# Patient Record
Sex: Male | Born: 1975 | Hispanic: No | Marital: Married | Smoking: Former smoker
Health system: Southern US, Community
[De-identification: ages and names within clinical notes are randomized; demographics above are authoritative.]

## PROBLEM LIST (undated history)

## (undated) DIAGNOSIS — E785 Hyperlipidemia, unspecified: Secondary | ICD-10-CM

## (undated) DIAGNOSIS — M259 Joint disorder, unspecified: Secondary | ICD-10-CM

## (undated) DIAGNOSIS — K603 Anal fistula: Secondary | ICD-10-CM

## (undated) HISTORY — DX: Hyperlipidemia, unspecified: E78.5

## (undated) HISTORY — PX: TREATMENT FISTULA ANAL: SUR1390

---

## 2002-03-16 ENCOUNTER — Emergency Department (HOSPITAL_COMMUNITY): Admission: EM | Admit: 2002-03-16 | Discharge: 2002-03-16 | Payer: Self-pay | Admitting: Emergency Medicine

## 2013-03-09 HISTORY — PX: RECTAL EXAM UNDER ANESTHESIA: SHX6399

## 2014-02-23 ENCOUNTER — Encounter: Payer: Self-pay | Admitting: Podiatry

## 2014-02-23 ENCOUNTER — Ambulatory Visit (INDEPENDENT_AMBULATORY_CARE_PROVIDER_SITE_OTHER): Payer: Self-pay | Admitting: Podiatry

## 2014-02-23 VITALS — BP 151/79 | HR 80 | Resp 13 | Ht 70.0 in | Wt 230.0 lb

## 2014-02-23 DIAGNOSIS — L03011 Cellulitis of right finger: Secondary | ICD-10-CM

## 2014-02-23 MED ORDER — CEPHALEXIN 500 MG PO CAPS: 500.0000 mg | ORAL_CAPSULE | Freq: Three times a day (TID) | ORAL | Status: DC

## 2014-02-23 NOTE — Patient Instructions (Signed)

## 2014-02-23 NOTE — Progress Notes (Signed)
Subjective:    Patient ID: Brad Hughes, male    DOB: 12/02/1975, 38 y.o.   MRN: [id number]  HPI 38 year old male presents the office today with complaints of right big toe ingrown toenail infection. Patient states that he has noticed some pus as well as redness overlying the inside border of the right big toenail over the last couple weeks. He has been states right to remove the ingrown toenail on his own. Denies any systemic complaints as fevers, chills, nausea, vomiting. The patient also was inquiring about fungal nail treatment due to thickness and discoloration the left big toe as well as the right fourth digit nails. He has been applying Fungi-Nail to the area. No other complaints at this time.   Review of Systems  All other systems reviewed and are negative.      Objective:   Physical Exam AAO x3, NAD DP/PT pulses palpable bilaterally, CRT less than 3 seconds Protective sensation intact with Simms Weinstein monofilament, vibratory sensation intact, Achilles tendon reflex intact There is evidence of ingrowing along the medial aspect of the right hallux nail. There is erythema along the medial nail border as well as purulence expressed from under the nail. There is no ascending cellulitis. No tenderness along the lateral nail border. The left hallux as well as a right fourth digit toenail is hypertrophic, discolored, brittle. No swelling erythema or drainage from the nail site. Remaining nails without pathology. There is a small dome-shaped soft tissue mass just proximal to the right first MTPJ. Subjectively the patient states that the area has Somewhat bigger over the last couple of years. Denies any pain associated with lesion. The color is homogeneous and has regular boarders. Overlying skin is intact. No open lesions or pre-ulcerative lesions. No areas of pinpoint bony tenderness or pain with vibratory sensation. MMT 5/5, ROM WNL     Assessment & Plan:  38 year old male right  medial hallux paronychia; onychomycosis of left hallux and right fourth digit nail -Conservative versus surgical options were discussed the patient including alternatives, risks, complications. -At this time due to infection recommended a partial nail avulsion without chemical matricectomy of the right medial nail border of the hallux. Patient probably can since the procedure. Under sterile conditions a total of 2.5 mL of a one-to-one mixture 2% lidocaine plain and 0.5% Marcaine plain was infiltrated in a hallux block fashion the right foot. Once anesthetized, the skin was prepped in sterile fashion. Next the medial nail border of the right hallux is sharply excised making sure to remove the entire offending nail border. There is found to be an extensive amount of ingrowing along the nail border. There is purulence expressed. Once the nail was removed and the area was debrided no further purulence was expressed and the underlying skin was intact. The area was then copiously irrigated. Silvadene was applied followed by dry sterile dressing. After application a dressing the tourniquet was removed and there is found to be an immediate capillary refill time is digit. Patient tolerated the procedure well without any complications. Postoperative instructions were discussed the patient for which she verbally understood. Prescribed Keflex. Continue to monitor for any clinical signs or symptoms of worsening infection and directed to call the office immediately should any occur or go directly to the emergency room. -Due to increased size of the mass on the proximal right first MTPJ recommended biopsy of the area however we will hold off due to infection of the toe. -Follow-up in 1 week or sooner should  he problems arise. In the meantime, call the office with any questions, concerns, change in symptoms.

## 2014-02-26 ENCOUNTER — Encounter: Payer: Self-pay | Admitting: Podiatry

## 2014-03-08 ENCOUNTER — Ambulatory Visit: Payer: Self-pay | Admitting: Podiatry

## 2014-10-18 ENCOUNTER — Encounter: Payer: Self-pay | Admitting: Podiatry

## 2014-10-18 ENCOUNTER — Ambulatory Visit (INDEPENDENT_AMBULATORY_CARE_PROVIDER_SITE_OTHER): Payer: Self-pay | Admitting: Podiatry

## 2014-10-18 VITALS — BP 106/69 | HR 74 | Resp 16

## 2014-10-18 DIAGNOSIS — L600 Ingrowing nail: Secondary | ICD-10-CM

## 2014-10-18 NOTE — Progress Notes (Signed)
Subjective:     Patient ID: Brad Hughes, male   DOB: 05/03/1975, 39 y.o.   MRN: [id number]  HPI patient states my right big toenail medial border is very tender and it was taken care of with a temporary procedure last year and I need it fixed permanently   Review of Systems     Objective:   Physical Exam Neurovascular status intact with incurvated right hallux medial border that's painful when pressed    Assessment:     Ingrown toenail deformity right hallux medial border with pain    Plan:     Reviewed condition and discussed permanent procedure to fix the corner. Explained risk and patient wants surgery and today I infiltrated 60 mg I can Marcaine mixture remove the medial corner exposed matrix and applied phenol 3 applications 30 seconds followed by alcohol lavage and sterile dressing. Gave instructions on soaks and reappoint

## 2014-10-18 NOTE — Patient Instructions (Signed)

## 2014-10-22 ENCOUNTER — Telehealth: Payer: Self-pay | Admitting: *Deleted

## 2014-10-22 NOTE — Telephone Encounter (Signed)
Left message at [phone] (Home #) for patient to call me back regarding their ingrown toenail procedure that was done on Thursday, October 18, 2014. Waiting for response from patient.

## 2015-03-10 DIAGNOSIS — K603 Anal fistula, unspecified: Secondary | ICD-10-CM

## 2015-03-10 HISTORY — DX: Anal fistula: K60.3

## 2015-03-10 HISTORY — DX: Anal fistula, unspecified: K60.30

## 2015-09-07 DIAGNOSIS — M259 Joint disorder, unspecified: Secondary | ICD-10-CM

## 2015-09-07 HISTORY — DX: Joint disorder, unspecified: M25.9

## 2015-11-12 ENCOUNTER — Other Ambulatory Visit: Payer: Self-pay | Admitting: General Surgery

## 2015-12-23 ENCOUNTER — Encounter (HOSPITAL_BASED_OUTPATIENT_CLINIC_OR_DEPARTMENT_OTHER): Payer: Self-pay | Admitting: *Deleted

## 2015-12-23 NOTE — Progress Notes (Signed)
[phone]-Instructed Npo after Mn.

## 2015-12-25 ENCOUNTER — Encounter (HOSPITAL_BASED_OUTPATIENT_CLINIC_OR_DEPARTMENT_OTHER): Payer: Self-pay | Admitting: *Deleted

## 2015-12-26 ENCOUNTER — Ambulatory Visit (HOSPITAL_BASED_OUTPATIENT_CLINIC_OR_DEPARTMENT_OTHER): Payer: Self-pay | Admitting: Anesthesiology

## 2015-12-26 ENCOUNTER — Ambulatory Visit (HOSPITAL_BASED_OUTPATIENT_CLINIC_OR_DEPARTMENT_OTHER)
Admission: RE | Admit: 2015-12-26 | Discharge: 2015-12-26 | Disposition: A | Discharge status: Home / Self Care | Payer: Self-pay | Source: Ambulatory Visit | Attending: General Surgery | Admitting: General Surgery

## 2015-12-26 ENCOUNTER — Encounter (HOSPITAL_BASED_OUTPATIENT_CLINIC_OR_DEPARTMENT_OTHER): Payer: Self-pay

## 2015-12-26 ENCOUNTER — Encounter (HOSPITAL_BASED_OUTPATIENT_CLINIC_OR_DEPARTMENT_OTHER)
Admission: RE | Disposition: A | Discharge status: Home / Self Care | Payer: Self-pay | Source: Ambulatory Visit | Attending: General Surgery

## 2015-12-26 DIAGNOSIS — K603 Anal fistula: Secondary | ICD-10-CM | POA: Insufficient documentation

## 2015-12-26 DIAGNOSIS — Z87891 Personal history of nicotine dependence: Secondary | ICD-10-CM | POA: Insufficient documentation

## 2015-12-26 DIAGNOSIS — Z86010 Personal history of colonic polyps: Secondary | ICD-10-CM | POA: Insufficient documentation

## 2015-12-26 DIAGNOSIS — E7800 Pure hypercholesterolemia, unspecified: Secondary | ICD-10-CM | POA: Insufficient documentation

## 2015-12-26 HISTORY — PX: ANAL FISTULOTOMY: SHX6423

## 2015-12-26 HISTORY — PX: RECTAL EXAM UNDER ANESTHESIA: SHX6399

## 2015-12-26 HISTORY — DX: Anal fistula: K60.3

## 2015-12-26 HISTORY — DX: Joint disorder, unspecified: M25.9

## 2015-12-26 LAB — POCT HEMOGLOBIN-HEMACUE: Hemoglobin: 14 g/dL (ref 13.0–17.0)

## 2015-12-26 SURGERY — EXAM UNDER ANESTHESIA, RECTUM
Anesthesia: Monitor Anesthesia Care | Site: Rectum

## 2015-12-26 MED ORDER — FENTANYL CITRATE (PF) 100 MCG/2ML IJ SOLN
25.0000 ug | INTRAMUSCULAR | Status: DC | PRN
  Filled 2015-12-26: qty 1

## 2015-12-26 MED ORDER — SODIUM CHLORIDE 0.9% FLUSH
3.0000 mL | Freq: Two times a day (BID) | INTRAVENOUS | Status: DC
  Filled 2015-12-26: qty 3

## 2015-12-26 MED ORDER — DEXAMETHASONE SODIUM PHOSPHATE 10 MG/ML IJ SOLN
INTRAMUSCULAR | Status: AC
  Filled 2015-12-26: qty 1

## 2015-12-26 MED ORDER — PROPOFOL 500 MG/50ML IV EMUL
INTRAVENOUS | Status: DC | PRN
  Administered 2015-12-26: 50 ug/kg/min via INTRAVENOUS

## 2015-12-26 MED ORDER — ACETAMINOPHEN 650 MG RE SUPP
650.0000 mg | RECTAL | Status: DC | PRN
  Filled 2015-12-26: qty 1

## 2015-12-26 MED ORDER — MIDAZOLAM HCL 2 MG/2ML IJ SOLN
INTRAMUSCULAR | Status: AC
  Filled 2015-12-26: qty 2

## 2015-12-26 MED ORDER — KETOROLAC TROMETHAMINE 30 MG/ML IJ SOLN
INTRAMUSCULAR | Status: DC | PRN
  Administered 2015-12-26: 30 mg via INTRAVENOUS

## 2015-12-26 MED ORDER — OXYCODONE HCL 5 MG PO TABS
5.0000 mg | ORAL_TABLET | ORAL | Status: DC | PRN
  Filled 2015-12-26: qty 2

## 2015-12-26 MED ORDER — KETOROLAC TROMETHAMINE 30 MG/ML IJ SOLN
INTRAMUSCULAR | Status: AC
  Filled 2015-12-26: qty 1

## 2015-12-26 MED ORDER — PROPOFOL 500 MG/50ML IV EMUL
INTRAVENOUS | Status: AC
  Filled 2015-12-26: qty 50

## 2015-12-26 MED ORDER — ONDANSETRON HCL 4 MG/2ML IJ SOLN
INTRAMUSCULAR | Status: DC | PRN
  Administered 2015-12-26: 4 mg via INTRAVENOUS

## 2015-12-26 MED ORDER — FENTANYL CITRATE (PF) 100 MCG/2ML IJ SOLN
INTRAMUSCULAR | Status: AC
  Filled 2015-12-26: qty 2

## 2015-12-26 MED ORDER — HYDROCODONE-ACETAMINOPHEN 7.5-325 MG PO TABS
1.0000 | ORAL_TABLET | Freq: Once | ORAL | Status: DC | PRN
  Filled 2015-12-26: qty 1

## 2015-12-26 MED ORDER — PROPOFOL 10 MG/ML IV BOLUS
INTRAVENOUS | Status: DC | PRN
  Administered 2015-12-26: 20 mg via INTRAVENOUS

## 2015-12-26 MED ORDER — FENTANYL CITRATE (PF) 100 MCG/2ML IJ SOLN
INTRAMUSCULAR | Status: DC | PRN
  Administered 2015-12-26: 25 ug via INTRAVENOUS
  Administered 2015-12-26: 50 ug via INTRAVENOUS
  Administered 2015-12-26: 25 ug via INTRAVENOUS

## 2015-12-26 MED ORDER — SODIUM CHLORIDE 0.9% FLUSH
3.0000 mL | INTRAVENOUS | Status: DC | PRN
  Filled 2015-12-26: qty 3

## 2015-12-26 MED ORDER — LIDOCAINE 5 % EX OINT
TOPICAL_OINTMENT | CUTANEOUS | Status: DC | PRN
  Administered 2015-12-26: 1

## 2015-12-26 MED ORDER — BUPIVACAINE-EPINEPHRINE 0.5% -1:200000 IJ SOLN
INTRAMUSCULAR | Status: DC | PRN
  Administered 2015-12-26: 50 mL

## 2015-12-26 MED ORDER — ACETAMINOPHEN 325 MG PO TABS
650.0000 mg | ORAL_TABLET | ORAL | Status: DC | PRN
  Filled 2015-12-26: qty 2

## 2015-12-26 MED ORDER — DEXAMETHASONE SODIUM PHOSPHATE 4 MG/ML IJ SOLN
INTRAMUSCULAR | Status: DC | PRN
  Administered 2015-12-26: 5 mg via INTRAVENOUS

## 2015-12-26 MED ORDER — ONDANSETRON HCL 4 MG/2ML IJ SOLN
INTRAMUSCULAR | Status: AC
  Filled 2015-12-26: qty 2

## 2015-12-26 MED ORDER — LIDOCAINE 2% (20 MG/ML) 5 ML SYRINGE
INTRAMUSCULAR | Status: AC
  Filled 2015-12-26: qty 5

## 2015-12-26 MED ORDER — HYDROCODONE-ACETAMINOPHEN 5-325 MG PO TABS: 1.0000 | ORAL_TABLET | ORAL | 0 refills | Status: DC | PRN

## 2015-12-26 MED ORDER — LACTATED RINGERS IV SOLN
INTRAVENOUS | Status: DC
  Administered 2015-12-26: 08:00:00 via INTRAVENOUS
  Filled 2015-12-26: qty 1000

## 2015-12-26 MED ORDER — MIDAZOLAM HCL 5 MG/5ML IJ SOLN
INTRAMUSCULAR | Status: DC | PRN
  Administered 2015-12-26: 2 mg via INTRAVENOUS

## 2015-12-26 MED ORDER — LIDOCAINE HCL (CARDIAC) 20 MG/ML IV SOLN
INTRAVENOUS | Status: DC | PRN
  Administered 2015-12-26: 50 mg via INTRAVENOUS

## 2015-12-26 MED ORDER — SODIUM CHLORIDE 0.9 % IV SOLN
250.0000 mL | INTRAVENOUS | Status: DC | PRN
  Filled 2015-12-26: qty 250

## 2015-12-26 SURGICAL SUPPLY — 56 items
APL SKNCLS STERI-STRIP NONHPOA (GAUZE/BANDAGES/DRESSINGS) ×2
BENZOIN TINCTURE PRP APPL 2/3 (GAUZE/BANDAGES/DRESSINGS) ×4 IMPLANT
BLADE HEX COATED 2.75 (ELECTRODE) ×4 IMPLANT
BLADE SURG 10 STRL SS (BLADE) ×4 IMPLANT
BLADE SURG 15 STRL LF DISP TIS (BLADE) ×2 IMPLANT
BLADE SURG 15 STRL SS (BLADE) ×4
BRIEF STRETCH FOR OB PAD LRG (UNDERPADS AND DIAPERS) ×8 IMPLANT
CANISTER SUCTION 2500CC (MISCELLANEOUS) ×4 IMPLANT
COVER BACK TABLE 60X90IN (DRAPES) ×4 IMPLANT
COVER MAYO STAND STRL (DRAPES) ×4 IMPLANT
DECANTER SPIKE VIAL GLASS SM (MISCELLANEOUS) ×4 IMPLANT
DRAPE LAPAROTOMY 100X72 PEDS (DRAPES) ×4 IMPLANT
DRAPE LG THREE QUARTER DISP (DRAPES) ×8 IMPLANT
DRAPE UNDERBUTTOCKS STRL (DRAPE) IMPLANT
DRAPE UTILITY XL STRL (DRAPES) ×4 IMPLANT
DRSG PAD ABDOMINAL 8X10 ST (GAUZE/BANDAGES/DRESSINGS) ×2 IMPLANT
ELECT BLADE 6.5 .24CM SHAFT (ELECTRODE) IMPLANT
ELECT REM PT RETURN 9FT ADLT (ELECTROSURGICAL) ×4
ELECTRODE REM PT RTRN 9FT ADLT (ELECTROSURGICAL) ×2 IMPLANT
GAUZE SPONGE 4X4 12PLY STRL (GAUZE/BANDAGES/DRESSINGS) ×2 IMPLANT
GAUZE SPONGE 4X4 16PLY XRAY LF (GAUZE/BANDAGES/DRESSINGS) IMPLANT
GAUZE VASELINE 3X9 (GAUZE/BANDAGES/DRESSINGS) IMPLANT
GLOVE BIO SURGEON STRL SZ 6.5 (GLOVE) ×6 IMPLANT
GLOVE BIO SURGEONS STRL SZ 6.5 (GLOVE) ×2
GLOVE INDICATOR 7.0 STRL GRN (GLOVE) ×8 IMPLANT
GOWN STRL REUS W/ TWL LRG LVL3 (GOWN DISPOSABLE) ×2 IMPLANT
GOWN STRL REUS W/ TWL XL LVL3 (GOWN DISPOSABLE) ×4 IMPLANT
GOWN STRL REUS W/TWL LRG LVL3 (GOWN DISPOSABLE) ×4
GOWN STRL REUS W/TWL XL LVL3 (GOWN DISPOSABLE) ×8
KIT ROOM TURNOVER WOR (KITS) ×4 IMPLANT
LEGGING LITHOTOMY PAIR STRL (DRAPES) IMPLANT
LOOP VESSEL MAXI BLUE (MISCELLANEOUS) IMPLANT
NDL SAFETY ECLIPSE 18X1.5 (NEEDLE) IMPLANT
NEEDLE HYPO 18GX1.5 SHARP (NEEDLE)
NEEDLE HYPO 22GX1.5 SAFETY (NEEDLE) ×4 IMPLANT
NS IRRIG 500ML POUR BTL (IV SOLUTION) ×4 IMPLANT
PACK BASIN DAY SURGERY FS (CUSTOM PROCEDURE TRAY) ×4 IMPLANT
PAD ABD 8X10 STRL (GAUZE/BANDAGES/DRESSINGS) IMPLANT
PAD ARMBOARD 7.5X6 YLW CONV (MISCELLANEOUS) IMPLANT
PENCIL BUTTON HOLSTER BLD 10FT (ELECTRODE) ×4 IMPLANT
SPONGE GAUZE 4X4 12PLY (GAUZE/BANDAGES/DRESSINGS) IMPLANT
SPONGE SURGIFOAM ABS GEL 12-7 (HEMOSTASIS) IMPLANT
SUT CHROMIC 2 0 SH (SUTURE) IMPLANT
SUT CHROMIC 3 0 SH 27 (SUTURE) IMPLANT
SUT ETHIBOND 0 (SUTURE) IMPLANT
SUT GUT CHROMIC 3 0 (SUTURE) IMPLANT
SUT SILK 3 0 SH CR/8 (SUTURE) ×4 IMPLANT
SUT VIC AB 4-0 P-3 18XBRD (SUTURE) IMPLANT
SUT VIC AB 4-0 P3 18 (SUTURE)
SUT VICRYL 3-0 CR8 SH (SUTURE) ×4 IMPLANT
SYR CONTROL 10ML LL (SYRINGE) ×4 IMPLANT
TOWEL OR 17X24 6PK STRL BLUE (TOWEL DISPOSABLE) ×4 IMPLANT
TRAY DSU PREP LF (CUSTOM PROCEDURE TRAY) ×4 IMPLANT
TUBE CONNECTING 12'X1/4 (SUCTIONS) ×1
TUBE CONNECTING 12X1/4 (SUCTIONS) ×3 IMPLANT
YANKAUER SUCT BULB TIP NO VENT (SUCTIONS) ×4 IMPLANT

## 2015-12-26 NOTE — Anesthesia Postprocedure Evaluation (Signed)
Anesthesia Post Note  Patient: Neco F Patino  Procedure(s) Performed: Procedure(s) (LRB): RECTAL EXAM UNDER ANESTHESIA (N/A) FISTULOTOMY (N/A)  Patient location during evaluation: PACU Anesthesia Type: MAC Level of consciousness: awake and alert Pain management: pain level controlled Vital Signs Assessment: post-procedure vital signs reviewed and stable Respiratory status: spontaneous breathing, nonlabored ventilation, respiratory function stable and patient connected to nasal cannula oxygen Cardiovascular status: stable and blood pressure returned to baseline Anesthetic complications: no    Last Vitals:  Vitals:   12/26/15 0905 12/26/15 1049  BP: 125/73 130/74  Pulse: 80 73  Resp: 18 (!) 22  Temp: 36.4 C     Last Pain:  Vitals:   12/26/15 0710  TempSrc: Oral                 Kennieth RadFitzgerald, Paulene Tayag E

## 2015-12-26 NOTE — Op Note (Signed)
12/26/2015  8:58 AM  PATIENT:  Brad Hughes  [age] y.o. male  Patient Care Team: No Pcp Per Patient as PCP - General (General Practice)  PRE-OPERATIVE DIAGNOSIS:  Anal fistula  POST-OPERATIVE DIAGNOSIS:  Anal fistula  PROCEDURE:  RECTAL EXAM UNDER ANESTHESIA FISTULOTOMY   Surgeon(s): Romie LeveeAlicia Tatyana Biber, MD  ASSISTANT: none   ANESTHESIA:   local and MAC  SPECIMEN:  No Specimen  DISPOSITION OF SPECIMEN:  N/A  COUNTS:  YES  PLAN OF CARE: Discharge to home after PACU  PATIENT DISPOSITION:  PACU - hemodynamically stable.  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   OR FINDINGS: Shallow intersphincteric fistula, left lateral perianal region  DESCRIPTION: the patient was identified in the preoperative holding area and taken to the OR where they were laid on the operating room table.  MAC anesthesia was induced without difficulty. The patient was then positioned in prone jackknife position with buttocks gently taped apart.  The patient was then prepped and draped in usual sterile fashion.  SCDs were noted to be in place prior to the initiation of anesthesia. A surgical timeout was performed indicating the correct patient, procedure, positioning and need for preoperative antibiotics.  A rectal block was performed using Marcaine with epinephrine.    I began with a digital rectal exam.  There was an obvious defect noted in the left lateral perianal region consistent with chronic fistula.  I then placed a Hill-Ferguson anoscope into the anal canal and evaluated this completely.  Anal canal was relatively normal. There was no specter hypertension. There was mild hemorrhoid disease. I placed an S-shaped fistula probe into the external opening of this easily exited inside the anal canal in a radial fashion. It did not involve any external sphincter. I decided to perform a fistulotomy. This was done using electrocautery. The edges  of the fistulotomy site were then marsupialized using a running 2-0 chromic suture. Hemostasis was good at the end of the case. All counts were correct per operating room staff. The patient was sent to the postanesthesia care unit in stable condition.

## 2015-12-26 NOTE — H&P (Signed)
The patient is a 40 year old male who presents with anal fistula. 40 year old male with approximate 5 year history of anal pain and drainage. He had one surgery for this approximately 5 years ago where the area was opened and allowed to heal by secondary intention. It is unclear if the fistulotomy was performed as well. Since that time he has had continued pain and drainage in the area. This occurs on a daily basis. It is relieved with ibuprofen. Patient denies any history of incontinence currently. He has occasional loose stools.   Other Problems Michel Bickers(Kelly Dockery, LPN; 1/6/10969/07/2015 0:459:09 AM) Back Pain Hemorrhoids Hypercholesterolemia  Past Surgical History Michel Bickers(Kelly Dockery, LPN; 4/0/98119/07/2015 9:149:09 AM) Anal Fissure Repair Colon Polyp Removal - Colonoscopy  Diagnostic Studies History Michel Bickers(Kelly Dockery, LPN; 7/8/29569/07/2015 2:139:09 AM) Colonoscopy 5-10 years ago  Allergies Michel Bickers(Kelly Dockery, LPN; 0/8/65789/07/2015 4:699:10 AM) No Known Drug Allergies09/07/2015  Medication History Michel Bickers(Kelly Dockery, LPN; 6/2/95289/07/2015 4:139:10 AM) Ibuprofen (200MG  Capsule, Oral as needed) Active. Medications Reconciled  Social History Michel Bickers(Kelly Dockery, LPN; 2/4/40109/07/2015 2:729:09 AM) Alcohol use Remotely quit alcohol use. Caffeine use Carbonated beverages, Coffee, Tea. Illicit drug use Remotely quit drug use. Tobacco use Former smoker.  Family History Michel Bickers(Kelly Dockery, LPN; 5/3/66449/07/2015 0:349:09 AM) Alcohol Abuse Brother. Diabetes Mellitus Brother, Father, Mother.    Review of Systems  General Present- Fatigue and Weight Gain. Not Present- Appetite Loss, Chills, Fever, Night Sweats and Weight Loss. Skin Present- Change in Wart/Mole. Not Present- Dryness, Hives, Jaundice, New Lesions, Non-Healing Wounds, Rash and Ulcer. HEENT Not Present- Earache, Hearing Loss, Hoarseness, Nose Bleed, Oral Ulcers, Ringing in the Ears, Seasonal Allergies, Sinus Pain, Sore Throat, Visual Disturbances, Wears glasses/contact lenses and Yellow Eyes. Respiratory  Present- Snoring. Not Present- Bloody sputum, Chronic Cough, Difficulty Breathing and Wheezing. Breast Not Present- Breast Mass, Breast Pain, Nipple Discharge and Skin Changes. Cardiovascular Present- Shortness of Breath. Not Present- Chest Pain, Difficulty Breathing Lying Down, Leg Cramps, Palpitations, Rapid Heart Rate and Swelling of Extremities. Gastrointestinal Present- Bloody Stool, Constipation, Difficulty Swallowing, Hemorrhoids and Rectal Pain. Not Present- Abdominal Pain, Bloating, Change in Bowel Habits, Chronic diarrhea, Excessive gas, Gets full quickly at meals, Indigestion, Nausea and Vomiting. Male Genitourinary Present- Impotence. Not Present- Blood in Urine, Change in Urinary Stream, Frequency, Nocturia, Painful Urination, Urgency and Urine Leakage. Musculoskeletal Present- Muscle Weakness and Swelling of Extremities. Not Present- Back Pain, Joint Pain, Joint Stiffness and Muscle Pain. Neurological Present- Numbness. Not Present- Decreased Memory, Fainting, Headaches, Seizures, Tingling, Tremor, Trouble walking and Weakness. Psychiatric Not Present- Anxiety, Bipolar, Change in Sleep Pattern, Depression, Fearful and Frequent crying. Endocrine Not Present- Cold Intolerance, Excessive Hunger, Hair Changes, Heat Intolerance, Hot flashes and New Diabetes. Hematology Not Present- Blood Thinners, Easy Bruising, Excessive bleeding, Gland problems, HIV and Persistent Infections.  BP 122/78   Pulse 72   Temp 97.7 F (36.5 C) (Oral)   Resp 20   Ht 5\' 10"  (1.778 m)   Wt 120.2 kg (265 lb)   SpO2 99%   BMI 38.02 kg/m    Physical Exam  General Mental Status-Alert. General Appearance-Not in acute distress. Build & Nutrition-Well nourished. Posture-Normal posture. Gait-Normal.  Head and Neck Head-normocephalic, atraumatic with no lesions or palpable masses. Trachea-midline.  Chest and Lung Exam Chest and lung exam reveals -on auscultation, normal breath sounds, no  adventitious sounds and normal vocal resonance.  Cardiovascular Cardiovascular examination reveals -normal heart sounds, regular rate and rhythm with no murmurs.  Abdomen Inspection Inspection of the abdomen reveals - No Hernias. Palpation/Percussion Palpation  and Percussion of the abdomen reveal - Soft, Non Tender, No Rigidity (guarding), No hepatosplenomegaly and No Palpable abdominal masses.  Rectal Anorectal Exam External - Note: left lateral anal lesion c/w fistula.  Neurologic Neurologic evaluation reveals -alert and oriented x 3 with no impairment of recent or remote memory, normal attention span and ability to concentrate, normal sensation and normal coordination.  Musculoskeletal Normal Exam - Bilateral-Upper Extremity Strength Normal and Lower Extremity Strength Normal.    Assessment & Plan ANAL FISTULA (K60.3) Impression: 40 year old male who presents to the office with approximately 5 year history of perianal pain and drainage. On exam he appears to have a left lateral fistula site. I have recommended an exam under anesthesia and fistulotomy versus seton placement. We have discussed this in detail. He understands there is a small risk of incontinence if a fistultomy is performed.

## 2015-12-26 NOTE — Discharge Instructions (Addendum)
Post Anesthesia Home Care Instructions  Activity: Get plenty of rest for the remainder of the day. A responsible adult should stay with you for 24 hours following the procedure.  For the next 24 hours, DO NOT: -Drive a car -Operate machinery -Drink alcoholic beverages -Take any medication unless instructed by your physician -Make any legal decisions or sign important papers.  Meals: Start with liquid foods such as gelatin or soup. Progress to regular foods as tolerated. Avoid greasy, spicy, heavy foods. If nausea and/or vomiting occur, drink only clear liquids until the nausea and/or vomiting subsides. Call your physician if vomiting continues.  Special Instructions/Symptoms: Your throat may feel dry or sore from the anesthesia or the breathing tube placed in your throat during surgery. If this causes discomfort, gargle with warm salt water. The discomfort should disappear within 24 hours.  If you had a scopolamine patch placed behind your ear for the management of post- operative nausea and/or vomiting:  1. The medication in the patch is effective for 72 hours, after which it should be removed.  Wrap patch in a tissue and discard in the trash. Wash hands thoroughly with soap and water. 2. You may remove the patch earlier than 72 hours if you experience unpleasant side effects which may include dry mouth, dizziness or visual disturbances. 3. Avoid touching the patch. Wash your hands with soap and water after contact with the patch.   ANORECTAL SURGERY: POST OP INSTRUCTIONS 1. Take your usually prescribed home medications unless otherwise directed. 2. DIET: During the first few hours after surgery sip on some liquids until you are able to urinate.  It is normal to not urinate for several hours after this surgery.  If you feel uncomfortable, please contact the office for instructions.  After you are able to urinate,you may eat, if you feel like it.  Follow a light bland diet the first 24  hours after arrival home, such as soup, liquids, crackers, etc.  Be sure to include lots of fluids daily (6-8 glasses).  Avoid fast food or heavy meals, as your are more likely to get nauseated.  Eat a low fat diet the next few days after surgery.  Limit caffeine intake to 1-2 servings a day. 3. PAIN CONTROL: a. Pain is best controlled by a usual combination of several different methods TOGETHER: i. Muscle relaxation: Soak in a warm bath (or Sitz bath) three times a day and after bowel movements.  Continue to do this until all pain is resolved. ii. Over the counter pain medication iii. Prescription pain medication b. Most patients will experience some swelling and discomfort in the anus/rectal area and incisions.  Heat such as warm towels, sitz baths, warm baths, etc to help relax tight/sore spots and speed recovery.  Some people prefer to use ice, especially in the first couple days after surgery, as it may decrease the pain and swelling, or alternate between ice & heat.  Experiment to what works for you.  Swelling and bruising can take several weeks to resolve.  Pain can take even longer to completely resolve. c. It is helpful to take an over-the-counter pain medication regularly for the first few weeks.  Choose one of the following that works best for you: i. Naproxen (Aleve, etc)  Two 220mg tabs twice a day ii. Ibuprofen (Advil, etc) Three 200mg tabs four times a day (every meal & bedtime) d. A  prescription for pain medication (such as percocet, oxycodone, hydrocodone, etc) should be given to you upon   discharge.  Take your pain medication as prescribed.  i. If you are having problems/concerns with the prescription medicine (does not control pain, nausea, vomiting, rash, itching, etc), please call us [phone] to see if we need to switch you to a different pain medicine that will work better for you and/or control your side effect better. ii. If you need a refill on your pain medication, please  contact your pharmacy.  They will contact our office to request authorization. Prescriptions will not be filled after 5 pm or on week-ends. 4. KEEP YOUR BOWELS REGULAR and AVOID CONSTIPATION a. The goal is one to two soft bowel movements a day.  You should at least have a bowel movement every other day. b. Avoid getting constipated.  Between the surgery and the pain medications, it is common to experience some constipation. This can be very painful after rectal surgery.  Increasing fluid intake and taking a fiber supplement (such as Metamucil, Citrucel, FiberCon, etc) 1-2 times a day regularly will usually help prevent this problem from occurring.  A stool softener like colace is also recommended.  This can be purchased over the counter at your pharmacy.  You can take it up to 3 times a day.  If you do not have a bowel movement after 24 hrs since your surgery, take one does of milk of magnesia.  If you still haven't had a bowel movement 8-12 hours after that dose, take another dose.  If you don't have a bowel movement 48 hrs after surgery, purchase a Fleets enema from the drug store and administer gently per package instructions.  If you still are having trouble with your bowel movements after that, please call the office for further instructions. c. If you develop diarrhea or have many loose bowel movements, simplify your diet to bland foods & liquids for a few days.  Stop any stool softeners and decrease your fiber supplement.  Switching to mild anti-diarrheal medications (Kayopectate, Pepto Bismol) can help.  If this worsens or does not improve, please call us.  5. Wound Care a. Remove your bandages before your first bowel movement or 8 hours after surgery.     b. Remove any wound packing material at this tim,e as well.  You do not need to repack the wound unless instructed otherwise.  Wear an absorbent pad or soft cotton gauze in your underwear to catch any drainage and help keep the area clean. You  should change this every 2-3 hours while awake. c. Keep the area clean and dry.  Bathe / shower every day, especially after bowel movements.  Keep the area clean by showering / bathing over the incision / wound.   It is okay to soak an open wound to help wash it.  Wet wipes or showers / gentle washing after bowel movements is often less traumatic than regular toilet paper. d. You may have some styrofoam-like soft packing in the rectum which will come out with the first bowel movement.  e. You will often notice bleeding with bowel movements.  This should slow down by the end of the first week of surgery f. Expect some drainage.  This should slow down, too, by the end of the first week of surgery.  Wear an absorbent pad or soft cotton gauze in your underwear until the drainage stops. g. Do Not sit on a rubber or pillow ring.  This can make you symptoms worse.  You may sit on a soft pillow if needed.  6.   ACTIVITIES as tolerated:   a. You may resume regular (light) daily activities beginning the next day--such as daily self-care, walking, climbing stairs--gradually increasing activities as tolerated.  If you can walk 30 minutes without difficulty, it is safe to try more intense activity such as jogging, treadmill, bicycling, low-impact aerobics, swimming, etc. b. Save the most intensive and strenuous activity for last such as sit-ups, heavy lifting, contact sports, etc  Refrain from any heavy lifting or straining until you are off narcotics for pain control.   c. You may drive when you are no longer taking prescription pain medication, you can comfortably sit for long periods of time, and you can safely maneuver your car and apply brakes. d. You may have sexual intercourse when it is comfortable.  7. FOLLOW UP in our office a. Please call [facility] at [phone] to set up an appointment to see your surgeon in the office for a follow-up appointment approximately 3-4 weeks after your surgery. b. Make sure that  you call for this appointment the day you arrive home to insure a convenient appointment time. 10. IF YOU HAVE DISABILITY OR FAMILY LEAVE FORMS, BRING THEM TO THE OFFICE FOR PROCESSING.  DO NOT GIVE THEM TO YOUR DOCTOR.     WHEN TO CALL US [phone]: 1. Poor pain control 2. Reactions / problems with new medications (rash/itching, nausea, etc)  3. Fever over 101.5 F (38.5 C) 4. Inability to urinate 5. Nausea and/or vomiting 6. Worsening swelling or bruising 7. Continued bleeding from incision. 8. Increased pain, redness, or drainage from the incision  The clinic staff is available to answer your questions during regular business hours (8:30am-5pm).  Please don't hesitate to call and ask to speak to one of our nurses for clinical concerns.   A surgeon from [facility] is always on call at the hospitals   If you have a medical emergency, go to the nearest emergency room or call 911.    [facility] [address], [address], [place], [place]  [address] ? MAIN: [phone] ? TOLL FREE: [phone] ? FAX [phone] [email or web address]    

## 2015-12-26 NOTE — Transfer of Care (Signed)
Immediate Anesthesia Transfer of Care Note  Patient: Brad Hughes  Procedure(s) Performed: Procedure(s) (LRB): RECTAL EXAM UNDER ANESTHESIA (N/A) FISTULOTOMY (N/A)  Patient Location: PACU  Anesthesia Type: MAC  Level of Consciousness: awake, sedated, patient cooperative and responds to stimulation  Airway & Oxygen Therapy: Patient Spontanous Breathing and Patient connected to face mask oxygen  Post-op Assessment: Report given to PACU RN, Post -op Vital signs reviewed and stable and Patient moving all extremities  Post vital signs: Reviewed and stable  Complications: No apparent anesthesia complications

## 2015-12-26 NOTE — Anesthesia Preprocedure Evaluation (Addendum)
Anesthesia Evaluation  Patient identified by MRN, date of birth, ID band Patient awake    Reviewed: Allergy & Precautions, NPO status , Patient's Chart, lab work & pertinent test results  Airway Mallampati: III  TM Distance: >3 FB Neck ROM: Full    Dental   Pulmonary former smoker,    breath sounds clear to auscultation       Cardiovascular  Rhythm:Regular Rate:Normal  +HLD   Neuro/Psych negative neurological ROS     GI/Hepatic negative GI ROS, Neg liver ROS,   Endo/Other  obese  Renal/GU negative Renal ROS     Musculoskeletal   Abdominal   Peds  Hematology negative hematology ROS (+)   Anesthesia Other Findings   Reproductive/Obstetrics                            Anesthesia Physical Anesthesia Plan  ASA: II  Anesthesia Plan: MAC   Post-op Pain Management:    Induction: Intravenous  Airway Management Planned: Natural Airway and Simple Face Mask  Additional Equipment:   Intra-op Plan:   Post-operative Plan:   Informed Consent: I have reviewed the patients History and Physical, chart, labs and discussed the procedure including the risks, benefits and alternatives for the proposed anesthesia with the patient or authorized representative who has indicated his/her understanding and acceptance.   Dental advisory given  Plan Discussed with: CRNA  Anesthesia Plan Comments:         Anesthesia Quick Evaluation

## 2015-12-26 NOTE — Anesthesia Procedure Notes (Signed)
Procedure Name: MAC Performed by: Marcene Duos Pre-anesthesia Checklist: Patient identified, Emergency Drugs available, Suction available, Patient being monitored and Timeout performed Patient Re-evaluated:Patient Re-evaluated prior to inductionOxygen Delivery Method: Simple face mask Preoxygenation: Pre-oxygenation with 100% oxygen Intubation Type: IV induction Placement Confirmation: positive ETCO2 and breath sounds checked- equal and bilateral

## 2015-12-26 NOTE — Transfer of Care (Deleted)
   Last Pain:  Vitals:   12/26/15 0710  TempSrc: Oral      Immediate Anesthesia Transfer of Care Note  Patient: Brad Hughes  Procedure(s) Performed: Procedure(s) (LRB): RECTAL EXAM UNDER ANESTHESIA (N/A) FISTULOTOMY (N/A)  Patient Location: PACU  Anesthesia Type: General  Level of Consciousness: awake, sedated, patient cooperative and responds to stimulation  Airway & Oxygen Therapy: Patient Spontanous Breathing and Patient connected to face mask oxygen  Post-op Assessment: Report given to PACU RN, Post -op Vital signs reviewed and stable and Patient moving all extremities  Post vital signs: Reviewed and stable  Complications: No apparent anesthesia complications

## 2015-12-27 ENCOUNTER — Encounter (HOSPITAL_BASED_OUTPATIENT_CLINIC_OR_DEPARTMENT_OTHER): Payer: Self-pay | Admitting: General Surgery

## 2016-02-05 ENCOUNTER — Encounter (HOSPITAL_COMMUNITY): Payer: Self-pay | Admitting: *Deleted

## 2016-02-05 ENCOUNTER — Emergency Department (HOSPITAL_COMMUNITY)
Admission: EM | Admit: 2016-02-05 | Discharge: 2016-02-06 | Disposition: A | Discharge status: Home / Self Care | Payer: Self-pay | Attending: Emergency Medicine | Admitting: Emergency Medicine

## 2016-02-05 DIAGNOSIS — Z87891 Personal history of nicotine dependence: Secondary | ICD-10-CM | POA: Insufficient documentation

## 2016-02-05 DIAGNOSIS — J36 Peritonsillar abscess: Secondary | ICD-10-CM | POA: Insufficient documentation

## 2016-02-05 LAB — RAPID STREP SCREEN (MED CTR MEBANE ONLY): STREPTOCOCCUS, GROUP A SCREEN (DIRECT): NEGATIVE

## 2016-02-05 NOTE — ED Triage Notes (Signed)
Pt c/o sore throat x 2 days. Pt seen PCP, given amoxicillin. Pt reports taking one tablet and has had difficulty swallowing.

## 2016-02-06 MED ORDER — DEXAMETHASONE SODIUM PHOSPHATE 10 MG/ML IJ SOLN
10.0000 mg | Freq: Once | INTRAMUSCULAR | Status: AC
  Administered 2016-02-06: 10 mg via INTRAVENOUS
  Filled 2016-02-06: qty 1

## 2016-02-06 MED ORDER — HYDROCODONE-ACETAMINOPHEN 5-325 MG PO TABS
1.0000 | ORAL_TABLET | Freq: Once | ORAL | Status: AC
  Administered 2016-02-06: 1 via ORAL
  Filled 2016-02-06: qty 1

## 2016-02-06 MED ORDER — SODIUM CHLORIDE 0.9 % IV SOLN
INTRAVENOUS | Status: AC
  Administered 2016-02-06: 03:00:00 via INTRAVENOUS

## 2016-02-06 MED ORDER — SODIUM CHLORIDE 0.9 % IV SOLN
3.0000 g | Freq: Once | INTRAVENOUS | Status: AC
  Administered 2016-02-06: 3 g via INTRAVENOUS
  Filled 2016-02-06: qty 3

## 2016-02-06 NOTE — ED Provider Notes (Signed)
[facility]-EMERGENCY DEPT Provider Note   CSN: [id number] Arrival date & time: 02/05/16  2156     History   Chief Complaint Chief Complaint  Patient presents with  . Sore Throat    HPI Brad Hughes is a 40 y.o. male who presents to the ED with a sore throat that started a few days ago and has gotten worse. Patient reports that he saw his PCP earlier today and was prescribed Augmentin. He has been taking ibuprofen for pain without relief.   The history is provided by the patient.  Sore Throat  This is a new problem. The current episode started more than 2 days ago. The problem occurs constantly. The problem has been gradually worsening. Associated symptoms include headaches. Pertinent negatives include no abdominal pain and no shortness of breath. The symptoms are aggravated by swallowing and eating.    Past Medical History:  Diagnosis Date  . Anal fistula 2017  . Hyperlipidemia   . 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   Past Surgical History:  Procedure Laterality Date  . ANAL FISTULOTOMY N/A 12/26/2015   Procedure: FISTULOTOMY;  Surgeon: Romie LeveeAlicia Thomas, MD;  [facility] [place];  Service: General;  Laterality: N/A;  . RECTAL EXAM UNDER ANESTHESIA  2015  . RECTAL EXAM UNDER ANESTHESIA N/A 12/26/2015   Procedure: RECTAL EXAM UNDER ANESTHESIA;  Surgeon: Romie LeveeAlicia Thomas, MD;  [facility] [place];  Service: General;  Laterality: N/A;  . TREATMENT FISTULA ANAL         Home Medications    Prior to Admission medications   Medication Sig Start Date End Date Taking? Authorizing Provider  acetaminophen (TYLENOL) 325 MG tablet Take 650 mg by mouth every 6 (six) hours as needed.    Historical Provider, MD  cephALEXin (KEFLEX) 500 MG capsule Take 1 capsule (500 mg total) by mouth 3 (three) times daily. 02/23/14   Vivi BarrackMatthew R Wagoner, DPM    HYDROcodone-acetaminophen (NORCO/VICODIN) 5-325 MG tablet Take 1-2 tablets by mouth every 4 (four) hours as needed. 12/26/15   Romie LeveeAlicia Thomas, MD  ibuprofen (ADVIL,MOTRIN) 200 MG tablet Take 200 mg by mouth every 6 (six) hours as needed.    Historical Provider, MD  PRESCRIPTION MEDICATION Cholesterol medication    Historical Provider, MD    Family History No family history on file.  Social History Social History  Substance Use Topics  . Smoking status: Former Smoker    Types: Cigarettes    Quit date: 12/22/2008  . Smokeless tobacco: Never Used  . Alcohol use No     Allergies   Patient has no known allergies.   Review of Systems Review of Systems  Constitutional: Negative for chills and fever.  HENT: Positive for sore throat and voice change. Negative for dental problem, ear pain and trouble swallowing.   Eyes: Negative for pain, itching and visual disturbance.  Respiratory: Negative for cough and shortness of breath.   Gastrointestinal: Negative for abdominal pain, diarrhea, nausea and vomiting.  Musculoskeletal: Negative for back pain and neck stiffness.  Skin: Negative for rash.  Neurological: Positive for headaches.  Hematological: Positive for adenopathy.  Psychiatric/Behavioral: Negative for confusion.     Physical Exam Updated Vital Signs BP 125/77 (BP Location: Left Arm)   Pulse 97   Temp 98.9 F (37.2 C) (Oral)   Resp 16   Ht 5\' 10"  (1.778 m)  Wt 119.7 kg   SpO2 97%   BMI 37.88 kg/m   Physical Exam  Constitutional: He is oriented to person, place, and time. He appears well-developed and well-nourished.  HENT:  Head: Normocephalic.  Right Ear: Tympanic membrane normal.  Left Ear: Tympanic membrane normal.  Nose: Nose normal.  Mouth/Throat: Mucous membranes are normal. Posterior oropharyngeal erythema and tonsillar abscesses present.    Uvula deviated to the right, left tonsil enlarged with exudate c/w tonsillar abscess. Patient with muffled voice.    Eyes: EOM are normal.  Neck: Neck supple.  Cardiovascular: Normal rate.   Pulmonary/Chest: Effort normal. No respiratory distress.  Abdominal: Soft. There is no tenderness.  Musculoskeletal: Normal range of motion.  Lymphadenopathy:    He has cervical adenopathy.  Neurological: He is alert and oriented to person, place, and time. No cranial nerve deficit.  Skin: Skin is warm and dry.  Psychiatric: He has a normal mood and affect. His behavior is normal.  Nursing note and vitals reviewed.    ED Treatments / Results  Labs (all labs ordered are listed, but only abnormal results are displayed) Labs Reviewed  RAPID STREP SCREEN (NOT AT [facility])  CULTURE, GROUP A STREP [facility]   Radiology No results found.  Procedures Procedures (including critical care time)  Medications Ordered in ED Medications  dexamethasone (DECADRON) injection 10 mg (not administered)  0.9 %  sodium chloride infusion (not administered)  Ampicillin-Sulbactam (UNASYN) 3 g in sodium chloride 0.9 % 100 mL IVPB (not administered)  HYDROcodone-acetaminophen (NORCO/VICODIN) 5-325 MG per tablet 1 tablet (1 tablet Oral Given 02/06/16 0032)     Initial Impression / Assessment and Plan / ED Course  I have reviewed the triage vital signs and the nursing notes.  Clinical Course   Dr. Mora Bellmanni in to examine the patient and agrees with assessment.  Consult with ENT, Dr. Annalee GentaShoemaker. He request IV fluids, pain management, Decadron IV and f/u in the office later today for I&D.   Final Clinical Impressions(s) / ED Diagnoses   Final diagnoses:  Tonsillar abscess   Care turned over to Dr. Mora Bellmanni at 0230. Patient to see Dr. Annalee GentaShoemaker later today to drain the abscess.   New Prescriptions New Prescriptions   No medications on file     [facility] Alhassan Everingham, NP 02/06/16 [phone]    Tomasita CrumbleAdeleke Oni, MD 02/06/16 [phone]

## 2016-02-08 LAB — CULTURE, GROUP A STREP (THRC)

## 2019-08-15 ENCOUNTER — Institutional Professional Consult (permissible substitution): Payer: Self-pay | Admitting: Critical Care Medicine

## 2019-08-15 NOTE — Progress Notes (Deleted)
Synopsis: Referred in June 2021 for *** by No ref. provider found.  Subjective:   PATIENT ID: Brad Hughes GENDER: male DOB: 1975/08/14, MRN: [id number]  No chief complaint on file.   HPI  Preprocedure evaluation Has been referred for bariatric surgery  [facility] by neurology at [facility]   Past Medical History:  Diagnosis Date  . Anal fistula 2017  . Hyperlipidemia   . 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   Past Surgical History:  Procedure Laterality Date  . ANAL FISTULOTOMY N/A 12/26/2015   Procedure: FISTULOTOMY;  Surgeon: Leighton Ruff, MD;  Location[facility];  Service: General;  Laterality: N/A;  . RECTAL EXAM UNDER ANESTHESIA  2015  . RECTAL EXAM UNDER ANESTHESIA N/A 12/26/2015   Procedure: RECTAL EXAM UNDER ANESTHESIA;  Surgeon: Leighton Ruff, MD;  Location: [place];  Service: General;  Laterality: N/A;  . TREATMENT FISTULA ANAL      Social History   Socioeconomic History  . Marital status: Married    Spouse name: Not on file  . Number of children: Not on file  . Years of education: Not on file  . Highest education level: Not on file  Occupational History  . Not on file  Tobacco Use  . Smoking status: Former Smoker    Types: Cigarettes    Quit date: 12/22/2008    Years since quitting: 10.6  . Smokeless tobacco: Never Used  Substance and Sexual Activity  . Alcohol use: No  . Drug use: Not on file  . Sexual activity: Not on file  Other Topics Concern  . Not on file  Social History Narrative   ** Merged History Encounter **       Social Determinants of Health   Financial Resource Strain:   . Difficulty of Paying Living Expenses:   Food Insecurity:   . Worried About Charity fundraiser in the Last Year:   . Arboriculturist in the Last Year:   Transportation Needs:   . Film/video editor (Medical):   Marland Kitchen Lack of  Transportation (Non-Medical):   Physical Activity:   . Days of Exercise per Week:   . Minutes of Exercise per Session:   Stress:   . Feeling of Stress :   Social Connections:   . Frequency of Communication with Friends and Family:   . Frequency of Social Gatherings with Friends and Family:   . Attends Religious Services:   . Active Member of Clubs or Organizations:   . Attends Archivist Meetings:   Marland Kitchen Marital Status:   Intimate Partner Violence:   . Fear of Current or Ex-Partner:   . Emotionally Abused:   Marland Kitchen Physically Abused:   . Sexually Abused:      No Known Allergies    There is no immunization history on file for this patient.  Outpatient Medications Prior to Visit  Medication Sig Dispense Refill  . acetaminophen (TYLENOL) 325 MG tablet Take 650 mg by mouth every 6 (six) hours as needed.    . cephALEXin (KEFLEX) 500 MG capsule Take 1 capsule (500 mg total) by mouth 3 (three) times daily. 30 capsule 2  . HYDROcodone-acetaminophen (NORCO/VICODIN) 5-325 MG tablet Take 1-2 tablets by mouth every 4 (four) hours as needed. 20 tablet 0  . ibuprofen (ADVIL,MOTRIN) 200 MG tablet Take 200 mg by mouth every 6 (six) hours  as needed.    Marland Kitchen PRESCRIPTION MEDICATION Cholesterol medication     No facility-administered medications prior to visit.    ROS   Objective:  There were no vitals filed for this visit.   on *** LPM *** RA BMI Readings from Last 3 Encounters:  02/05/16 37.88 kg/m  12/26/15 38.02 kg/m  02/23/14 33.00 kg/m   Wt Readings from Last 3 Encounters:  02/05/16 264 lb (119.7 kg)  12/26/15 265 lb (120.2 kg)  02/23/14 230 lb (104.3 kg)    Physical Exam   CBC    Component Value Date/Time   HGB 14.0 12/26/2015 0741    CHEMISTRY No results for input(s): NA, K, CL, CO2, GLUCOSE, BUN, CREATININE, CALCIUM, MG, PHOS in the last 168 hours. CrCl cannot be calculated (No successful lab value found.). ***  Chest Imaging- films  reviewed: ***  Pulmonary Functions Testing Results: No flowsheet data found.  Pathology: ***  Echocardiogram: ***  Heart Catheterization: ***    Assessment & Plan:   No diagnosis found.    Current Outpatient Medications:  .  acetaminophen (TYLENOL) 325 MG tablet, Take 650 mg by mouth every 6 (six) hours as needed., Disp: , Rfl:  .  cephALEXin (KEFLEX) 500 MG capsule, Take 1 capsule (500 mg total) by mouth 3 (three) times daily., Disp: 30 capsule, Rfl: 2 .  HYDROcodone-acetaminophen (NORCO/VICODIN) 5-325 MG tablet, Take 1-2 tablets by mouth every 4 (four) hours as needed., Disp: 20 tablet, Rfl: 0 .  ibuprofen (ADVIL,MOTRIN) 200 MG tablet, Take 200 mg by mouth every 6 (six) hours as needed., Disp: , Rfl:  .  PRESCRIPTION MEDICATION, Cholesterol medication, Disp: , Rfl:    I spent *** minutes on this encounter, including face to face time and non-face to face time spent reviewing records, charting, coordinating care, etc.   Steffanie Dunn, DO [facility] 08/15/2019 7:47 AM

## 2019-09-20 HISTORY — PX: ROUX-EN-Y GASTRIC BYPASS: SHX1104

## 2019-09-22 ENCOUNTER — Other Ambulatory Visit: Payer: Self-pay

## 2019-09-22 ENCOUNTER — Emergency Department (HOSPITAL_COMMUNITY): Payer: Medicaid Other

## 2019-09-22 ENCOUNTER — Encounter (HOSPITAL_COMMUNITY): Payer: Self-pay

## 2019-09-22 ENCOUNTER — Inpatient Hospital Stay (HOSPITAL_COMMUNITY)
Admission: EM | Admit: 2019-09-22 | Discharge: 2019-10-12 | DRG: 853 | Disposition: A | Discharge status: Home / Self Care | Payer: Medicaid Other | Attending: General Surgery | Admitting: General Surgery

## 2019-09-22 DIAGNOSIS — R000 Tachycardia, unspecified: Secondary | ICD-10-CM | POA: Diagnosis present

## 2019-09-22 DIAGNOSIS — G4733 Obstructive sleep apnea (adult) (pediatric): Secondary | ICD-10-CM | POA: Diagnosis present

## 2019-09-22 DIAGNOSIS — Z87891 Personal history of nicotine dependence: Secondary | ICD-10-CM

## 2019-09-22 DIAGNOSIS — Z9884 Bariatric surgery status: Secondary | ICD-10-CM

## 2019-09-22 DIAGNOSIS — Z20822 Contact with and (suspected) exposure to covid-19: Secondary | ICD-10-CM | POA: Diagnosis present

## 2019-09-22 DIAGNOSIS — R918 Other nonspecific abnormal finding of lung field: Secondary | ICD-10-CM | POA: Diagnosis present

## 2019-09-22 DIAGNOSIS — R188 Other ascites: Secondary | ICD-10-CM | POA: Diagnosis present

## 2019-09-22 DIAGNOSIS — J9601 Acute respiratory failure with hypoxia: Secondary | ICD-10-CM | POA: Diagnosis not present

## 2019-09-22 DIAGNOSIS — Z6841 Body Mass Index (BMI) 40.0 and over, adult: Secondary | ICD-10-CM

## 2019-09-22 DIAGNOSIS — J690 Pneumonitis due to inhalation of food and vomit: Secondary | ICD-10-CM

## 2019-09-22 DIAGNOSIS — Y832 Surgical operation with anastomosis, bypass or graft as the cause of abnormal reaction of the patient, or of later complication, without mention of misadventure at the time of the procedure: Secondary | ICD-10-CM | POA: Diagnosis present

## 2019-09-22 DIAGNOSIS — Z791 Long term (current) use of non-steroidal anti-inflammatories (NSAID): Secondary | ICD-10-CM

## 2019-09-22 DIAGNOSIS — K650 Generalized (acute) peritonitis: Secondary | ICD-10-CM | POA: Diagnosis present

## 2019-09-22 DIAGNOSIS — K5730 Diverticulosis of large intestine without perforation or abscess without bleeding: Secondary | ICD-10-CM | POA: Diagnosis present

## 2019-09-22 DIAGNOSIS — N179 Acute kidney failure, unspecified: Secondary | ICD-10-CM | POA: Diagnosis present

## 2019-09-22 DIAGNOSIS — K660 Peritoneal adhesions (postprocedural) (postinfection): Secondary | ICD-10-CM | POA: Diagnosis present

## 2019-09-22 DIAGNOSIS — E860 Dehydration: Secondary | ICD-10-CM | POA: Diagnosis present

## 2019-09-22 DIAGNOSIS — Z9889 Other specified postprocedural states: Secondary | ICD-10-CM

## 2019-09-22 DIAGNOSIS — T8144XA Sepsis following a procedure, initial encounter: Secondary | ICD-10-CM | POA: Diagnosis present

## 2019-09-22 DIAGNOSIS — R6510 Systemic inflammatory response syndrome (SIRS) of non-infectious origin without acute organ dysfunction: Secondary | ICD-10-CM

## 2019-09-22 DIAGNOSIS — Z79899 Other long term (current) drug therapy: Secondary | ICD-10-CM

## 2019-09-22 DIAGNOSIS — K760 Fatty (change of) liver, not elsewhere classified: Secondary | ICD-10-CM | POA: Diagnosis present

## 2019-09-22 DIAGNOSIS — K9189 Other postprocedural complications and disorders of digestive system: Secondary | ICD-10-CM

## 2019-09-22 DIAGNOSIS — K651 Peritoneal abscess: Secondary | ICD-10-CM | POA: Diagnosis present

## 2019-09-22 DIAGNOSIS — E43 Unspecified severe protein-calorie malnutrition: Secondary | ICD-10-CM | POA: Diagnosis present

## 2019-09-22 DIAGNOSIS — A419 Sepsis, unspecified organism: Principal | ICD-10-CM | POA: Diagnosis present

## 2019-09-22 DIAGNOSIS — R1084 Generalized abdominal pain: Secondary | ICD-10-CM

## 2019-09-22 DIAGNOSIS — R739 Hyperglycemia, unspecified: Secondary | ICD-10-CM | POA: Diagnosis not present

## 2019-09-22 DIAGNOSIS — K9589 Other complications of other bariatric procedure: Secondary | ICD-10-CM | POA: Diagnosis present

## 2019-09-22 DIAGNOSIS — E785 Hyperlipidemia, unspecified: Secondary | ICD-10-CM | POA: Diagnosis present

## 2019-09-22 DIAGNOSIS — Z79891 Long term (current) use of opiate analgesic: Secondary | ICD-10-CM

## 2019-09-22 DIAGNOSIS — J9811 Atelectasis: Secondary | ICD-10-CM | POA: Diagnosis present

## 2019-09-22 LAB — COMPREHENSIVE METABOLIC PANEL
ALT: 56 U/L — ABNORMAL HIGH (ref 0–44)
AST: 24 U/L (ref 15–41)
Albumin: 3.5 g/dL (ref 3.5–5.0)
Alkaline Phosphatase: 110 U/L (ref 38–126)
Anion gap: 12 (ref 5–15)
BUN: 16 mg/dL (ref 6–20)
CO2: 22 mmol/L (ref 22–32)
Calcium: 8.3 mg/dL — ABNORMAL LOW (ref 8.9–10.3)
Chloride: 104 mmol/L (ref 98–111)
Creatinine, Ser: 0.94 mg/dL (ref 0.61–1.24)
GFR calc Af Amer: >60 mL/min (ref >60)
GFR calc non Af Amer: >60 mL/min (ref >60)
Glucose, Bld: 132 mg/dL — ABNORMAL HIGH (ref 70–99)
Potassium: 3.3 mmol/L — ABNORMAL LOW (ref 3.5–5.1)
Sodium: 138 mmol/L (ref 135–145)
Total Bilirubin: 3.8 mg/dL — ABNORMAL HIGH (ref 0.3–1.2)
Total Protein: 7.6 g/dL (ref 6.5–8.1)

## 2019-09-22 LAB — URINALYSIS, ROUTINE W REFLEX MICROSCOPIC
Glucose, UA: NEGATIVE mg/dL
Hgb urine dipstick: NEGATIVE
Ketones, ur: 20 mg/dL — AB
Leukocytes,Ua: NEGATIVE
Nitrite: NEGATIVE
Protein, ur: 100 mg/dL — AB
Specific Gravity, Urine: 1.028 (ref 1.005–1.030)
pH: 5 (ref 5.0–8.0)

## 2019-09-22 LAB — CBC
HCT: 37.3 % — ABNORMAL LOW (ref 39.0–52.0)
Hemoglobin: 12.7 g/dL — ABNORMAL LOW (ref 13.0–17.0)
MCH: 29.6 pg (ref 26.0–34.0)
MCHC: 34 g/dL (ref 30.0–36.0)
MCV: 86.9 fL (ref 80.0–100.0)
Platelets: 193 10*3/uL (ref 150–400)
RBC: 4.29 MIL/uL (ref 4.22–5.81)
RDW: 12.9 % (ref 11.5–15.5)
WBC: 9.4 10*3/uL (ref 4.0–10.5)
nRBC: 0 % (ref 0.0–0.2)

## 2019-09-22 LAB — LIPASE, BLOOD: Lipase: 31 U/L (ref 11–51)

## 2019-09-22 LAB — SARS CORONAVIRUS 2 BY RT PCR (HOSPITAL ORDER, PERFORMED IN ~~LOC~~ HOSPITAL LAB): SARS Coronavirus 2: NEGATIVE

## 2019-09-22 LAB — LACTIC ACID, PLASMA
Lactic Acid, Venous: 1.1 mmol/L (ref 0.5–1.9)
Lactic Acid, Venous: 1.3 mmol/L (ref 0.5–1.9)

## 2019-09-22 LAB — PROTIME-INR
INR: 1.1 (ref 0.8–1.2)
Prothrombin Time: 13.7 seconds (ref 11.4–15.2)

## 2019-09-22 LAB — HIV ANTIBODY (ROUTINE TESTING W REFLEX): HIV Screen 4th Generation wRfx: NONREACTIVE

## 2019-09-22 MED ORDER — HYDROMORPHONE HCL 1 MG/ML IJ SOLN
1.0000 mg | Freq: Once | INTRAMUSCULAR | Status: AC
  Administered 2019-09-22: 1 mg via INTRAVENOUS
  Filled 2019-09-22: qty 1

## 2019-09-22 MED ORDER — KCL IN DEXTROSE-NACL 20-5-0.45 MEQ/L-%-% IV SOLN
INTRAVENOUS | Status: DC
  Filled 2019-09-22 (×4): qty 1000

## 2019-09-22 MED ORDER — ONDANSETRON HCL 4 MG/2ML IJ SOLN
4.0000 mg | Freq: Once | INTRAMUSCULAR | Status: AC
  Administered 2019-09-22: 4 mg via INTRAVENOUS
  Filled 2019-09-22: qty 2

## 2019-09-22 MED ORDER — DOCUSATE SODIUM 100 MG PO CAPS
100.0000 mg | ORAL_CAPSULE | Freq: Two times a day (BID) | ORAL | Status: DC
  Administered 2019-09-22 – 2019-09-23 (×3): 100 mg via ORAL
  Filled 2019-09-22 (×3): qty 1

## 2019-09-22 MED ORDER — SODIUM CHLORIDE 0.9 % IV BOLUS
1000.0000 mL | Freq: Once | INTRAVENOUS | Status: AC
  Administered 2019-09-22: 1000 mL via INTRAVENOUS

## 2019-09-22 MED ORDER — LIDOCAINE 5 % EX PTCH
1.0000 | MEDICATED_PATCH | CUTANEOUS | Status: DC
  Administered 2019-09-22 – 2019-10-11 (×19): 1 via TRANSDERMAL
  Filled 2019-09-22 (×22): qty 1

## 2019-09-22 MED ORDER — MORPHINE SULFATE (PF) 2 MG/ML IV SOLN
2.0000 mg | INTRAVENOUS | Status: DC | PRN
  Administered 2019-09-23: 4 mg via INTRAVENOUS
  Administered 2019-09-25: 2 mg via INTRAVENOUS
  Filled 2019-09-22: qty 1
  Filled 2019-09-22: qty 2

## 2019-09-22 MED ORDER — METOPROLOL TARTRATE 5 MG/5ML IV SOLN: 5.0000 mg | Freq: Four times a day (QID) | INTRAVENOUS | Status: DC | PRN

## 2019-09-22 MED ORDER — ACETAMINOPHEN 500 MG PO TABS
1000.0000 mg | ORAL_TABLET | Freq: Four times a day (QID) | ORAL | Status: DC
  Administered 2019-09-22 – 2019-09-23 (×5): 1000 mg via ORAL
  Filled 2019-09-22 (×6): qty 2

## 2019-09-22 MED ORDER — SODIUM CHLORIDE (PF) 0.9 % IJ SOLN
INTRAMUSCULAR | Status: AC
  Filled 2019-09-22: qty 50

## 2019-09-22 MED ORDER — ONDANSETRON HCL 4 MG/2ML IJ SOLN
4.0000 mg | Freq: Four times a day (QID) | INTRAMUSCULAR | Status: DC | PRN
  Administered 2019-09-25: 4 mg via INTRAVENOUS
  Filled 2019-09-22: qty 2

## 2019-09-22 MED ORDER — PIPERACILLIN-TAZOBACTAM 3.375 G IVPB 30 MIN
3.3750 g | Freq: Once | INTRAVENOUS | Status: AC
  Administered 2019-09-22: 3.375 g via INTRAVENOUS
  Filled 2019-09-22: qty 50

## 2019-09-22 MED ORDER — ENSURE MAX PROTEIN PO LIQD
11.0000 [oz_av] | Freq: Two times a day (BID) | ORAL | Status: DC
  Administered 2019-09-22 (×2): 11 [oz_av] via ORAL
  Filled 2019-09-22: qty 330

## 2019-09-22 MED ORDER — ONDANSETRON 4 MG PO TBDP
4.0000 mg | ORAL_TABLET | Freq: Four times a day (QID) | ORAL | Status: DC | PRN
  Administered 2019-09-23: 4 mg via ORAL
  Filled 2019-09-22: qty 1

## 2019-09-22 MED ORDER — SODIUM CHLORIDE 0.9% FLUSH
3.0000 mL | Freq: Once | INTRAVENOUS | Status: AC
  Administered 2019-09-22: 3 mL via INTRAVENOUS

## 2019-09-22 MED ORDER — IOHEXOL 350 MG/ML SOLN
100.0000 mL | Freq: Once | INTRAVENOUS | Status: AC | PRN
  Administered 2019-09-22: 100 mL via INTRAVENOUS

## 2019-09-22 MED ORDER — MORPHINE SULFATE (PF) 4 MG/ML IV SOLN
4.0000 mg | Freq: Once | INTRAVENOUS | Status: AC
  Administered 2019-09-22: 4 mg via INTRAVENOUS
  Filled 2019-09-22: qty 1

## 2019-09-22 MED ORDER — DIPHENHYDRAMINE HCL 50 MG/ML IJ SOLN
25.0000 mg | Freq: Four times a day (QID) | INTRAMUSCULAR | Status: DC | PRN
  Administered 2019-09-26 – 2019-10-02 (×2): 25 mg via INTRAVENOUS
  Filled 2019-09-22 (×2): qty 1

## 2019-09-22 MED ORDER — ENOXAPARIN SODIUM 40 MG/0.4ML ~~LOC~~ SOLN
40.0000 mg | SUBCUTANEOUS | Status: DC
  Administered 2019-09-22 – 2019-09-23 (×2): 40 mg via SUBCUTANEOUS
  Filled 2019-09-22 (×2): qty 0.4

## 2019-09-22 MED ORDER — PANTOPRAZOLE SODIUM 40 MG PO TBEC
40.0000 mg | DELAYED_RELEASE_TABLET | Freq: Every day | ORAL | Status: DC
  Administered 2019-09-22 – 2019-09-23 (×2): 40 mg via ORAL
  Filled 2019-09-22 (×2): qty 1

## 2019-09-22 MED ORDER — OXYCODONE HCL 5 MG PO TABS
5.0000 mg | ORAL_TABLET | ORAL | Status: DC | PRN
  Administered 2019-09-22 – 2019-09-23 (×4): 10 mg via ORAL
  Filled 2019-09-22 (×4): qty 2

## 2019-09-22 MED ORDER — DIPHENHYDRAMINE HCL 25 MG PO CAPS: 25.0000 mg | ORAL_CAPSULE | Freq: Four times a day (QID) | ORAL | Status: DC | PRN

## 2019-09-22 MED ORDER — POTASSIUM CHLORIDE 20 MEQ PO PACK
40.0000 meq | PACK | Freq: Once | ORAL | Status: AC
  Administered 2019-09-22: 40 meq via ORAL
  Filled 2019-09-22: qty 2

## 2019-09-22 NOTE — H&P (Addendum)
[facility] Admission Note  ANGELES PAOLUCCI [birth year]  [id number].    Requesting MD: Charm Barges, MD  Chief Complaint/Reason for Consult: abdominal pain after gastric bypass in [place]  HPI:  Brad Hughes is a 44 y/o male with a PMH obesity, HLD, anal fistula, and recent history of roux-en-y gastric bypass who presented to the ED with a cc uncontrolled abdominal pain. He states he was in [place] 09/20/19 for his roux-en-y gastric bypass by Dr. Louann Sjogren, was discharged home same day, and drove back to [facility] 09/21/19. He states he developed acute abdominal pain last night that is constant and worse with movement. Pain is non-radiating. Denies associated fever, chills, nausea, or emesis. Reports he is having flatus but has not had a BM since surgery. He is scheduled for a telehealth follow up with his surgeon in two weeks. The patients wife is at the bedside.   ROS: Review of Systems  All other systems reviewed and are negative.  History reviewed. No pertinent family history.  Past Medical History:  Diagnosis Date  . Anal fistula 2017  . Hyperlipidemia   . 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 ANAL FISTULOTOMY N/A 12/26/2015   Procedure: FISTULOTOMY;  Surgeon: Romie Levee, MD;  Location: [facility];  Service: General;  Laterality: N/A;  . RECTAL EXAM UNDER ANESTHESIA  2015  . RECTAL EXAM UNDER ANESTHESIA N/A 12/26/2015   Procedure: RECTAL EXAM UNDER ANESTHESIA;  Surgeon: Romie Levee, MD;  Location: [facility];  Service: General;  Laterality: N/A;  . TREATMENT FISTULA ANAL      Social History:  reports that he quit smoking about 10 years ago. His smoking use included cigarettes. He has never used smokeless tobacco. He reports that he does not drink alcohol. No history on file for drug use.  Allergies: No Known Allergies  (Not  in a hospital admission)   Blood pressure 124/70, pulse (!) 114, temperature 99.3 F (37.4 C), resp. rate (!) 28, height 5\' 10"  (1.778 m), weight 131.5 kg, SpO2 95 %. Physical Exam: Constitutional: NAD; conversant; no deformities Eyes: Moist conjunctiva; no lid lag; anicteric; PERRL Neck: Trachea midline; no thyromegaly Lungs: Normal respiratory effort; no tactile fremitus CV: RRR; no palpable thrills; no pitting edema GI: Abd soft, obese, appropriately tender around his incisions without guarding or peritonitis, +BS, no palpable hepatosplenomegaly MSK: symmetrical; no clubbing/cyanosis Psychiatric: Appropriate affect; alert and oriented x3 Lymphatic: No palpable cervical or axillary lymphadenopathy  Results for orders placed or performed during the hospital encounter of 09/22/19 (from the past 48 hour(s))  Lipase, blood     Status: None   Collection Time: 09/22/19  6:23 AM  Result Value Ref Range   Lipase 31 11 - 51 U/L    Comment: Performed at [facility], [address] [place], [place]  Comprehensive metabolic panel     Status: Abnormal   Collection Time: 09/22/19  6:23 AM  Result Value Ref Range   Sodium 138 135 - 145 mmol/L   Potassium 3.3 (L) 3.5 - 5.1 mmol/L   Chloride 104 98 - 111 mmol/L   CO2 22 22 - 32 mmol/L   Glucose, Bld 132 (H) 70 - 99 mg/dL    Comment: Glucose reference range applies only to samples taken after fasting for at least 8 hours.   BUN 16 6 - 20 mg/dL  Creatinine, Ser 0.94 0.61 - 1.24 mg/dL   Calcium 8.3 (L) 8.9 - 10.3 mg/dL   Total Protein 7.6 6.5 - 8.1 g/dL   Albumin 3.5 3.5 - 5.0 g/dL   AST 24 15 - 41 U/L   ALT 56 (H) 0 - 44 U/L   Alkaline Phosphatase 110 38 - 126 U/L   Total Bilirubin 3.8 (H) 0.3 - 1.2 mg/dL   GFR calc non Af Amer >60 >60 mL/min   GFR calc Af Amer >60 >60 mL/min   Anion gap 12 5 - 15    Comment: Performed at [facility], [address] [place], [place] [address]  CBC      Status: Abnormal   Collection Time: 09/22/19  6:23 AM  Result Value Ref Range   WBC 9.4 4.0 - 10.5 K/uL   RBC 4.29 4.22 - 5.81 MIL/uL   Hemoglobin 12.7 (L) 13.0 - 17.0 g/dL   HCT 51.7 (L) 39 - 52 %   MCV 86.9 80.0 - 100.0 fL   MCH 29.6 26.0 - 34.0 pg   MCHC 34.0 30.0 - 36.0 g/dL   RDW 00.1 74.9 - 44.9 %   Platelets 193 150 - 400 K/uL   nRBC 0.0 0.0 - 0.2 %    Comment: Performed at [facility], [address] [place], [place] [address]  Protime-INR     Status: None   Collection Time: 09/22/19  6:23 AM  Result Value Ref Range   Prothrombin Time 13.7 11.4 - 15.2 seconds   INR 1.1 0.8 - 1.2    Comment: (NOTE) INR goal varies based on device and disease states. Performed at [facility], [address], [place], [place] [address]   Lactic acid, plasma     Status: None   Collection Time: 09/22/19  6:31 AM  Result Value Ref Range   Lactic Acid, Venous 1.1 0.5 - 1.9 mmol/L    Comment: Performed at [facility], [address], [place], [place] [address]  Urinalysis, Routine w reflex microscopic     Status: Abnormal   Collection Time: 09/22/19  7:18 AM  Result Value Ref Range   Color, Urine AMBER (A) YELLOW    Comment: BIOCHEMICALS MAY BE AFFECTED BY COLOR   APPearance HAZY (A) CLEAR   Specific Gravity, Urine 1.028 1.005 - 1.030   pH 5.0 5.0 - 8.0   Glucose, UA NEGATIVE NEGATIVE mg/dL   Hgb urine dipstick NEGATIVE NEGATIVE   Bilirubin Urine SMALL (A) NEGATIVE   Ketones, ur 20 (A) NEGATIVE mg/dL   Protein, ur 570 (A) NEGATIVE mg/dL   Nitrite NEGATIVE NEGATIVE   Leukocytes,Ua NEGATIVE NEGATIVE   RBC / HPF 0-5 0 - 5 RBC/hpf   WBC, UA 0-5 0 - 5 WBC/hpf   Bacteria, UA MANY (A) NONE SEEN   Squamous Epithelial / LPF 0-5 0 - 5   Mucus PRESENT     Comment: Performed at [facility], [address], [place], [place] [address]  Lactic acid, plasma     Status: None   Collection Time: 09/22/19  8:31 AM  Result  Value Ref Range   Lactic Acid, Venous 1.3 0.5 - 1.9 mmol/L    Comment: Performed at [facility], [address], [place], [place] [address]   DG Chest 2 View  Result Date: 09/22/2019 CLINICAL DATA:  44 year old male with history of epigastric pain. Suspected sepsis. EXAM: CHEST - 2 VIEW COMPARISON:  No  priors. FINDINGS: Low lung volumes. Atelectasis and/or consolidation in the medial aspect of the left lower lobe. Right lung is clear. Trace bilateral pleural effusions. No evidence of pulmonary edema. No pneumothorax. No suspicious appearing pulmonary nodules or masses are noted. Heart size is normal. Upper mediastinal contours are within normal limits. IMPRESSION: 1. Low lung volumes with atelectasis and/or consolidation in the medial aspect of the left lower lobe. 2. Trace bilateral pleural effusions. Electronically Signed   By: Trudie Reedaniel  Entrikin M.D.   On: 09/22/2019 07:29   CT Angio Chest PE W/Cm &/Or Wo Cm  Result Date: 09/22/2019 CLINICAL DATA:  44 year old male with history of abdominal pain and fever. Status post gastric bypass procedure 2 days ago. Shortness of breath. EXAM: CT ANGIOGRAPHY CHEST CT ABDOMEN AND PELVIS WITH CONTRAST TECHNIQUE: Multidetector CT imaging of the chest was performed using the standard protocol during bolus administration of intravenous contrast. Multiplanar CT image reconstructions and MIPs were obtained to evaluate the vascular anatomy. Multidetector CT imaging of the abdomen and pelvis was performed using the standard protocol during bolus administration of intravenous contrast. CONTRAST:  100mL OMNIPAQUE IOHEXOL 350 MG/ML SOLN COMPARISON:  None. FINDINGS: CTA CHEST FINDINGS Comment: Poor quality examination secondary to a combination of suboptimal contrast bolus and extensive patient respiratory motion. Cardiovascular: No central, lobar or proximal segmental sized filling defects are noted to suggest pulmonary embolism. Distal segmental and  subsegmental sized emboli cannot be entirely excluded secondary to extensive patient respiratory motion. Heart size is normal. There is no significant pericardial fluid, thickening or pericardial calcification. No atherosclerotic calcifications in the thoracic aorta or the coronary arteries. Mediastinum/Nodes: No pathologically enlarged mediastinal or hilar lymph nodes. Esophagus is unremarkable in appearance. No axillary lymphadenopathy. Lungs/Pleura: Dependent opacities and volume loss in the lower lobes of the lungs bilaterally, compatible with a combination of airspace consolidation and atelectasis, suspicious for sequela of recent aspiration. Trace left pleural effusion. Musculoskeletal: There are no aggressive appearing lytic or blastic lesions noted in the visualized portions of the skeleton. Review of the MIP images confirms the above findings. CT ABDOMEN and PELVIS FINDINGS Hepatobiliary: Diffuse low attenuation throughout the hepatic parenchyma, indicative of hepatic steatosis. No cystic or solid hepatic lesions. No intra or extrahepatic biliary ductal dilatation. Gallbladder is normal in appearance. Pancreas: No pancreatic mass. No pancreatic ductal dilatation. No pancreatic or peripancreatic fluid collections or inflammatory changes. Spleen: Unremarkable. Adrenals/Urinary Tract: Bilateral kidneys and adrenal glands are normal in appearance. No hydroureteronephrosis. Urinary bladder is normal in appearance. 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 Intramural gas along the anterior aspect of the gastric antrum best appreciated on axial image 29 of series 2. Small to moderate amount of pneumoperitoneum, particularly in the upper abdomen adjacent to the surgical site. Some mildly dilated loops of jejunum are noted, presumably mild postoperative ileus. No other pathologic dilatation of small bowel or colon. A few scattered colonic diverticuli are evident, without surrounding  inflammatory changes to clearly indicate an acute diverticulitis at this time. Normal appendix. Vascular/Lymphatic: No significant atherosclerotic disease, aneurysm or dissection noted in the abdominal or pelvic vasculature. No lymphadenopathy noted in the abdomen or pelvis. Reproductive: Prostate gland and seminal vesicles are unremarkable in appearance. Other: Small to moderate amount of pneumoperitoneum, presumably related to recent surgery. Trace volume of ascites. Musculoskeletal: There are no aggressive appearing lytic or blastic lesions noted in the visualized portions of the skeleton. Review of the MIP images confirms the above findings. IMPRESSION: 1. Limited study demonstrating no central, lobar or  proximal segmental sized pulmonary embolism. 2. The appearance of the dependent portions of the lungs suggest sequela of aspiration with a combination of atelectasis and airspace consolidation bilaterally in the lower lobes. 3. 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 There is also a small volume of ascites. 4. Severe hepatic steatosis. 5. Mild colonic diverticulosis without evidence to suggest an acute diverticulitis at this time. Electronically Signed   By: Trudie Reed M.D.   On: 09/22/2019 10:44   CT Abdomen Pelvis W Contrast  Result Date: 09/22/2019 CLINICAL DATA:  44 year old male with history of abdominal pain and fever. Status post gastric bypass procedure 2 days ago. Shortness of breath. EXAM: CT ANGIOGRAPHY CHEST CT ABDOMEN AND PELVIS WITH CONTRAST TECHNIQUE: Multidetector CT imaging of the chest was performed using the standard protocol during bolus administration of intravenous contrast. Multiplanar CT image reconstructions and MIPs were obtained to evaluate the vascular anatomy. Multidetector CT imaging  of the abdomen and pelvis was performed using the standard protocol during bolus administration of intravenous contrast. CONTRAST:  OMNIPAQUE IOHEXOL 350 MG/ML SOLN COMPARISON:  None. FINDINGS: CTA CHEST FINDINGS Comment: Poor quality examination secondary to a combination of suboptimal contrast bolus and extensive patient respiratory motion. Cardiovascular: No central, lobar or proximal segmental sized filling defects are noted to suggest pulmonary embolism. Distal segmental and subsegmental sized emboli cannot be entirely excluded secondary to extensive patient respiratory motion. Heart size is normal. There is no significant pericardial fluid, thickening or pericardial calcification. No atherosclerotic calcifications in the thoracic aorta or the coronary arteries. Mediastinum/Nodes: No pathologically enlarged mediastinal or hilar lymph nodes. Esophagus is unremarkable in appearance. No axillary lymphadenopathy. Lungs/Pleura: Dependent opacities and volume loss in the lower lobes of the lungs bilaterally, compatible with a combination of airspace consolidation and atelectasis, suspicious for sequela of recent aspiration. Trace left pleural effusion. Musculoskeletal: There are no aggressive appearing lytic or blastic lesions noted in the visualized portions of the skeleton. Review of the MIP images confirms the above findings. CT ABDOMEN and PELVIS FINDINGS Hepatobiliary: Diffuse low attenuation throughout the hepatic parenchyma, indicative of hepatic steatosis. No cystic or solid hepatic lesions. No intra or extrahepatic biliary ductal dilatation. Gallbladder is normal in appearance. Pancreas: No pancreatic mass. No pancreatic ductal dilatation. No pancreatic or peripancreatic fluid collections or inflammatory changes. Spleen: Unremarkable. Adrenals/Urinary Tract: Bilateral kidneys and adrenal glands are normal in appearance. No hydroureteronephrosis. Urinary bladder is normal in appearance. Stomach/Bowel:  Postoperative changes of recent Roux-en-Y gastric bypass. Intramural gas along the anterior aspect of the gastric antrum best appreciated on axial image 29 of series 2. Small to moderate amount of pneumoperitoneum, particularly in the upper abdomen adjacent to the surgical site. Some mildly dilated loops of jejunum are noted, presumably mild postoperative ileus. No other pathologic dilatation of small bowel or colon. A few scattered colonic diverticuli are evident, without surrounding inflammatory changes to clearly indicate an acute diverticulitis at this time. Normal appendix. Vascular/Lymphatic: No significant atherosclerotic disease, aneurysm or dissection noted in the abdominal or pelvic vasculature. No lymphadenopathy noted in the abdomen or pelvis. Reproductive: Prostate gland and seminal vesicles are unremarkable in appearance. Other: Small to moderate amount of pneumoperitoneum, presumably related to recent surgery. Trace volume of ascites. Musculoskeletal: There are no aggressive appearing lytic or blastic lesions noted in the visualized portions of the skeleton. Review of the  MIP images confirms the above findings. IMPRESSION: 1. Limited study demonstrating no central, lobar or proximal segmental sized pulmonary embolism. 2. The appearance of the dependent portions of the lungs suggest sequela of aspiration with a combination of atelectasis and airspace consolidation bilaterally in the lower lobes. 3. 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 There is also a small volume of ascites. 4. Severe hepatic steatosis. 5. Mild colonic diverticulosis without evidence to suggest an acute diverticulitis at this time. Electronically Signed   By: Trudie Reed M.D.   On: 09/22/2019 10:44   Assessment/Plan Obesity  Abdominal  pain Tachycardia  POD#2 s/p Roux-en-Y gastric bypass 09/20/19 in [place] by Dr. Monico Blitz  - CT abdomen pelvis negative for post-operative leak, CT angio negative for PE - no acute surgical needs. Admit to observation and HR and RR overnight. Suspect the patients tachycardia is related to dehydration and post-operative pain.  - pain control, PRN antiemetics  - PPI   FEN: IVF 100 cc/hr, bariatric FLD diet, ensure max BID, hypokalemia (3.3) replete PO  ID: none, no concerns for acute infection VTE: SCD's Lovenox    Adam Phenix, PA-C [facility] Please see Amion for pager number during day hours 7:00am-4:30pm 09/22/2019, 11:17 AM

## 2019-09-22 NOTE — ED Triage Notes (Signed)
Pt presents 2 days post op from gastric bypass. Reports abdominal pain. Reports that he is out of his pain medication. A&Ox4. He reports loss of appetite. fentanyl, 4mg  zofran, and of NS en route. CBG 121.

## 2019-09-22 NOTE — ED Provider Notes (Signed)
[facility]-EMERGENCY DEPT Provider Note   CSN: [id number] Arrival date & time: 09/22/19  [id number]     History Chief Complaint  Patient presents with  . Abdominal Pain    Brad Hughes is a 44 y.o. male.  He underwent gastric bypass surgery in [place] 2 days ago.  He does not know what type of procedure he had.  It was done laparoscopically.  He came home yesterday and started having abdominal pain last evening.  Presented here and had a low-grade fever of 100.6.  He denies any nausea or vomiting.  No cough shortness of breath or diarrhea.  Rates the pain is severe and is worse with movement.  Took his pain medication without improvement.  Tried to reach his surgeon but there was no answer.  The history is provided by the patient and the spouse.  Abdominal Pain Pain location:  Generalized Pain quality: aching and stabbing   Pain radiates to:  Does not radiate Pain severity:  Severe Onset quality:  Gradual Duration:  16 hours Timing:  Constant Progression:  Unchanged Chronicity:  New Context: previous surgery   Relieved by:  Nothing Worsened by:  Movement Ineffective treatments: narcotics. Associated symptoms: fever   Associated symptoms: no chest pain, no chills, no constipation, no cough, no dysuria, no hematemesis, no hematochezia, no nausea, no shortness of breath, no sore throat and no vomiting   Risk factors: recent hospitalization        Past Medical History:  Diagnosis Date  . Anal fistula 2017  . Hyperlipidemia   . 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   Past Surgical History:  Procedure Laterality Date  . ANAL FISTULOTOMY N/A 12/26/2015   Procedure: FISTULOTOMY;  Surgeon: Romie LeveeAlicia Thomas, MD;  Location: [facility];  Service: General;  Laterality: N/A;  . RECTAL EXAM UNDER ANESTHESIA  2015  . RECTAL EXAM UNDER ANESTHESIA N/A  12/26/2015   Procedure: RECTAL EXAM UNDER ANESTHESIA;  Surgeon: Romie LeveeAlicia Thomas, MD;  [facility];  Service: General;  Laterality: N/A;  . TREATMENT FISTULA ANAL         History reviewed. No pertinent family history.  Social History   Tobacco Use  . Smoking status: Former Smoker    Types: Cigarettes    Quit date: 12/22/2008    Years since quitting: 10.7  . Smokeless tobacco: Never Used  Substance Use Topics  . Alcohol use: No  . Drug use: Not on file    Home Medications Prior to Admission medications   Medication Sig Start Date End Date Taking? Authorizing Provider  hyoscyamine (LEVSIN SL) 0.125 MG SL tablet Place 0.125 mg under the tongue every 6 (six) hours. 09/19/19  Yes [provider]  ibuprofen (ADVIL) 100 MG/5ML suspension Take 30 mLs by mouth every 6 (six) hours as needed for pain. 09/19/19  Yes [provider]  ondansetron (ZOFRAN-ODT) 4 MG disintegrating tablet Take 4 mg by mouth every 6 (six) hours as needed for nausea/vomiting. 09/19/19  Yes [provider]  pantoprazole (PROTONIX) 40 MG tablet Take 40 mg by mouth daily. 09/19/19  Yes [provider]  cephALEXin (KEFLEX) 500 MG capsule Take 1 capsule (500 mg total) by mouth 3 (three) times daily. Patient not taking: Reported on 09/22/2019 02/23/14   Vivi BarrackWagoner, Matthew R, DPM  HYDROcodone-acetaminophen (NORCO/VICODIN) 5-325 MG tablet Take 1-2 tablets by mouth  every 4 (four) hours as needed. Patient not taking: Reported on 09/22/2019 12/26/15   Romie Levee, MD  oxyCODONE-acetaminophen (PERCOCET/ROXICET) 5-325 MG tablet Take 1 tablet by mouth every 8 (eight) hours as needed for pain. 09/19/19   [provider]  PROMETHEGAN 25 MG suppository Place 25 mg rectally every 12 (twelve) hours as needed for nausea/vomiting. 09/19/19   [provider]    Allergies    Patient has no known allergies.  Review of Systems   Review of Systems  Constitutional:  Positive for fever. Negative for chills.  HENT: Negative for sore throat.   Eyes: Negative for visual disturbance.  Respiratory: Negative for cough and shortness of breath.   Cardiovascular: Negative for chest pain.  Gastrointestinal: Positive for abdominal pain. Negative for constipation, hematemesis, hematochezia, nausea and vomiting.  Genitourinary: Negative for dysuria.  Musculoskeletal: Negative for neck pain.  Skin: Positive for wound. Negative for rash.  Neurological: Negative for headaches.    Physical Exam Updated Vital Signs BP 113/81 (BP Location: Left Arm)   Pulse (!) 119   Temp 99.5 F (37.5 C) (Oral)   Resp (!) 39   Ht 5\' 10"  (1.778 m)   Wt 131.5 kg   SpO2 95%   BMI 41.61 kg/m   Physical Exam Vitals and nursing note reviewed.  Constitutional:      General: He is in acute distress.     Appearance: He is well-developed.  HENT:     Head: Normocephalic and atraumatic.  Eyes:     Conjunctiva/sclera: Conjunctivae normal.  Cardiovascular:     Rate and Rhythm: Normal rate and regular rhythm.     Heart sounds: No murmur heard.   Pulmonary:     Effort: Pulmonary effort is normal. Tachypnea present. No respiratory distress.     Breath sounds: Normal breath sounds.  Abdominal:     General: Abdomen is protuberant. There is distension.     Palpations: There is no pulsatile mass.     Tenderness: There is generalized abdominal tenderness. There is rebound. There is no guarding.     Comments: He has multiple fresh port sites, no obvious hernia no overlying erythema  Musculoskeletal:        General: No deformity or signs of injury. Normal range of motion.     Cervical back: Neck supple.  Skin:    General: Skin is warm and dry.     Capillary Refill: Capillary refill takes less than 2 seconds.  Neurological:     General: No focal deficit present.     Mental Status: He is alert.     ED Results / Procedures / Treatments   Labs (all labs ordered are listed, but only  abnormal results are displayed) Labs Reviewed  COMPREHENSIVE METABOLIC PANEL - Abnormal; Notable for the following components:      Result Value   Potassium 3.3 (*)    Glucose, Bld 132 (*)    Calcium 8.3 (*)    ALT 56 (*)    Total Bilirubin 3.8 (*)    All other components within normal limits  CBC - Abnormal; Notable for the following components:   Hemoglobin 12.7 (*)    HCT 37.3 (*)    All other components within normal limits  URINALYSIS, ROUTINE W REFLEX MICROSCOPIC - Abnormal; Notable for the following components:   Color, Urine AMBER (*)    APPearance HAZY (*)    Bilirubin Urine SMALL (*)    Ketones, ur 20 (*)    Protein,  ur 100 (*)    Bacteria, UA MANY (*)    All other components within normal limits  CULTURE, BLOOD (ROUTINE X 2)  CULTURE, BLOOD (ROUTINE X 2)  SARS CORONAVIRUS 2 BY RT PCR (HOSPITAL ORDER, PERFORMED IN [facility])  LIPASE, BLOOD  LACTIC ACID, PLASMA  LACTIC ACID, PLASMA  PROTIME-INR    EKG None  Radiology DG Chest 2 View  Result Date: 09/22/2019 CLINICAL DATA:  44 year old male with history of epigastric pain. Suspected sepsis. EXAM: CHEST - 2 VIEW COMPARISON:  No priors. FINDINGS: Low lung volumes. Atelectasis and/or consolidation in the medial aspect of the left lower lobe. Right lung is clear. Trace bilateral pleural effusions. No evidence of pulmonary edema. No pneumothorax. No suspicious appearing pulmonary nodules or masses are noted. Heart size is normal. Upper mediastinal contours are within normal limits. IMPRESSION: 1. Low lung volumes with atelectasis and/or consolidation in the medial aspect of the left lower lobe. 2. Trace bilateral pleural effusions. Electronically Signed   By: Trudie Reed M.D.   On: 09/22/2019 07:29   CT Angio Chest PE W/Cm &/Or Wo Cm  Result Date: 09/22/2019 CLINICAL DATA:  44 year old male with history of abdominal pain and fever. Status post gastric bypass procedure 2 days ago. Shortness of breath.  EXAM: CT ANGIOGRAPHY CHEST CT ABDOMEN AND PELVIS WITH CONTRAST TECHNIQUE: Multidetector CT imaging of the chest was performed using the standard protocol during bolus administration of intravenous contrast. Multiplanar CT image reconstructions and MIPs were obtained to evaluate the vascular anatomy. Multidetector CT imaging of the abdomen and pelvis was performed using the standard protocol during bolus administration of intravenous contrast. CONTRAST:  OMNIPAQUE IOHEXOL 350 MG/ML SOLN COMPARISON:  None. FINDINGS: CTA CHEST FINDINGS Comment: Poor quality examination secondary to a combination of suboptimal contrast bolus and extensive patient respiratory motion. Cardiovascular: No central, lobar or proximal segmental sized filling defects are noted to suggest pulmonary embolism. Distal segmental and subsegmental sized emboli cannot be entirely excluded secondary to extensive patient respiratory motion. Heart size is normal. There is no significant pericardial fluid, thickening or pericardial calcification. No atherosclerotic calcifications in the thoracic aorta or the coronary arteries. Mediastinum/Nodes: No pathologically enlarged mediastinal or hilar lymph nodes. Esophagus is unremarkable in appearance. No axillary lymphadenopathy. Lungs/Pleura: Dependent opacities and volume loss in the lower lobes of the lungs bilaterally, compatible with a combination of airspace consolidation and atelectasis, suspicious for sequela of recent aspiration. Trace left pleural effusion. Musculoskeletal: There are no aggressive appearing lytic or blastic lesions noted in the visualized portions of the skeleton. Review of the MIP images confirms the above findings. CT ABDOMEN and PELVIS FINDINGS Hepatobiliary: Diffuse low attenuation throughout the hepatic parenchyma, indicative of hepatic steatosis. No cystic or solid hepatic lesions. No intra or extrahepatic biliary ductal dilatation. Gallbladder is normal in appearance.  Pancreas: No pancreatic mass. No pancreatic ductal dilatation. No pancreatic or peripancreatic fluid collections or inflammatory changes. Spleen: Unremarkable. Adrenals/Urinary Tract: Bilateral kidneys and adrenal glands are normal in appearance. No hydroureteronephrosis. Urinary bladder is normal in appearance. 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 Intramural gas along the anterior aspect of the gastric antrum best appreciated on axial image 29 of series 2. Small to moderate amount of pneumoperitoneum, particularly in the upper abdomen adjacent to the surgical site. Some mildly dilated loops of jejunum are noted, presumably mild postoperative ileus. No other pathologic dilatation of small bowel or colon. A few scattered colonic diverticuli are evident, without surrounding inflammatory changes to clearly  indicate an acute diverticulitis at this time. Normal appendix. Vascular/Lymphatic: No significant atherosclerotic disease, aneurysm or dissection noted in the abdominal or pelvic vasculature. No lymphadenopathy noted in the abdomen or pelvis. Reproductive: Prostate gland and seminal vesicles are unremarkable in appearance. Other: Small to moderate amount of pneumoperitoneum, presumably related to recent surgery. Trace volume of ascites. Musculoskeletal: There are no aggressive appearing lytic or blastic lesions noted in the visualized portions of the skeleton. Review of the MIP images confirms the above findings. IMPRESSION: 1. Limited study demonstrating no central, lobar or proximal segmental sized pulmonary embolism. 2. The appearance of the dependent portions of the lungs suggest sequela of aspiration with a combination of atelectasis and airspace consolidation bilaterally in the lower lobes. 3. 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 There is also a small volume of ascites. 4. Severe hepatic steatosis. 5. Mild colonic diverticulosis without evidence to suggest an acute diverticulitis at this time. Electronically Signed   By: Trudie Reed M.D.   On: 09/22/2019 10:44   CT Abdomen Pelvis W Contrast  Result Date: 09/22/2019 CLINICAL DATA:  44 year old male with history of abdominal pain and fever. Status post gastric bypass procedure 2 days ago. Shortness of breath. EXAM: CT ANGIOGRAPHY CHEST CT ABDOMEN AND PELVIS WITH CONTRAST TECHNIQUE: Multidetector CT imaging of the chest was performed using the standard protocol during bolus administration of intravenous contrast. Multiplanar CT image reconstructions and MIPs were obtained to evaluate the vascular anatomy. Multidetector CT imaging of the abdomen and pelvis was performed using the standard protocol during bolus administration of intravenous contrast. CONTRAST:  OMNIPAQUE IOHEXOL 350 MG/ML SOLN COMPARISON:  None. FINDINGS: CTA CHEST FINDINGS Comment: Poor quality examination secondary to a combination of suboptimal contrast bolus and extensive patient respiratory motion. Cardiovascular: No central, lobar or proximal segmental sized filling defects are noted to suggest pulmonary embolism. Distal segmental and subsegmental sized emboli cannot be entirely excluded secondary to extensive patient respiratory motion. Heart size is normal. There is no significant pericardial fluid, thickening or pericardial calcification. No atherosclerotic calcifications in the thoracic aorta or the coronary arteries. Mediastinum/Nodes: No pathologically enlarged mediastinal or hilar lymph nodes. Esophagus is unremarkable in appearance. No axillary lymphadenopathy. Lungs/Pleura: Dependent opacities and volume loss in the lower lobes of the lungs bilaterally, compatible with a combination of airspace consolidation and atelectasis, suspicious for sequela of  recent aspiration. Trace left pleural effusion. Musculoskeletal: There are no aggressive appearing lytic or blastic lesions noted in the visualized portions of the skeleton. Review of the MIP images confirms the above findings. CT ABDOMEN and PELVIS FINDINGS Hepatobiliary: Diffuse low attenuation throughout the hepatic parenchyma, indicative of hepatic steatosis. No cystic or solid hepatic lesions. No intra or extrahepatic biliary ductal dilatation. Gallbladder is normal in appearance. Pancreas: No pancreatic mass. No pancreatic ductal dilatation. No pancreatic or peripancreatic fluid collections or inflammatory changes. Spleen: Unremarkable. Adrenals/Urinary Tract: Bilateral kidneys and adrenal glands are normal in appearance. No hydroureteronephrosis. Urinary bladder is normal in appearance. 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 Intramural gas along the anterior aspect of the gastric antrum best appreciated on axial image 29 of series 2. Small to moderate amount of pneumoperitoneum, particularly in the upper abdomen adjacent to the surgical site. Some mildly dilated loops of jejunum are noted, presumably mild postoperative ileus. No other pathologic dilatation of small bowel  or colon. A few scattered colonic diverticuli are evident, without surrounding inflammatory changes to clearly indicate an acute diverticulitis at this time. Normal appendix. Vascular/Lymphatic: No significant atherosclerotic disease, aneurysm or dissection noted in the abdominal or pelvic vasculature. No lymphadenopathy noted in the abdomen or pelvis. Reproductive: Prostate gland and seminal vesicles are unremarkable in appearance. Other: Small to moderate amount of pneumoperitoneum, presumably related to recent surgery. Trace volume of ascites. Musculoskeletal: There are no aggressive appearing lytic or blastic lesions noted in the visualized portions of the skeleton. Review of the MIP images confirms the  above findings. IMPRESSION: 1. Limited study demonstrating no central, lobar or proximal segmental sized pulmonary embolism. 2. The appearance of the dependent portions of the lungs suggest sequela of aspiration with a combination of atelectasis and airspace consolidation bilaterally in the lower lobes. 3. 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 There is also a small volume of ascites. 4. Severe hepatic steatosis. 5. Mild colonic diverticulosis without evidence to suggest an acute diverticulitis at this time. Electronically Signed   By: Trudie Reed M.D.   On: 09/22/2019 10:44    Procedures .Critical Care Performed by: Terrilee Files, MD Authorized by: Terrilee Files, MD   Critical care provider statement:    Critical care time (minutes):  45   Critical care time was exclusive of:  Separately billable procedures and treating other patients   Critical care was necessary to treat or prevent imminent or life-threatening deterioration of the following conditions:  Respiratory failure and sepsis   Critical care was time spent personally by me on the following activities:  Discussions with consultants, evaluation of patient's response to treatment, examination of patient, ordering and performing treatments and interventions, ordering and review of laboratory studies, ordering and review of radiographic studies, pulse oximetry, re-evaluation of patient's condition, obtaining history from patient or surrogate, review of old charts and development of treatment plan with patient or surrogate   I assumed direction of critical care for this patient from another provider in my specialty: no     (including critical care time)  Medications Ordered in ED Medications  enoxaparin (LOVENOX) injection 40 mg (40 mg  Subcutaneous Given 09/22/19 1421)  dextrose 5 % and 0.45 % NaCl with KCl 20 mEq/L infusion ( Intravenous New Bag/Given 09/22/19 1640)  acetaminophen (TYLENOL) tablet 1,000 mg (1,000 mg Oral Given 09/22/19 1420)  oxyCODONE (Oxy IR/ROXICODONE) immediate release tablet 5-10 mg (10 mg Oral Given 09/22/19 1421)  lidocaine (LIDODERM) 5 % 1 patch (1 patch Transdermal Patch Applied 09/22/19 1421)  protein supplement (ENSURE MAX) liquid (11 oz Oral Given 09/22/19 1422)  docusate sodium (COLACE) capsule 100 mg (100 mg Oral Given 09/22/19 1425)  morphine 2 MG/ML injection 2-4 mg (has no administration in time range)  diphenhydrAMINE (BENADRYL) capsule 25 mg (has no administration in time range)    Or  diphenhydrAMINE (BENADRYL) injection 25 mg (has no administration in time range)  ondansetron (ZOFRAN-ODT) disintegrating tablet 4 mg (has no administration in time range)    Or  ondansetron (ZOFRAN) injection 4 mg (has no administration in time range)  pantoprazole (PROTONIX) EC tablet 40 mg (40 mg Oral Given 09/22/19 1425)  metoprolol tartrate (LOPRESSOR) injection 5 mg (has no administration in time range)  sodium chloride flush (NS) 0.9 % injection 3 mL (3 mLs Intravenous Given 09/22/19  0827)  sodium chloride 0.9 % bolus 1,000 mL (0 mLs Intravenous Stopped 09/22/19 1012)  ondansetron (ZOFRAN) injection 4 mg (4 mg Intravenous Given 09/22/19 0822)  morphine 4 MG/ML injection 4 mg (4 mg Intravenous Given 09/22/19 0822)  piperacillin-tazobactam (ZOSYN) IVPB 3.375 g (0 g Intravenous Stopped 09/22/19 1001)  iohexol (OMNIPAQUE) 350 MG/ML injection 100 mL (100 mLs Intravenous Contrast Given 09/22/19 1003)  HYDROmorphone (DILAUDID) injection 1 mg (1 mg Intravenous Given 09/22/19 1115)  potassium chloride (KLOR-CON) packet 40 mEq (40 mEq Oral Given 09/22/19 1420)    ED Course  I have reviewed the triage vital signs and the nursing notes.  Pertinent labs & imaging results that were available during my care of the patient  were reviewed by me and considered in my medical decision making (see chart for details).  Clinical Course as of Sep 22 1727  Fri Sep 22, 2019  [id number] Call patient's facility in [place] at [phone].  Dr. Thayer Ohm is not available but they are going to message him and have him call me back here.   [MB]  [id number] Discussed with Dr. Joycelyn Rua at [phone].  Patient had a lap Roux-en-Y.  Agrees with current work-up.  Asked to be called back with CT results.   [MB]  1014 Discussed with general surgery who will evaluate the patient in the department.   [MB]    Clinical Course User Index [MB] Terrilee Files, MD   MDM Rules/Calculators/A&P                         This patient complains of generalized abdominal pain fever tachycardia in setting of recent surgery; this involves an extensive number of treatment Options and is a complaint that carries with it a high risk of complications and Morbidity. The differential includes pneumonia, PE, intestinal perforation, peritonitis, sepsis, Sirs  I ordered, reviewed and interpreted labs, which included CBC with normal white count, low hemoglobin unclear baseline, chemistries with mildly low potassium of 3.2 and elevated glucose of 132, LFTs with elevated ALT and T bili.  Urinalysis with many bacteria but no otherwise signs of infection, normal coags, Covid testing negative I ordered medication IV Zosyn, fluids, pain medication I ordered imaging studies which included chest x-ray, CT angio chest CT abdomen and pelvis with contrast and I independently    visualized and interpreted imaging which showed lower lobe infiltrates possible aspiration, inflammatory changes near anastomosis with no definite leak Additional history obtained from patient's wife Previous records obtained and reviewed in epic, no operative report available through Care Everywhere. I consulted patient's surgeon in [place] Dr. Dennie Maizes along with [facility] surgery here at  [facility] And discussed lab and imaging findings  Critical Interventions: Early intervention with antibiotics and fluids in the patient with SIRS criteria.  After the interventions stated above, I reevaluated the patient and found to remain tachycardic and tachypneic with soft blood pressures.  Will need admission for IV antibiotics and fluids.  Surgery is admitting the patient to their service.   Final Clinical Impression(s) / ED Diagnoses Final diagnoses:  SIRS (systemic inflammatory response syndrome) (HCC)  Aspiration pneumonia of lower lobe, unspecified aspiration pneumonia type, unspecified laterality (HCC)  Generalized abdominal pain    Rx / DC Orders ED Discharge Orders    None       Terrilee Files, MD 09/22/19 1736

## 2019-09-22 NOTE — Progress Notes (Signed)
Patient had yellow MEWS @ 2205, patient had just returned from walking. Rechecked and MEWs is no longer yellow, pulse is normal and patient is resting. Patient assessed and no need to notify MD or rapid nurse at this time. Vitals are stable. Will continue to monitor, Marcelle Overlie, RN

## 2019-09-23 ENCOUNTER — Encounter (HOSPITAL_COMMUNITY): Admission: EM | Disposition: A | Discharge status: Home / Self Care | Payer: Self-pay | Source: Home / Self Care

## 2019-09-23 ENCOUNTER — Inpatient Hospital Stay (HOSPITAL_COMMUNITY): Payer: Medicaid Other | Admitting: Certified Registered Nurse Anesthetist

## 2019-09-23 ENCOUNTER — Other Ambulatory Visit: Payer: Self-pay

## 2019-09-23 ENCOUNTER — Encounter (HOSPITAL_COMMUNITY): Payer: Self-pay

## 2019-09-23 DIAGNOSIS — Z87891 Personal history of nicotine dependence: Secondary | ICD-10-CM | POA: Diagnosis not present

## 2019-09-23 DIAGNOSIS — Z791 Long term (current) use of non-steroidal anti-inflammatories (NSAID): Secondary | ICD-10-CM | POA: Diagnosis not present

## 2019-09-23 DIAGNOSIS — E785 Hyperlipidemia, unspecified: Secondary | ICD-10-CM | POA: Diagnosis present

## 2019-09-23 DIAGNOSIS — Z79891 Long term (current) use of opiate analgesic: Secondary | ICD-10-CM | POA: Diagnosis not present

## 2019-09-23 DIAGNOSIS — K760 Fatty (change of) liver, not elsewhere classified: Secondary | ICD-10-CM | POA: Diagnosis present

## 2019-09-23 DIAGNOSIS — K9589 Other complications of other bariatric procedure: Secondary | ICD-10-CM | POA: Diagnosis not present

## 2019-09-23 DIAGNOSIS — R000 Tachycardia, unspecified: Secondary | ICD-10-CM | POA: Diagnosis present

## 2019-09-23 DIAGNOSIS — N179 Acute kidney failure, unspecified: Secondary | ICD-10-CM | POA: Diagnosis present

## 2019-09-23 DIAGNOSIS — R109 Unspecified abdominal pain: Secondary | ICD-10-CM | POA: Diagnosis not present

## 2019-09-23 DIAGNOSIS — K9189 Other postprocedural complications and disorders of digestive system: Secondary | ICD-10-CM

## 2019-09-23 DIAGNOSIS — E43 Unspecified severe protein-calorie malnutrition: Secondary | ICD-10-CM | POA: Diagnosis present

## 2019-09-23 DIAGNOSIS — Z6841 Body Mass Index (BMI) 40.0 and over, adult: Secondary | ICD-10-CM | POA: Diagnosis not present

## 2019-09-23 DIAGNOSIS — Y832 Surgical operation with anastomosis, bypass or graft as the cause of abnormal reaction of the patient, or of later complication, without mention of misadventure at the time of the procedure: Secondary | ICD-10-CM | POA: Diagnosis present

## 2019-09-23 DIAGNOSIS — G4733 Obstructive sleep apnea (adult) (pediatric): Secondary | ICD-10-CM | POA: Diagnosis present

## 2019-09-23 DIAGNOSIS — J9811 Atelectasis: Secondary | ICD-10-CM | POA: Diagnosis not present

## 2019-09-23 DIAGNOSIS — K651 Peritoneal abscess: Secondary | ICD-10-CM | POA: Diagnosis present

## 2019-09-23 DIAGNOSIS — E860 Dehydration: Secondary | ICD-10-CM | POA: Diagnosis not present

## 2019-09-23 DIAGNOSIS — K5730 Diverticulosis of large intestine without perforation or abscess without bleeding: Secondary | ICD-10-CM | POA: Diagnosis present

## 2019-09-23 DIAGNOSIS — J9601 Acute respiratory failure with hypoxia: Secondary | ICD-10-CM | POA: Diagnosis not present

## 2019-09-23 DIAGNOSIS — R739 Hyperglycemia, unspecified: Secondary | ICD-10-CM | POA: Diagnosis not present

## 2019-09-23 DIAGNOSIS — K650 Generalized (acute) peritonitis: Secondary | ICD-10-CM | POA: Diagnosis present

## 2019-09-23 DIAGNOSIS — Z79899 Other long term (current) drug therapy: Secondary | ICD-10-CM | POA: Diagnosis not present

## 2019-09-23 DIAGNOSIS — Z9884 Bariatric surgery status: Secondary | ICD-10-CM | POA: Diagnosis not present

## 2019-09-23 DIAGNOSIS — A419 Sepsis, unspecified organism: Principal | ICD-10-CM

## 2019-09-23 DIAGNOSIS — R188 Other ascites: Secondary | ICD-10-CM | POA: Diagnosis not present

## 2019-09-23 DIAGNOSIS — Z20822 Contact with and (suspected) exposure to covid-19: Secondary | ICD-10-CM | POA: Diagnosis not present

## 2019-09-23 HISTORY — DX: Other postprocedural complications and disorders of digestive system: K91.89

## 2019-09-23 HISTORY — PX: LAPAROSCOPY: SHX197

## 2019-09-23 LAB — COMPREHENSIVE METABOLIC PANEL
ALT: 40 U/L (ref 0–44)
ALT: 34 U/L (ref 0–44)
AST: 18 U/L (ref 15–41)
AST: 13 U/L — ABNORMAL LOW (ref 15–41)
Albumin: 2.9 g/dL — ABNORMAL LOW (ref 3.5–5.0)
Albumin: 3 g/dL — ABNORMAL LOW (ref 3.5–5.0)
Alkaline Phosphatase: 93 U/L (ref 38–126)
Alkaline Phosphatase: 93 U/L (ref 38–126)
Anion gap: 8 (ref 5–15)
Anion gap: 14 (ref 5–15)
BUN: 15 mg/dL (ref 6–20)
BUN: 17 mg/dL (ref 6–20)
CO2: 25 mmol/L (ref 22–32)
CO2: 21 mmol/L — ABNORMAL LOW (ref 22–32)
Calcium: 8.4 mg/dL — ABNORMAL LOW (ref 8.9–10.3)
Calcium: 8.7 mg/dL — ABNORMAL LOW (ref 8.9–10.3)
Chloride: 100 mmol/L (ref 98–111)
Chloride: 101 mmol/L (ref 98–111)
Creatinine, Ser: 0.94 mg/dL (ref 0.61–1.24)
Creatinine, Ser: 1.3 mg/dL — ABNORMAL HIGH (ref 0.61–1.24)
GFR calc Af Amer: >60 mL/min (ref >60)
GFR calc Af Amer: >60 mL/min (ref >60)
GFR calc non Af Amer: >60 mL/min (ref >60)
GFR calc non Af Amer: >60 mL/min (ref >60)
Glucose, Bld: 136 mg/dL — ABNORMAL HIGH (ref 70–99)
Glucose, Bld: 149 mg/dL — ABNORMAL HIGH (ref 70–99)
Potassium: 4.2 mmol/L (ref 3.5–5.1)
Potassium: 4 mmol/L (ref 3.5–5.1)
Sodium: 133 mmol/L — ABNORMAL LOW (ref 135–145)
Sodium: 136 mmol/L (ref 135–145)
Total Bilirubin: 3.1 mg/dL — ABNORMAL HIGH (ref 0.3–1.2)
Total Bilirubin: 3.5 mg/dL — ABNORMAL HIGH (ref 0.3–1.2)
Total Protein: 7 g/dL (ref 6.5–8.1)
Total Protein: 7 g/dL (ref 6.5–8.1)

## 2019-09-23 LAB — MAGNESIUM: Magnesium: 2 mg/dL (ref 1.7–2.4)

## 2019-09-23 LAB — CBC WITH DIFFERENTIAL/PLATELET
Abs Immature Granulocytes: 0.1 10*3/uL — ABNORMAL HIGH (ref 0.00–0.07)
Basophils Absolute: 0 10*3/uL (ref 0.0–0.1)
Basophils Relative: 0 %
Eosinophils Absolute: 0 10*3/uL (ref 0.0–0.5)
Eosinophils Relative: 0 %
HCT: 41.2 % (ref 39.0–52.0)
Hemoglobin: 13.5 g/dL (ref 13.0–17.0)
Immature Granulocytes: 1 %
Lymphocytes Relative: 4 %
Lymphs Abs: 0.4 10*3/uL — ABNORMAL LOW (ref 0.7–4.0)
MCH: 29 pg (ref 26.0–34.0)
MCHC: 32.8 g/dL (ref 30.0–36.0)
MCV: 88.4 fL (ref 80.0–100.0)
Monocytes Absolute: 0.6 10*3/uL (ref 0.1–1.0)
Monocytes Relative: 6 %
Neutro Abs: 9.2 10*3/uL — ABNORMAL HIGH (ref 1.7–7.7)
Neutrophils Relative %: 89 %
Platelets: 228 10*3/uL (ref 150–400)
RBC: 4.66 MIL/uL (ref 4.22–5.81)
RDW: 13.1 % (ref 11.5–15.5)
WBC: 10.4 10*3/uL (ref 4.0–10.5)
nRBC: 0 % (ref 0.0–0.2)

## 2019-09-23 LAB — CBC
HCT: 34.7 % — ABNORMAL LOW (ref 39.0–52.0)
Hemoglobin: 12 g/dL — ABNORMAL LOW (ref 13.0–17.0)
MCH: 29.6 pg (ref 26.0–34.0)
MCHC: 34.6 g/dL (ref 30.0–36.0)
MCV: 85.7 fL (ref 80.0–100.0)
Platelets: 184 10*3/uL (ref 150–400)
RBC: 4.05 MIL/uL — ABNORMAL LOW (ref 4.22–5.81)
RDW: 13 % (ref 11.5–15.5)
WBC: 13.2 10*3/uL — ABNORMAL HIGH (ref 4.0–10.5)
nRBC: 0.3 % — ABNORMAL HIGH (ref 0.0–0.2)

## 2019-09-23 LAB — LACTIC ACID, PLASMA: Lactic Acid, Venous: 1.5 mmol/L (ref 0.5–1.9)

## 2019-09-23 LAB — MRSA PCR SCREENING: MRSA by PCR: NEGATIVE

## 2019-09-23 SURGERY — LAPAROSCOPY, DIAGNOSTIC
Anesthesia: General

## 2019-09-23 MED ORDER — SUGAMMADEX SODIUM 500 MG/5ML IV SOLN
INTRAVENOUS | Status: AC
  Filled 2019-09-23: qty 5

## 2019-09-23 MED ORDER — CHLORHEXIDINE GLUCONATE CLOTH 2 % EX PADS: 6.0000 | MEDICATED_PAD | Freq: Once | CUTANEOUS | Status: DC

## 2019-09-23 MED ORDER — SODIUM CHLORIDE 0.9 % IV SOLN
2.0000 g | INTRAVENOUS | Status: AC
  Administered 2019-09-23: 2 g via INTRAVENOUS
  Filled 2019-09-23: qty 2

## 2019-09-23 MED ORDER — SUGAMMADEX SODIUM 500 MG/5ML IV SOLN
INTRAVENOUS | Status: DC | PRN
Start: 2019-09-23 — End: 2019-09-23
  Administered 2019-09-23: 263 mg via INTRAVENOUS

## 2019-09-23 MED ORDER — HYDROMORPHONE HCL 2 MG/ML IJ SOLN
INTRAMUSCULAR | Status: AC
  Filled 2019-09-23: qty 1

## 2019-09-23 MED ORDER — ROCURONIUM BROMIDE 10 MG/ML (PF) SYRINGE
PREFILLED_SYRINGE | INTRAVENOUS | Status: AC
  Filled 2019-09-23: qty 10

## 2019-09-23 MED ORDER — HYDROMORPHONE HCL 1 MG/ML IJ SOLN
INTRAMUSCULAR | Status: DC | PRN
  Administered 2019-09-23: 1 mg via INTRAVENOUS
  Administered 2019-09-23 (×2): .5 mg via INTRAVENOUS

## 2019-09-23 MED ORDER — MIDAZOLAM HCL 2 MG/2ML IJ SOLN
INTRAMUSCULAR | Status: AC
  Filled 2019-09-23: qty 2

## 2019-09-23 MED ORDER — HYDROMORPHONE HCL 1 MG/ML IJ SOLN
INTRAMUSCULAR | Status: AC
  Filled 2019-09-23: qty 1

## 2019-09-23 MED ORDER — BUPIVACAINE HCL (PF) 0.5 % IJ SOLN
INTRAMUSCULAR | Status: DC | PRN
  Administered 2019-09-23: 50 mL

## 2019-09-23 MED ORDER — PHENYLEPHRINE HCL-NACL 10-0.9 MG/250ML-% IV SOLN
INTRAVENOUS | Status: DC | PRN
Start: 2019-09-23 — End: 2019-09-23
  Administered 2019-09-23: 50 ug/min via INTRAVENOUS

## 2019-09-23 MED ORDER — DEXAMETHASONE SODIUM PHOSPHATE 10 MG/ML IJ SOLN
INTRAMUSCULAR | Status: AC
  Filled 2019-09-23: qty 1

## 2019-09-23 MED ORDER — LACTATED RINGERS IV SOLN: INTRAVENOUS | Status: DC

## 2019-09-23 MED ORDER — LACTATED RINGERS IV SOLN: INTRAVENOUS | Status: DC | PRN

## 2019-09-23 MED ORDER — LIDOCAINE 2% (20 MG/ML) 5 ML SYRINGE
INTRAMUSCULAR | Status: AC
  Filled 2019-09-23: qty 5

## 2019-09-23 MED ORDER — SODIUM CHLORIDE 0.9 % IR SOLN
Status: DC | PRN
  Administered 2019-09-23: 3000 mL

## 2019-09-23 MED ORDER — ONDANSETRON HCL 4 MG/2ML IJ SOLN
INTRAMUSCULAR | Status: DC | PRN
  Administered 2019-09-23: 4 mg via INTRAVENOUS

## 2019-09-23 MED ORDER — PHENYLEPHRINE 40 MCG/ML (10ML) SYRINGE FOR IV PUSH (FOR BLOOD PRESSURE SUPPORT)
PREFILLED_SYRINGE | INTRAVENOUS | Status: DC | PRN
  Administered 2019-09-23: 160 ug via INTRAVENOUS
  Administered 2019-09-23: 80 ug via INTRAVENOUS

## 2019-09-23 MED ORDER — ROCURONIUM BROMIDE 10 MG/ML (PF) SYRINGE
PREFILLED_SYRINGE | INTRAVENOUS | Status: DC | PRN
  Administered 2019-09-23: 20 mg via INTRAVENOUS
  Administered 2019-09-23: 50 mg via INTRAVENOUS
  Administered 2019-09-23: 30 mg via INTRAVENOUS

## 2019-09-23 MED ORDER — PHENYLEPHRINE HCL-NACL 10-0.9 MG/250ML-% IV SOLN
INTRAVENOUS | Status: AC
  Filled 2019-09-23: qty 250

## 2019-09-23 MED ORDER — FENTANYL CITRATE (PF) 250 MCG/5ML IJ SOLN
INTRAMUSCULAR | Status: DC | PRN
  Administered 2019-09-23: 150 ug via INTRAVENOUS
  Administered 2019-09-23: 100 ug via INTRAVENOUS

## 2019-09-23 MED ORDER — PROPOFOL 10 MG/ML IV BOLUS
INTRAVENOUS | Status: DC | PRN
  Administered 2019-09-23: 200 mg via INTRAVENOUS

## 2019-09-23 MED ORDER — DEXAMETHASONE SODIUM PHOSPHATE 10 MG/ML IJ SOLN
INTRAMUSCULAR | Status: DC | PRN
  Administered 2019-09-23: 8 mg via INTRAVENOUS

## 2019-09-23 MED ORDER — CHLORHEXIDINE GLUCONATE CLOTH 2 % EX PADS
6.0000 | MEDICATED_PAD | Freq: Every day | CUTANEOUS | Status: DC
  Administered 2019-09-25 – 2019-10-12 (×15): 6 via TOPICAL

## 2019-09-23 MED ORDER — ACETAMINOPHEN 650 MG RE SUPP: 325.0000 mg | Freq: Three times a day (TID) | RECTAL | Status: AC | PRN

## 2019-09-23 MED ORDER — ENOXAPARIN SODIUM 40 MG/0.4ML ~~LOC~~ SOLN
40.0000 mg | SUBCUTANEOUS | Status: DC
  Administered 2019-09-24 – 2019-10-12 (×19): 40 mg via SUBCUTANEOUS
  Filled 2019-09-23 (×16): qty 0.4

## 2019-09-23 MED ORDER — PHENYLEPHRINE 40 MCG/ML (10ML) SYRINGE FOR IV PUSH (FOR BLOOD PRESSURE SUPPORT)
PREFILLED_SYRINGE | INTRAVENOUS | Status: AC
  Filled 2019-09-23: qty 10

## 2019-09-23 MED ORDER — SUCCINYLCHOLINE CHLORIDE 200 MG/10ML IV SOSY
PREFILLED_SYRINGE | INTRAVENOUS | Status: AC
  Filled 2019-09-23: qty 10

## 2019-09-23 MED ORDER — MIDAZOLAM HCL 5 MG/5ML IJ SOLN
INTRAMUSCULAR | Status: DC | PRN
  Administered 2019-09-23: 1 mg via INTRAVENOUS

## 2019-09-23 MED ORDER — PROPOFOL 10 MG/ML IV BOLUS
INTRAVENOUS | Status: AC
  Filled 2019-09-23: qty 20

## 2019-09-23 MED ORDER — ONDANSETRON HCL 4 MG/2ML IJ SOLN
INTRAMUSCULAR | Status: AC
  Filled 2019-09-23: qty 2

## 2019-09-23 MED ORDER — LIDOCAINE 2% (20 MG/ML) 5 ML SYRINGE
INTRAMUSCULAR | Status: DC | PRN
  Administered 2019-09-23: 100 mg via INTRAVENOUS

## 2019-09-23 MED ORDER — SUCCINYLCHOLINE CHLORIDE 200 MG/10ML IV SOSY
PREFILLED_SYRINGE | INTRAVENOUS | Status: DC | PRN
  Administered 2019-09-23: 140 mg via INTRAVENOUS

## 2019-09-23 MED ORDER — HYDROMORPHONE HCL 1 MG/ML IJ SOLN
0.2500 mg | INTRAMUSCULAR | Status: DC | PRN
  Administered 2019-09-23 (×2): 0.5 mg via INTRAVENOUS

## 2019-09-23 MED ORDER — HYDROMORPHONE HCL 1 MG/ML IJ SOLN
1.0000 mg | INTRAMUSCULAR | Status: DC | PRN
  Administered 2019-09-23 – 2019-09-25 (×13): 1 mg via INTRAVENOUS
  Filled 2019-09-23 (×12): qty 1

## 2019-09-23 MED ORDER — FENTANYL CITRATE (PF) 250 MCG/5ML IJ SOLN
INTRAMUSCULAR | Status: AC
  Filled 2019-09-23: qty 5

## 2019-09-23 MED ORDER — BUPIVACAINE-EPINEPHRINE 0.25% -1:200000 IJ SOLN
INTRAMUSCULAR | Status: AC
  Filled 2019-09-23: qty 1

## 2019-09-23 MED ORDER — LACTATED RINGERS IV SOLN
INTRAVENOUS | Status: DC | PRN
Start: 2019-09-23 — End: 2019-09-23

## 2019-09-23 MED ORDER — ORAL CARE MOUTH RINSE
15.0000 mL | Freq: Two times a day (BID) | OROMUCOSAL | Status: DC
  Administered 2019-09-23 – 2019-10-12 (×31): 15 mL via OROMUCOSAL

## 2019-09-23 SURGICAL SUPPLY — 40 items
ADH SKN CLS APL DERMABOND .7 (GAUZE/BANDAGES/DRESSINGS) ×1
COVER WAND RF STERILE (DRAPES) IMPLANT
DECANTER SPIKE VIAL GLASS SM (MISCELLANEOUS) IMPLANT
DERMABOND ADVANCED (GAUZE/BANDAGES/DRESSINGS) ×2
DERMABOND ADVANCED .7 DNX12 (GAUZE/BANDAGES/DRESSINGS) IMPLANT
DEVICE SUTURE ENDOST 10MM (ENDOMECHANICALS) IMPLANT
DRAIN CHANNEL 19F RND (DRAIN) ×4 IMPLANT
DRAPE WARM FLUID 44X44 (DRAPES) ×3 IMPLANT
DRSG TEGADERM 2-3/8X2-3/4 SM (GAUZE/BANDAGES/DRESSINGS) ×6 IMPLANT
DRSG TEGADERM 4X4.75 (GAUZE/BANDAGES/DRESSINGS) ×3 IMPLANT
ELECT REM PT RETURN 15FT ADLT (MISCELLANEOUS) ×3 IMPLANT
EVACUATOR SILICONE 100CC (DRAIN) ×6 IMPLANT
GAUZE SPONGE 2X2 8PLY STRL LF (GAUZE/BANDAGES/DRESSINGS) IMPLANT
GLOVE BIOGEL PI IND STRL 7.0 (GLOVE) ×1 IMPLANT
GLOVE BIOGEL PI INDICATOR 7.0 (GLOVE) ×2
GLOVE INDICATOR 8.0 STRL GRN (GLOVE) ×6 IMPLANT
GLOVE SS BIOGEL STRL SZ 7.5 (GLOVE) ×1 IMPLANT
GLOVE SUPERSENSE BIOGEL SZ 7.5 (GLOVE) ×2
GOWN STRL REUS W/TWL LRG LVL3 (GOWN DISPOSABLE) ×3 IMPLANT
GOWN STRL REUS W/TWL XL LVL3 (GOWN DISPOSABLE) ×6 IMPLANT
IRRIG SUCT STRYKERFLOW 2 WTIP (MISCELLANEOUS)
IRRIGATION SUCT STRKRFLW 2 WTP (MISCELLANEOUS) IMPLANT
KIT BASIN OR (CUSTOM PROCEDURE TRAY) ×3 IMPLANT
KIT TURNOVER KIT A (KITS) IMPLANT
SET TUBE SMOKE EVAC HIGH FLOW (TUBING) ×3 IMPLANT
SHEARS HARMONIC ACE PLUS 36CM (ENDOMECHANICALS) IMPLANT
SLEEVE XCEL OPT CAN 5 100 (ENDOMECHANICALS) ×4 IMPLANT
SOL ANTI FOG 6CC (MISCELLANEOUS) ×1 IMPLANT
SOLUTION ANTI FOG 6CC (MISCELLANEOUS) ×2
SPONGE GAUZE 2X2 STER 10/PKG (GAUZE/BANDAGES/DRESSINGS) ×2
SUT ETHILON 2 0 PS N (SUTURE) ×9 IMPLANT
SUT MNCRL AB 4-0 PS2 18 (SUTURE) ×2 IMPLANT
SUT VIC AB 4-0 PS2 27 (SUTURE) IMPLANT
TOWEL OR 17X26 10 PK STRL BLUE (TOWEL DISPOSABLE) ×3 IMPLANT
TRAY FOLEY MTR SLVR 16FR STAT (SET/KITS/TRAYS/PACK) IMPLANT
TRAY LAPAROSCOPIC (CUSTOM PROCEDURE TRAY) ×3 IMPLANT
TROCAR BLADELESS OPT 5 100 (ENDOMECHANICALS) ×3 IMPLANT
TROCAR XCEL BLUNT TIP 100MML (ENDOMECHANICALS) ×3 IMPLANT
TROCAR XCEL NON-BLD 11X100MML (ENDOMECHANICALS) ×2 IMPLANT
TROCAR XCEL UNIV SLVE 11M 100M (ENDOMECHANICALS) IMPLANT

## 2019-09-23 NOTE — Transfer of Care (Signed)
Immediate Anesthesia Transfer of Care Note  Patient: Brad Hughes  Procedure(s) Performed: Procedure(s): LAPAROSCOPY DIAGNOSTIC (N/A)  Patient Location: PACU  Anesthesia Type:General  Level of Consciousness:  sedated, patient cooperative and responds to stimulation  Airway & Oxygen Therapy:Patient Spontanous Breathing and Patient connected to face mask oxgen  Post-op Assessment:  Report given to PACU RN and Post -op Vital signs reviewed and stable  Post vital signs:  Reviewed and stable  Last Vitals:  Vitals:   09/23/19 1630 09/23/19 2017  BP: 115/80 129/68  Pulse: (!) 145 (!) 121  Resp: 18 20  Temp: 37.9 C (P) 37.1 C  SpO2:  95%    Complications: No apparent anesthesia complications

## 2019-09-23 NOTE — Progress Notes (Signed)
eLink Physician-Brief Progress Note Patient Name: Brad Hughes DOB: 09-Mar-1976 MRN: [id number]   Date of Service  09/23/2019  HPI/Events of Note  44 yr old male s/p Gastric bypass on 14 th, now ex lap-GJ anastomosis leak-peritonitis repair-lavage. Sepsis. On zosyn/fluids.   Camera: Morbidly obese. Discussed with bed side RN. HR 127, 125/62. sats ok.   Data reviewed.  eICU Interventions  - notified bed side ground team ICU to go to room for evaluation - continue LR - get LA. - keep MAP > 65. - follow cultures. - received abx in OR. - asp precautions - VTE: SCD for now.      Intervention Category Major Interventions: Sepsis - evaluation and management Evaluation Type: New Patient Evaluation  Ranee Gosselin 09/23/2019, 9:41 PM

## 2019-09-23 NOTE — Progress Notes (Signed)
eLink Physician-Brief Progress Note Patient Name: Brad Hughes DOB: 03/04/1976 MRN: [id number]   Date of Service  09/23/2019  HPI/Events of Note  Dr Luisa Hart, Surgeon wanted our support consult for  1. Ex lap, lavage, GJ anastomosis breakdown repair- 7/14 R-Y GJ bypass, morbid obesity. Received zosyn, going to step down.HR 120's. MAP is fine. Not on presors.   Labs , meds reviewed.   eICU Interventions  - discussed with ground team ICU- NP Olegario Messier. She will discuss with MD .       Intervention Category Minor Interventions: Communication with other healthcare providers and/or family  Ranee Gosselin 09/23/2019, 8:56 PM

## 2019-09-23 NOTE — Consult Note (Signed)
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  Now s/p laparoscopy and found to have GJ anastomotic leak with copious abd cavity contamination and peritonitis.  Concern for worsening sepsis.  History of present illness   44 year old man with history of obesity s/p recent lap roux-en-y gastric bypass 7/14 (performed in [place], discharged same day) presented 7/16 to ER with abd pain, tachycardia, dehydration.  CTA neg for PE and initial CT abd neg for post-op leak. He was initially admitted for 24h OBS but developed worsening abd pain and distention, tachycardia 150's. He was taken to OR for laparoscopy and found to have GJ anastomotic leak with copious abd cavity contamination and peritonitis.  He remained tachycardic post op with concern for potential clinical decline given copious abd spillage and PCCM asked to consult for mgmt SIRS.   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   Significant Hospital Events   7/16> admit  Consults:  7/17 PCCM  Procedures:  7/17 OR >> Diagnostic laparoscopy with placement of drainage tubes and irrigation and debridement of intra-abdominal abscess and EGD  Significant Diagnostic Tests:  CTA chest/abd/pelvis 7/16 >> Limited study demonstrating no central, lobar or proximal segmental sized pulmonary embolism. The appearance of the dependent portions of the lungs suggest sequela of aspiration with a combination of atelectasis and airspace consolidation bilaterally in the lower lobes. Postoperative changes of recent Roux-en-Y gastric bypass with small to  moderate volume of pneumoperitoneum and a small amount of intramural gas along the anterior wall of the gastric antrum, presumably all postoperative in nature. There is also a small volume of ascites. Severe hepatic steatosis. Mild colonic diverticulosis.  Micro Data:  BC x 2 7/16>> covid 7/16>> neg   Antimicrobials:  Zosyn 7/17>>  Interim history/subjective:  He denies chest pain or dyspnea. He has some abdominal discomfort  Objective   Blood pressure 126/73, pulse (!) 128, temperature 99.3 F (37.4 C), resp. rate 18, height 5\' 10"  (1.778 m), weight 131.5 kg, SpO2 95 %.        Intake/Output Summary (Last 24 hours) at 09/23/2019 2136 Last data filed at 09/23/2019 2115 Gross per 24 hour  Intake 3885.83 ml  Output 445 ml  Net 3440.83 ml   Filed Weights   09/22/19 09/24/19  Weight: 131.5 kg    Examination: General: NAD, awake in bed HENT: [place]/AT Lungs: Clear bilaterally Cardiovascular: Tachycardic rate Abdomen: Soft, distended, some tenderness to palpation, incisions c/d/i, drains in place Extremities: Trace LE edema Neuro: Awake, alert, answering questions appropriately, moving all 4 extremities  Assessment & Plan:  44 year old man with history of obesity s/p recent lap roux-en-y gastric bypass 7/14 (performed in 8/14, discharged same day) presented 7/16 with abd pain, tachycardia, dehydration.  Now s/p laparoscopy and found to have GJ anastomotic leak and had placement of drainage tubes, irrigation and debridment.  Sepsis, Peritonitis - in setting anastomotic leak from rou-en-y gastric bypass now s/p repair, washout and drain placement 7/17 --Continue abx per primary --Gentle volume  --Check lactic acid --Goal MAP >65, consider levophed if MAP <65 --Pain mgmt and post op care per primary  Hyperglycemia - mild --Monitor, SSI if persists   Best practice:  Diet: NPO Pain/Anxiety/Delirium protocol (if indicated): prn dilaudid VAP protocol (if indicated): n/a DVT prophylaxis:  per primary GI prophylaxis: Protonix when able Glucose control: monitor Mobility: OOB per surgery  Code Status: full Family Communication: Discussed with patient Disposition: Step down  Labs   CBC: Recent Labs  Lab 09/22/19 0623 09/23/19 0557 09/23/19 1746  WBC 9.4 13.2* 10.4  NEUTROABS  --   --  9.2*  HGB 12.7* 12.0* 13.5  HCT 37.3* 34.7* 41.2  MCV 86.9 85.7 88.4  PLT 193 184 228    Basic Metabolic Panel: Recent Labs  Lab 09/22/19 0623 09/23/19 0557 09/23/19 1746  NA 138 133* 136  K 3.3* 4.2 4.0  CL 104 100 101  CO2 22 25 21*  GLUCOSE 132* 136* 149*  BUN 16 15 17   CREATININE 0.94 0.94 1.30*  CALCIUM 8.3* 8.4* 8.7*  MG  --  2.0  --    GFR: Estimated Creatinine Clearance: 99.9 mL/min (A) (by C-G formula based on SCr of 1.3 mg/dL (H)). Recent Labs  Lab 09/22/19 0623 09/22/19 0631 09/22/19 0831 09/23/19 0557 09/23/19 1746  WBC 9.4  --   --  13.2* 10.4  LATICACIDVEN  --  1.1 1.3  --   --     Liver Function Tests: Recent Labs  Lab 09/22/19 0623 09/23/19 0557 09/23/19 1746  AST 24 18 13*  ALT 56* 40 34  ALKPHOS 110 93 93  BILITOT 3.8* 3.1* 3.5*  PROT 7.6 7.0 7.0  ALBUMIN 3.5 2.9* 3.0*   Recent Labs  Lab 09/22/19 0623  LIPASE 31    Coagulation Profile: Recent Labs  Lab 09/22/19 0623  INR 1.1     Review of Systems:   Complete review of system performed and negative except per HPI  Past Medical History  He,  has a past medical history of Anal fistula (2017), Hyperlipidemia, and 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   Surgical History    Past Surgical History:  Procedure Laterality Date   ANAL FISTULOTOMY N/A 12/26/2015   Procedure: FISTULOTOMY;  Surgeon: 12/28/2015, MD;  Location: [facility] [place];  Service: General;  Laterality: N/A;   RECTAL EXAM UNDER ANESTHESIA  2015   RECTAL EXAM UNDER ANESTHESIA N/A 12/26/2015   Procedure: RECTAL EXAM UNDER ANESTHESIA;  Surgeon: 12/28/2015, MD;  Location: [facility] [place];   Service: General;  Laterality: N/A;   TREATMENT FISTULA ANAL       Social History   reports that he quit smoking about 10 years ago. His smoking use included cigarettes. He has never used smokeless tobacco. He reports that he does not drink alcohol.   Family History   His family history is not on file.   Allergies No Known Allergies   Home Medications  Prior to Admission medications   Medication Sig Start Date End Date Taking? Authorizing Provider  hyoscyamine (LEVSIN SL) 0.125 MG SL tablet Place 0.125 mg under the tongue every 6 (six) hours. 09/19/19  Yes [provider]  ibuprofen (ADVIL) 100 MG/5ML suspension Take 30 mLs by mouth every 6 (six) hours as needed for pain. 09/19/19  Yes [provider]  ondansetron (ZOFRAN-ODT) 4 MG disintegrating tablet Take 4 mg by mouth every 6 (six) hours as needed for nausea/vomiting. 09/19/19  Yes [provider]  pantoprazole (PROTONIX) 40 MG tablet Take 40 mg by mouth daily. 09/19/19  Yes [provider]  cephALEXin (KEFLEX) 500 MG capsule  Take 1 capsule (500 mg total) by mouth 3 (three) times daily. Patient not taking: Reported on 09/22/2019 02/23/14   Vivi Barrack, DPM  HYDROcodone-acetaminophen (NORCO/VICODIN) 5-325 MG tablet Take 1-2 tablets by mouth every 4 (four) hours as needed. Patient not taking: Reported on 09/22/2019 12/26/15   Romie Levee, MD  oxyCODONE-acetaminophen (PERCOCET/ROXICET) 5-325 MG tablet Take 1 tablet by mouth every 8 (eight) hours as needed for pain. 09/19/19   [provider]  PROMETHEGAN 25 MG suppository Place 25 mg rectally every 12 (twelve) hours as needed for nausea/vomiting. 09/19/19   [provider]     Critical care time: The patient is critically ill with multiple organ systems failure and requires high complexity decision making for assessment and support, frequent evaluation and titration of therapies, application of advanced monitoring technologies and  extensive interpretation of multiple databases.   Critical Care Time devoted to patient care services described in this note is  35 Minutes. This time reflects time of care of this signee. This critical care time does not reflect procedure time, or teaching time or supervisory time of PA/NP/Med student/Med Resident etc but could involve care discussion time.  Griffin Basil, M.D. [facility] Pulmonary/Critical Care Medicine After hours pager: [phone].

## 2019-09-23 NOTE — Progress Notes (Signed)
Subjective/Chief Complaint: Patient feels better Patient feels better after receiving IV fluids.  He still has some abdominal pain but it seems better he states.  He is somewhat bloated he thinks.  He has no more nausea vomiting.   Objective: Vital signs in last 24 hours: Temp:  [97.7 F (36.5 C)-99 F (37.2 C)] 98.6 F (37 C) (07/17 1015) Pulse Rate:  [90-118] 90 (07/17 1015) Resp:  [18-32] 18 (07/17 1015) BP: (95-152)/(67-89) 122/68 (07/17 1015) SpO2:  [93 %-100 %] 99 % (07/17 1015) Last BM Date: 09/18/19  Intake/Output from previous day: 07/16 0701 - 07/17 0700 In: 2787.9 [P.O.:480; I.V.:1257.9; IV Piggyback:1050] Out: 0  Intake/Output this shift: No intake/output data recorded.  General appearance: alert and cooperative Resp: clear to auscultation bilaterally Cardio: regular rate and rhythm, S1, S2 normal, no murmur, click, rub or gallop Incision/Wound: Port sites clean dry intact.  He is distended without peritonitis.  Lab Results:  Recent Labs    09/22/19 0623 09/23/19 0557  WBC 9.4 13.2*  HGB 12.7* 12.0*  HCT 37.3* 34.7*  PLT 193 184   BMET Recent Labs    09/22/19 0623 09/23/19 0557  NA 138 133*  K 3.3* 4.2  CL 104 100  CO2 22 25  GLUCOSE 132* 136*  BUN 16 15  CREATININE 0.94 0.94  CALCIUM 8.3* 8.4*   PT/INR Recent Labs    09/22/19 0623  LABPROT 13.7  INR 1.1   ABG No results for input(s): PHART, HCO3 in the last 72 hours.  Invalid input(s): PCO2, PO2  Studies/Results: DG Chest 2 View  Result Date: 09/22/2019 CLINICAL DATA:  [age] year old male with history of epigastric pain. Suspected sepsis. EXAM: CHEST - 2 VIEW COMPARISON:  No priors. FINDINGS: Low lung volumes. Atelectasis and/or consolidation in the medial aspect of the left lower lobe. Right lung is clear. Trace bilateral pleural effusions. No evidence of pulmonary edema. No pneumothorax. No suspicious appearing pulmonary nodules or masses are noted. Heart size is normal. Upper  mediastinal contours are within normal limits. IMPRESSION: 1. Low lung volumes with atelectasis and/or consolidation in the medial aspect of the left lower lobe. 2. Trace bilateral pleural effusions. Electronically Signed   By: Trudie Reedaniel  Entrikin M.D.   On: 09/22/2019 07:29   CT Angio Chest PE W/Cm &/Or Wo Cm  Result Date: 09/22/2019 CLINICAL DATA:  [age] year old male with history of abdominal pain and fever. Status post gastric bypass procedure 2 days ago. Shortness of breath. EXAM: CT ANGIOGRAPHY CHEST CT ABDOMEN AND PELVIS WITH CONTRAST TECHNIQUE: Multidetector CT imaging of the chest was performed using the standard protocol during bolus administration of intravenous contrast. Multiplanar CT image reconstructions and MIPs were obtained to evaluate the vascular anatomy. Multidetector CT imaging of the abdomen and pelvis was performed using the standard protocol during bolus administration of intravenous contrast. CONTRAST:  100mL OMNIPAQUE IOHEXOL 350 MG/ML SOLN COMPARISON:  None. FINDINGS: CTA CHEST FINDINGS Comment: Poor quality examination secondary to a combination of suboptimal contrast bolus and extensive patient respiratory motion. Cardiovascular: No central, lobar or proximal segmental sized filling defects are noted to suggest pulmonary embolism. Distal segmental and subsegmental sized emboli cannot be entirely excluded secondary to extensive patient respiratory motion. Heart size is normal. There is no significant pericardial fluid, thickening or pericardial calcification. No atherosclerotic calcifications in the thoracic aorta or the coronary arteries. Mediastinum/Nodes: No pathologically enlarged mediastinal or hilar lymph nodes. Esophagus is unremarkable in appearance. No axillary lymphadenopathy. Lungs/Pleura: Dependent opacities and volume loss in the lower  lobes of the lungs bilaterally, compatible with a combination of airspace consolidation and atelectasis, suspicious for sequela of recent  aspiration. Trace left pleural effusion. Musculoskeletal: There are no aggressive appearing lytic or blastic lesions noted in the visualized portions of the skeleton. Review of the MIP images confirms the above findings. CT ABDOMEN and PELVIS FINDINGS Hepatobiliary: Diffuse low attenuation throughout the hepatic parenchyma, indicative of hepatic steatosis. No cystic or solid hepatic lesions. No intra or extrahepatic biliary ductal dilatation. Gallbladder is normal in appearance. Pancreas: No pancreatic mass. No pancreatic ductal dilatation. No pancreatic or peripancreatic fluid collections or inflammatory changes. Spleen: Unremarkable. Adrenals/Urinary Tract: Bilateral kidneys and adrenal glands are normal in appearance. No hydroureteronephrosis. Urinary bladder is normal in appearance. 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 Intramural gas along the anterior aspect of the gastric antrum best appreciated on axial image 29 of series 2. Small to moderate amount of pneumoperitoneum, particularly in the upper abdomen adjacent to the surgical site. Some mildly dilated loops of jejunum are noted, presumably mild postoperative ileus. No other pathologic dilatation of small bowel or colon. A few scattered colonic diverticuli are evident, without surrounding inflammatory changes to clearly indicate an acute diverticulitis at this time. Normal appendix. Vascular/Lymphatic: No significant atherosclerotic disease, aneurysm or dissection noted in the abdominal or pelvic vasculature. No lymphadenopathy noted in the abdomen or pelvis. Reproductive: Prostate gland and seminal vesicles are unremarkable in appearance. Other: Small to moderate amount of pneumoperitoneum, presumably related to recent surgery. Trace volume of ascites. Musculoskeletal: There are no aggressive appearing lytic or blastic lesions noted in the visualized portions of the skeleton. Review of the MIP images confirms the above  findings. IMPRESSION: 1. Limited study demonstrating no central, lobar or proximal segmental sized pulmonary embolism. 2. The appearance of the dependent portions of the lungs suggest sequela of aspiration with a combination of atelectasis and airspace consolidation bilaterally in the lower lobes. 3. 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 There is also a small volume of ascites. 4. Severe hepatic steatosis. 5. Mild colonic diverticulosis without evidence to suggest an acute diverticulitis at this time. Electronically Signed   By: Trudie Reed M.D.   On: 09/22/2019 10:44   CT Abdomen Pelvis W Contrast  Result Date: 09/22/2019 CLINICAL DATA:  44 year old male with history of abdominal pain and fever. Status post gastric bypass procedure 2 days ago. Shortness of breath. EXAM: CT ANGIOGRAPHY CHEST CT ABDOMEN AND PELVIS WITH CONTRAST TECHNIQUE: Multidetector CT imaging of the chest was performed using the standard protocol during bolus administration of intravenous contrast. Multiplanar CT image reconstructions and MIPs were obtained to evaluate the vascular anatomy. Multidetector CT imaging of the abdomen and pelvis was performed using the standard protocol during bolus administration of intravenous contrast. CONTRAST:  OMNIPAQUE IOHEXOL 350 MG/ML SOLN COMPARISON:  None. FINDINGS: CTA CHEST FINDINGS Comment: Poor quality examination secondary to a combination of suboptimal contrast bolus and extensive patient respiratory motion. Cardiovascular: No central, lobar or proximal segmental sized filling defects are noted to suggest pulmonary embolism. Distal segmental and subsegmental sized emboli cannot be entirely excluded secondary to extensive patient respiratory motion. Heart size is normal. There is no significant  pericardial fluid, thickening or pericardial calcification. No atherosclerotic calcifications in the thoracic aorta or the coronary arteries. Mediastinum/Nodes: No pathologically enlarged mediastinal or hilar lymph nodes. Esophagus  is unremarkable in appearance. No axillary lymphadenopathy. Lungs/Pleura: Dependent opacities and volume loss in the lower lobes of the lungs bilaterally, compatible with a combination of airspace consolidation and atelectasis, suspicious for sequela of recent aspiration. Trace left pleural effusion. Musculoskeletal: There are no aggressive appearing lytic or blastic lesions noted in the visualized portions of the skeleton. Review of the MIP images confirms the above findings. CT ABDOMEN and PELVIS FINDINGS Hepatobiliary: Diffuse low attenuation throughout the hepatic parenchyma, indicative of hepatic steatosis. No cystic or solid hepatic lesions. No intra or extrahepatic biliary ductal dilatation. Gallbladder is normal in appearance. Pancreas: No pancreatic mass. No pancreatic ductal dilatation. No pancreatic or peripancreatic fluid collections or inflammatory changes. Spleen: Unremarkable. Adrenals/Urinary Tract: Bilateral kidneys and adrenal glands are normal in appearance. No hydroureteronephrosis. Urinary bladder is normal in appearance. 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 Intramural gas along the anterior aspect of the gastric antrum best appreciated on axial image 29 of series 2. Small to moderate amount of pneumoperitoneum, particularly in the upper abdomen adjacent to the surgical site. Some mildly dilated loops of jejunum are noted, presumably mild postoperative ileus. No other pathologic dilatation of small bowel or colon. A few scattered colonic diverticuli are evident, without surrounding inflammatory changes to clearly indicate an acute diverticulitis at this time. Normal appendix. Vascular/Lymphatic: No significant atherosclerotic  disease, aneurysm or dissection noted in the abdominal or pelvic vasculature. No lymphadenopathy noted in the abdomen or pelvis. Reproductive: Prostate gland and seminal vesicles are unremarkable in appearance. Other: Small to moderate amount of pneumoperitoneum, presumably related to recent surgery. Trace volume of ascites. Musculoskeletal: There are no aggressive appearing lytic or blastic lesions noted in the visualized portions of the skeleton. Review of the MIP images confirms the above findings. IMPRESSION: 1. Limited study demonstrating no central, lobar or proximal segmental sized pulmonary embolism. 2. The appearance of the dependent portions of the lungs suggest sequela of aspiration with a combination of atelectasis and airspace consolidation bilaterally in the lower lobes. 3. 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 There is also a small volume of ascites. 4. Severe hepatic steatosis. 5. Mild colonic diverticulosis without evidence to suggest an acute diverticulitis at this time. Electronically Signed   By: Trudie Reed M.D.   On: 09/22/2019 10:44    Anti-infectives: Anti-infectives (From admission, onward)   Start     Dose/Rate Route Frequency Ordered Stop   09/22/19 0915  piperacillin-tazobactam (ZOSYN) IVPB 3.375 g        3.375 g 100 mL/hr over 30 Minutes Intravenous  Once 09/22/19 0906 09/22/19 1001      Assessment/Plan:     Obesity  Abdominal pain Tachycardia  Dehydration POD#3 s/p Roux-en-Y gastric bypass 09/20/19 in [place] by Dr. Monico Blitz  - CT abdomen pelvis negative for post-operative leak, CT angio negative for PE - no acute surgical needs. Admit to observation and HR and RR overnight. Suspect the patients tachycardia is related to dehydration and post-operative pain.  -  pain control, PRN antiemetics  - PPI  Patient feels better but abdominal pain is not completely resolved. Continue bariatric full liquid diet  Continue IV fluids for now  Plan for discharge in a.m. as long as oral intake is adequate and pain stable FEN: IVF 100 cc/hr, bariatric FLD diet, ensure max BID, hypokalemia (3.3) replete PO  ID: none, no concerns for acute infection VTE: SCD's Lovenox    LOS: 0 days    Dortha Schwalbe MD 09/23/2019

## 2019-09-23 NOTE — Progress Notes (Signed)
Patient remains febrile with low grade temp; tachycardia worsened with HR now reading upper 140's. Patient is denying increased pain; denies chest pain or SOB. EKG done and shows Sinus Tachycardia; Dr Luisa Hart advised via phone of the situation; states to keep patient NPO for possible scope procedure; will continue to monitor

## 2019-09-23 NOTE — Progress Notes (Signed)
eLink Physician-Brief Progress Note Patient Name: Brad Hughes DOB: Apr 01, 1975 MRN: [id number]   Date of Service  09/23/2019  HPI/Events of Note  RN said pt is NPO and has po Tylenol and Colace ordered  eICU Interventions  Hold Colace. Tylenol PR ordered prn for fever > 100.4, please notify.  Discussed with bed side RN.       Intervention Category Minor Interventions: Other:  Ranee Gosselin 09/23/2019, 10:30 PM

## 2019-09-23 NOTE — Progress Notes (Signed)
Called by nurse to see patient due to tachycardia in the 150s.  EKG shows sinus tachycardia without A. fib.  He still has abdominal pain.  The chest CT yesterday was negative for PE and abdominal CT scan was normal.  He is more distention more abdominal pain with worsening tachycardia despite fluid resuscitation.  I discussed laparoscopy and situation to exclude a leak from his pouch and/or JJ.  He is in agreement to proceed.The procedure has been discussed with the patient.  Alternative therapies have been discussed with the patient.  Operative risks include bleeding,  Infection, possible open surgery possible revision organ injury,  Nerve injury,  Blood vessel injury,  DVT,  Pulmonary embolism,  Death,  And possible reoperation.  Medical management risks include worsening of present situation.  The success of the procedure is 50 -90 % at treating patients symptoms.  The patient understands and agrees to proceed.
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
## 2019-09-23 NOTE — Progress Notes (Signed)
Patient aware of pending procedure; he called his wife on phone tp make her aware; Patient alert, calm, not in distress.  Patient transported to OR in bed. Met at elevator by OR staff who continued the transfer.

## 2019-09-23 NOTE — Anesthesia Procedure Notes (Signed)
Procedure Name: Intubation Date/Time: 09/23/2019 6:26 PM Performed by: Anne Fu, CRNA Pre-anesthesia Checklist: Patient identified, Emergency Drugs available, Suction available, Patient being monitored and Timeout performed Patient Re-evaluated:Patient Re-evaluated prior to induction Oxygen Delivery Method: Circle system utilized Preoxygenation: Pre-oxygenation with 100% oxygen Induction Type: IV induction, Rapid sequence and Cricoid Pressure applied Laryngoscope Size: Mac and 4 Grade View: Grade II Tube type: Oral Tube size: 7.5 mm Number of attempts: 1 Airway Equipment and Method: Stylet Placement Confirmation: ETT inserted through vocal cords under direct vision,  positive ETCO2 and breath sounds checked- equal and bilateral Secured at: 22 cm Tube secured with: Tape Dental Injury: Teeth and Oropharynx as per pre-operative assessment

## 2019-09-23 NOTE — Anesthesia Postprocedure Evaluation (Signed)
Anesthesia Post Note  Patient: Brad Hughes  Procedure(s) Performed: LAPAROSCOPY DIAGNOSTIC, EGD, Drainage of intra-abdominal abscess (N/A )     Patient location during evaluation: PACU Anesthesia Type: General Level of consciousness: awake and alert, awake and oriented Pain management: pain level controlled Vital Signs Assessment: post-procedure vital signs reviewed and stable Respiratory status: spontaneous breathing, nonlabored ventilation, respiratory function stable and patient connected to nasal cannula oxygen Cardiovascular status: blood pressure returned to baseline, stable and tachycardic Postop Assessment: no apparent nausea or vomiting Anesthetic complications: no   No complications documented.  Last Vitals:  Vitals:   09/23/19 2030 09/23/19 2045  BP: 115/69 120/75  Pulse: (!) 127 (!) 127  Resp: 13 15  Temp:    SpO2: 94% 98%    Last Pain:  Vitals:   09/23/19 2045  TempSrc:   PainSc: 4                  Cecile Hearing

## 2019-09-23 NOTE — Anesthesia Preprocedure Evaluation (Addendum)
Anesthesia Evaluation  Patient identified by MRN, date of birth, ID band Patient awake    Reviewed: Allergy & Precautions, NPO status , Patient's Chart, lab work & pertinent test results  History of Anesthesia Complications Negative for: history of anesthetic complications  Airway Mallampati: III  TM Distance: >3 FB Neck ROM: Full    Dental  (+) Teeth Intact, Dental Advisory Given   Pulmonary sleep apnea and Continuous Positive Airway Pressure Ventilation , former smoker,    Pulmonary exam normal breath sounds clear to auscultation       Cardiovascular negative cardio ROS   Rhythm:Regular Rate:Tachycardia     Neuro/Psych negative neurological ROS     GI/Hepatic Neg liver ROS, GERD  Medicated,S/p Roux-en-y 3 days ago   Endo/Other  Morbid obesity  Renal/GU negative Renal ROS     Musculoskeletal negative musculoskeletal ROS (+)   Abdominal   Peds  Hematology  (+) Blood dyscrasia, anemia ,   Anesthesia Other Findings Day of surgery medications reviewed with the patient.  Reproductive/Obstetrics                            Anesthesia Physical Anesthesia Plan  ASA: III  Anesthesia Plan: General   Post-op Pain Management:    Induction: Intravenous  PONV Risk Score and Plan: 3 and Midazolam, Dexamethasone and Ondansetron  Airway Management Planned: Oral ETT  Additional Equipment: Arterial line  Intra-op Plan:   Post-operative Plan: Possible Post-op intubation/ventilation  Informed Consent: I have reviewed the patients History and Physical, chart, labs and discussed the procedure including the risks, benefits and alternatives for the proposed anesthesia with the patient or authorized representative who has indicated his/her understanding and acceptance.     Dental advisory given  Plan Discussed with: CRNA  Anesthesia Plan Comments:         Anesthesia Quick  Evaluation

## 2019-09-24 ENCOUNTER — Inpatient Hospital Stay: Payer: Self-pay

## 2019-09-24 ENCOUNTER — Encounter (HOSPITAL_COMMUNITY): Payer: Self-pay | Admitting: Surgery

## 2019-09-24 DIAGNOSIS — R918 Other nonspecific abnormal finding of lung field: Secondary | ICD-10-CM | POA: Diagnosis not present

## 2019-09-24 DIAGNOSIS — T8144XA Sepsis following a procedure, initial encounter: Secondary | ICD-10-CM | POA: Diagnosis present

## 2019-09-24 DIAGNOSIS — J9601 Acute respiratory failure with hypoxia: Secondary | ICD-10-CM | POA: Diagnosis present

## 2019-09-24 LAB — COMPREHENSIVE METABOLIC PANEL
ALT: 44 U/L (ref 0–44)
ALT: 39 U/L (ref 0–44)
AST: 34 U/L (ref 15–41)
AST: 20 U/L (ref 15–41)
Albumin: 2.6 g/dL — ABNORMAL LOW (ref 3.5–5.0)
Albumin: 2.7 g/dL — ABNORMAL LOW (ref 3.5–5.0)
Alkaline Phosphatase: 81 U/L (ref 38–126)
Alkaline Phosphatase: 90 U/L (ref 38–126)
Anion gap: 10 (ref 5–15)
Anion gap: 10 (ref 5–15)
BUN: 20 mg/dL (ref 6–20)
BUN: 20 mg/dL (ref 6–20)
CO2: 23 mmol/L (ref 22–32)
CO2: 26 mmol/L (ref 22–32)
Calcium: 8 mg/dL — ABNORMAL LOW (ref 8.9–10.3)
Calcium: 8.2 mg/dL — ABNORMAL LOW (ref 8.9–10.3)
Chloride: 101 mmol/L (ref 98–111)
Chloride: 101 mmol/L (ref 98–111)
Creatinine, Ser: 1.64 mg/dL — ABNORMAL HIGH (ref 0.61–1.24)
Creatinine, Ser: 1.44 mg/dL — ABNORMAL HIGH (ref 0.61–1.24)
GFR calc Af Amer: 58 mL/min — ABNORMAL LOW (ref >60)
GFR calc Af Amer: >60 mL/min (ref >60)
GFR calc non Af Amer: 50 mL/min — ABNORMAL LOW (ref >60)
GFR calc non Af Amer: 59 mL/min — ABNORMAL LOW (ref >60)
Glucose, Bld: 152 mg/dL — ABNORMAL HIGH (ref 70–99)
Glucose, Bld: 172 mg/dL — ABNORMAL HIGH (ref 70–99)
Potassium: 4.3 mmol/L (ref 3.5–5.1)
Potassium: 4.6 mmol/L (ref 3.5–5.1)
Sodium: 134 mmol/L — ABNORMAL LOW (ref 135–145)
Sodium: 137 mmol/L (ref 135–145)
Total Bilirubin: 3 mg/dL — ABNORMAL HIGH (ref 0.3–1.2)
Total Bilirubin: 2.9 mg/dL — ABNORMAL HIGH (ref 0.3–1.2)
Total Protein: 6.3 g/dL — ABNORMAL LOW (ref 6.5–8.1)
Total Protein: 6.7 g/dL (ref 6.5–8.1)

## 2019-09-24 LAB — CBC
HCT: 41.4 % (ref 39.0–52.0)
HCT: 40.2 % (ref 39.0–52.0)
Hemoglobin: 13.6 g/dL (ref 13.0–17.0)
Hemoglobin: 13.3 g/dL (ref 13.0–17.0)
MCH: 29.3 pg (ref 26.0–34.0)
MCH: 29.2 pg (ref 26.0–34.0)
MCHC: 32.9 g/dL (ref 30.0–36.0)
MCHC: 33.1 g/dL (ref 30.0–36.0)
MCV: 89.2 fL (ref 80.0–100.0)
MCV: 88.4 fL (ref 80.0–100.0)
Platelets: 214 10*3/uL (ref 150–400)
Platelets: 228 10*3/uL (ref 150–400)
RBC: 4.64 MIL/uL (ref 4.22–5.81)
RBC: 4.55 MIL/uL (ref 4.22–5.81)
RDW: 13.2 % (ref 11.5–15.5)
RDW: 13.3 % (ref 11.5–15.5)
WBC: 12.3 10*3/uL — ABNORMAL HIGH (ref 4.0–10.5)
WBC: 14.1 10*3/uL — ABNORMAL HIGH (ref 4.0–10.5)
nRBC: 0 % (ref 0.0–0.2)
nRBC: 0 % (ref 0.0–0.2)

## 2019-09-24 LAB — GLUCOSE, CAPILLARY: Glucose-Capillary: 121 mg/dL — ABNORMAL HIGH (ref 70–99)

## 2019-09-24 LAB — SURGICAL PCR SCREEN
MRSA, PCR: NEGATIVE
Staphylococcus aureus: POSITIVE — AB

## 2019-09-24 LAB — LACTIC ACID, PLASMA: Lactic Acid, Venous: 1.5 mmol/L (ref 0.5–1.9)

## 2019-09-24 MED ORDER — INSULIN ASPART 100 UNIT/ML ~~LOC~~ SOLN
0.0000 [IU] | Freq: Four times a day (QID) | SUBCUTANEOUS | Status: DC
  Administered 2019-09-24 – 2019-09-25 (×2): 2 [IU] via SUBCUTANEOUS
  Administered 2019-09-25: 3 [IU] via SUBCUTANEOUS
  Administered 2019-09-25 (×2): 2 [IU] via SUBCUTANEOUS
  Administered 2019-09-26 (×3): 3 [IU] via SUBCUTANEOUS
  Administered 2019-09-27 (×4): 2 [IU] via SUBCUTANEOUS
  Administered 2019-09-28: 1 [IU] via SUBCUTANEOUS
  Administered 2019-09-28 (×3): 2 [IU] via SUBCUTANEOUS
  Administered 2019-09-29: 1 [IU] via SUBCUTANEOUS
  Administered 2019-09-29: 2 [IU] via SUBCUTANEOUS
  Administered 2019-09-30 – 2019-10-01 (×5): 1 [IU] via SUBCUTANEOUS
  Administered 2019-10-01: 2 [IU] via SUBCUTANEOUS
  Administered 2019-10-01 (×3): 1 [IU] via SUBCUTANEOUS
  Administered 2019-10-02 (×2): 2 [IU] via SUBCUTANEOUS
  Administered 2019-10-02 – 2019-10-03 (×3): 1 [IU] via SUBCUTANEOUS
  Administered 2019-10-03 – 2019-10-04 (×5): 2 [IU] via SUBCUTANEOUS
  Administered 2019-10-04: 1 [IU] via SUBCUTANEOUS
  Administered 2019-10-05 (×2): 2 [IU] via SUBCUTANEOUS
  Administered 2019-10-05 – 2019-10-06 (×4): 1 [IU] via SUBCUTANEOUS
  Administered 2019-10-06: 2 [IU] via SUBCUTANEOUS
  Administered 2019-10-06 – 2019-10-07 (×3): 1 [IU] via SUBCUTANEOUS

## 2019-09-24 MED ORDER — KCL IN DEXTROSE-NACL 20-5-0.45 MEQ/L-%-% IV SOLN
INTRAVENOUS | Status: DC
  Filled 2019-09-24: qty 1000

## 2019-09-24 MED ORDER — SODIUM CHLORIDE 0.9% FLUSH
10.0000 mL | Freq: Two times a day (BID) | INTRAVENOUS | Status: DC
  Administered 2019-09-24: 10 mL
  Administered 2019-09-24: 6 mL
  Administered 2019-09-25 – 2019-10-05 (×9): 10 mL
  Administered 2019-10-06: 20 mL
  Administered 2019-10-06: 10 mL
  Administered 2019-10-07: 20 mL
  Administered 2019-10-08 – 2019-10-12 (×2): 10 mL

## 2019-09-24 MED ORDER — MUPIROCIN 2 % EX OINT
1.0000 "application " | TOPICAL_OINTMENT | Freq: Two times a day (BID) | CUTANEOUS | Status: AC
  Administered 2019-09-24 – 2019-09-28 (×10): 1 via NASAL
  Filled 2019-09-24 (×3): qty 22

## 2019-09-24 MED ORDER — SODIUM CHLORIDE 0.9% FLUSH
10.0000 mL | INTRAVENOUS | Status: DC | PRN
  Administered 2019-09-28 – 2019-10-04 (×2): 10 mL

## 2019-09-24 MED ORDER — TRAVASOL 10 % IV SOLN
INTRAVENOUS | Status: AC
  Filled 2019-09-24: qty 600

## 2019-09-24 MED ORDER — PIPERACILLIN-TAZOBACTAM 3.375 G IVPB
3.3750 g | Freq: Three times a day (TID) | INTRAVENOUS | Status: AC
  Administered 2019-09-24 – 2019-10-10 (×51): 3.375 g via INTRAVENOUS
  Filled 2019-09-24 (×49): qty 50

## 2019-09-24 MED ORDER — PANTOPRAZOLE SODIUM 40 MG IV SOLR
40.0000 mg | Freq: Two times a day (BID) | INTRAVENOUS | Status: DC
  Administered 2019-09-24 – 2019-10-12 (×37): 40 mg via INTRAVENOUS
  Filled 2019-09-24 (×37): qty 40

## 2019-09-24 MED ORDER — HYDROMORPHONE HCL 1 MG/ML IJ SOLN
1.0000 mg | INTRAMUSCULAR | Status: DC | PRN
  Administered 2019-09-25 – 2019-10-10 (×113): 1 mg via INTRAVENOUS
  Filled 2019-09-24 (×114): qty 1

## 2019-09-24 MED ORDER — KCL IN DEXTROSE-NACL 20-5-0.45 MEQ/L-%-% IV SOLN
INTRAVENOUS | Status: AC
  Filled 2019-09-24 (×2): qty 1000

## 2019-09-24 MED ORDER — CHLORHEXIDINE GLUCONATE CLOTH 2 % EX PADS
6.0000 | MEDICATED_PAD | Freq: Every day | CUTANEOUS | Status: AC
  Administered 2019-09-24 – 2019-09-28 (×4): 6 via TOPICAL

## 2019-09-24 NOTE — Progress Notes (Addendum)
NAME:  Brad Hughes, MRN:  [id number], DOB:  10-31-1975, LOS: 1 ADMISSION DATE:  09/22/2019, CONSULTATION DATE:  7/17 REFERRING MD:  [place], CHIEF COMPLAINT:  SIRS, peritonitis    Brief History   44 year old man remote smoker/ on cpap (?per Toma Copier?)  with history of obesity s/p lap roux-en-y gastric bypass 09/20/19 (performed in [place], discharged same day) presented 7/16 with abd pain, tachycardia, dehydration back to OR  pm 7/17 for Diagnostic laparoscopy with placement of drainage tubes and irrigation and debridement of intra-abdominal abscess and EGD and PCCM consulted post op ? Developing abd sepsis.     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 OSA on CPAP   Significant Hospital Events      Consults:  7/17 PCCM  Procedures:  7/17 OR >> Diagnostic laparoscopy with placement of drainage tubes and irrigation and debridement of intra-abdominal abscess and EGD  Significant Diagnostic Tests:  CTA chest/abd/pelvis 7/16 >> Limited study demonstrating no central, lobar or proximal segmental sized pulmonary embolism. The appearance of the dependent portions of the lungs suggest sequela of aspiration with a combination of atelectasis and airspace consolidation bilaterally in the lower lobes. 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 There is also a small volume of ascites. Severe hepatic steatosis. Mild colonic diverticulosis.  Micro Data:  covid 7/16>> neg BC x 2 7/16>>> MRSA PCR 7/17 >  POS     Antimicrobials:  Zosyn 7/17>>   Scheduled Meds: . acetaminophen  1,000 mg Oral Q6H  . Chlorhexidine Gluconate Cloth  6 each Topical Q0600  . Chlorhexidine Gluconate Cloth  6 each Topical Q0600  . docusate sodium  100  mg Oral BID  . enoxaparin (LOVENOX) injection  40 mg Subcutaneous Q24H  . lidocaine  1 patch Transdermal Q24H  . mouth rinse  15 mL Mouth Rinse BID  . mupirocin ointment  1 application Nasal BID  . pantoprazole (PROTONIX) IV  40 mg Intravenous Q12H   Continuous Infusions: . dextrose 5 % and 0.45 % NaCl with KCl 20 mEq/L 125 mL/hr at 09/24/19 0851  . piperacillin-tazobactam (ZOSYN)  IV     PRN Meds:.acetaminophen, diphenhydrAMINE **OR** diphenhydrAMINE, HYDROmorphone (DILAUDID) injection, HYDROmorphone (DILAUDID) injection, metoprolol tartrate, morphine injection, ondansetron **OR** ondansetron (ZOFRAN) IV, oxyCODONE  Interim history/subjective:  No sob/ cough/ cp   Objective   Blood pressure 126/60, pulse (!) 109, temperature 97.6 F (36.4 C), temperature source Oral, resp. rate (!) 30, height 5\' 10"  (1.778 m), weight 131.5 kg, SpO2 95 %.        Intake/Output Summary (Last 24 hours) at 09/24/2019 0852 Last data filed at 09/24/2019 0500 Gross per 24 hour  Intake 2400 ml  Output 815 ml  Net 1585 ml   Filed Weights   09/22/19 0627  Weight: 131.5 kg    Examination: Tmax 100.8   Pt alert, approp nad @ 30 degrees/ 02 at 3lpm with sats mid 90s and no increased wob  No jvd Oropharynx clear,  mucosa nl Neck supple Lungs clear bilaterally RRR no s3 or or sign murmur Abd mildly distended, limited excursion  Extr warm with no edema or clubbing noted/ PAS in place Neuro  Sensorium intact ,  no apparent motor deficits      Assessment & Plan:  44 year old man  with history of obesity s/p recent lap roux-en-y gastric bypass 7/14 (performed in [place], discharged same day) presented 7/16 with abd pain, tachycardia, dehydration >  s/p laparoscopy pm 7/17  and found to have GJ anastomotic leak and had placement of drainage tubes, irrigation and debridment.  Sepsis, Peritonitis - in setting anastomotic leak from rou-en-y gastric bypass now s/p repair, washout and drain placement 7/17 - lactic  acids serially nl so sepsis unlikely though may still have significant 3rd spcing  >>> Continue abx per primary/ no need to cover mrsa PCR with vanc IV  unless clinical deterioration  >>> Gentle volume with Goal MAP >65, consider levophed if MAP <65 >>> check cvp one central line placed to monitor vol status in setting of 3rd spacing    Acute hypoxemic resp failure, present on admit  ? Aspiration suggested by CT chest 6/16 vs ALI from abd sepsis  >>> rx zosyn approp, adjust fio2 to sats > 90% / IS ordered am 7/17 / up in chair as tol  OSA on CPAP >>> ok of cpap for now and monitor on 02   Hyperglycemia - mild --Monitor, SSI if persists      Best practice:  Diet: per surgery  Pain/Anxiety/Delirium protocol (if indicated): prn dilaudid VAP protocol (if indicated): n/a DVT prophylaxis: per ccs LOVENOX to start pm 7/18  GI prophylaxis: Protonix  Iv  Glucose control: monitor Mobility: OOB per surgery  Code Status: full Family Communication: Discussed with patient directly  Disposition: Step down  Labs   CBC: Recent Labs  Lab 09/22/19 0623 09/23/19 0557 09/23/19 1746 09/24/19 0054 09/24/19 0807  WBC 9.4 13.2* 10.4 12.3* 14.1*  NEUTROABS  --   --  9.2*  --   --   HGB 12.7* 12.0* 13.5 13.6 13.3  HCT 37.3* 34.7* 41.2 41.4 40.2  MCV 86.9 85.7 88.4 89.2 88.4  PLT 193 184 228 214 228    Basic Metabolic Panel: Recent Labs  Lab 09/22/19 0623 09/23/19 0557 09/23/19 1746 09/24/19 0054 09/24/19 0807  NA 138 133* 136 134* 137  K 3.3* 4.2 4.0 4.3 4.6  CL 104 100 101 101 101  CO2 22 25 21* 23 26  GLUCOSE 132* 136* 149* 152* 172*  BUN 16 15 17 20 20   CREATININE 0.94 0.94 1.30* 1.64* 1.44*  CALCIUM 8.3* 8.4* 8.7* 8.0* 8.2*  MG  --  2.0  --   --   --    GFR: Estimated Creatinine Clearance: 90.2 mL/min (A) (by C-G formula based on SCr of 1.44 mg/dL (H)). Recent Labs  Lab 09/22/19 0623 09/22/19 0631 09/22/19 0831 09/23/19 0557 09/23/19 1746 09/23/19 2211 09/24/19 0054  09/24/19 0807  WBC   < >  --   --  13.2* 10.4  --  12.3* 14.1*  LATICACIDVEN  --  1.1 1.3  --   --  1.5 1.5  --    < > = values in this interval not displayed.    Liver Function Tests: Recent Labs  Lab 09/22/19 0623 09/23/19 0557 09/23/19 1746 09/24/19 0054 09/24/19 0807  AST 24 18 13* 34 20  ALT 56* 40 34 44 39  ALKPHOS 110 93 93 81 90  BILITOT 3.8* 3.1* 3.5* 3.0* 2.9*  PROT 7.6 7.0 7.0 6.3* 6.7  ALBUMIN 3.5 2.9* 3.0* 2.6* 2.7*   Recent Labs  Lab 09/22/19 0623  LIPASE 31    Coagulation Profile: Recent Labs  Lab 09/22/19 0623  INR 1.1     The patient  is critically ill with multiple organ systems failure and requires high complexity decision making for assessment and support, frequent evaluation and titration of therapies, application of advanced monitoring technologies and extensive interpretation of multiple databases. Critical Care Time devoted to patient care services described in this note is 35 minutes.    Sandrea Hughs, MD Pulmonary and Critical Care Medicine [facility] Cell [phone]  7  After 7:00 pm call Elink  [phone]

## 2019-09-24 NOTE — Progress Notes (Signed)
Peripherally Inserted Central Catheter Placement  The IV Nurse has discussed with the patient and/or persons authorized to consent for the patient, the purpose of this procedure and the potential benefits and risks involved with this procedure.  The benefits include less needle sticks, lab draws from the catheter, and the patient may be discharged home with the catheter. Risks include, but not limited to, infection, bleeding, blood clot (thrombus formation), and puncture of an artery; nerve damage and irregular heartbeat and possibility to perform a PICC exchange if needed/ordered by physician.  Alternatives to this procedure were also discussed.  Bard Power PICC patient education guide, fact sheet on infection prevention and patient information card has been provided to patient /or left at bedside.    PICC Placement Documentation  PICC Double Lumen 09/24/19 PICC Right Cephalic 39 cm 0 cm (Active)  Indication for Insertion or Continuance of Line Administration of hyperosmolar/irritating solutions (i.e. TPN, Vancomycin, etc.) 09/24/19 1000  Exposed Catheter (cm) 0 cm 09/24/19 1000  Site Assessment Clean;Dry;Intact 09/24/19 1000  Lumen #1 Status Flushed;Saline locked;Blood return noted 09/24/19 1000  Lumen #2 Status Flushed;Saline locked;Blood return noted 09/24/19 1000  Dressing Type Transparent;Securing device 09/24/19 1000  Dressing Status Clean;Dry;Intact;Antimicrobial disc in place 09/24/19 1000  Dressing Change Due 10/01/19 09/24/19 1000       Franne Grip [place] 09/24/2019, 10:29 AM

## 2019-09-24 NOTE — Progress Notes (Signed)
PHARMACY - TOTAL PARENTERAL NUTRITION CONSULT NOTE   Indication: post-op bowel rest  Patient Measurements: Height: 5\' 10"  (177.8 cm) Weight: 131.5 kg (290 lb) IBW/kg (Calculated) : 73 TPN AdjBW (KG): 87.6 Body mass index is 41.61 kg/m. Usual Weight: 131.5kg Adj body weight: ~ 100 kg  Assessment:  44 yoM admit 7/16 with post-op leak at GJ anastomosis (surgery 7/12), Expl Lap 7/17, drains placed Peritonitis, on Zosyn 3.375gm q8  Glucose / Insulin: no hx DM, serum glucose elevated Electrolytes: wnl pre-TPN Renal: SCr sl elevated LFTs / TGs: LFTs wnl - but BUN elevated & decreasing,  Prealbumin / albumin: Albumin 2.7 (7/18) Intake / Output; MIVF: drain output 76ml (7/17), D5 & 1/2NS w/20 KCl at 125 ml/lhr GI Imaging: 7/16 Chest/Abd CT: no PE, possible aspiration/lungs, pneumoperitoneum/gas, ascites, severe hepatic steatosis, mild diverticulosis - no inflammation Surgeries / Procedures: 7/12 (?) Roux-en-Y bypass in [place], was d/c same day. 7/17 Expl Lap at [facility] - repair anastomosis  Central access: PICC ordered 7/18 TPN start date: dependent on PICC placement, TPN ordered for 7/18  Nutritional Goals (await RD recommendation): kCal: , Protein: , Fluid:  Goal TPN rate is  mL/hr (provides  g of protein and  kcals per day) Pharmacy estimated goals to start: 2500 kCal, 120-150 gm Protein per day  Current Nutrition:  NPO, aspiration precautions  Plan:  Start TPN at 50 mL/hr at 1800 Electrolytes in TPN: 41mEq/L of Na, 63mEq/L of K, 47mEq/L of Ca, 44mEq/L of Mg, and 56mmol/L of Phos   Cl:Ac ratio 1:1 Add standard MVI and trace elements to TPN Initiate Sensitive q6h SSI and adjust as needed  Reduce MIVF to 75 mL/hr at 1800 Monitor TPN labs on Mon/Thurs  12m PharmD 09/24/2019,9:12 AM

## 2019-09-24 NOTE — Progress Notes (Signed)
1 Day Post-Op   Subjective/Chief Complaint: None Patient awake alert.  Feels better.  His vital signs are improved.  His pain is well controlled.  His drainage is yellowish in nature from both of his JPs.  Overall, he is feeling better he states.   Objective: Vital signs in last 24 hours: Temp:  [97.6 F (36.4 C)-100.8 F (38.2 C)] 97.6 F (36.4 C) (07/18 0400) Pulse Rate:  [90-145] 116 (07/18 0600) Resp:  [13-26] 22 (07/18 0600) BP: (115-154)/(41-96) 126/60 (07/18 0600) SpO2:  [93 %-99 %] 94 % (07/18 0600) Last BM Date: 09/18/19  Intake/Output from previous day: 07/17 0701 - 07/18 0700 In: 2400 [I.V.:2300; IV Piggyback:100] Out: 815 [Urine:575; Drains:190; Blood:50] Intake/Output this shift: No intake/output data recorded.  General appearance: alert and cooperative Resp: clear to auscultation bilaterally Cardio: regular rate and rhythm, S1, S2 normal, no murmur, click, rub or gallop Incision/Wound: Port sites clean dry intact.  Abdomen is relatively soft and nontender.  JP drains #mild bilious drainage and serosanguineous drainage.  Lab Results:  Recent Labs    09/24/19 0054 09/24/19 0807  WBC 12.3* 14.1*  HGB 13.6 13.3  HCT 41.4 40.2  PLT 214 228   BMET Recent Labs    09/23/19 1746 09/24/19 0054  NA 136 134*  K 4.0 4.3  CL 101 101  CO2 21* 23  GLUCOSE 149* 152*  BUN 17 20  CREATININE 1.30* 1.64*  CALCIUM 8.7* 8.0*   PT/INR Recent Labs    09/22/19 0623  LABPROT 13.7  INR 1.1   ABG No results for input(s): PHART, HCO3 in the last 72 hours.  Invalid input(s): PCO2, PO2  Studies/Results: CT Angio Chest PE W/Cm &/Or Wo Cm  Result Date: 09/22/2019 CLINICAL DATA:  44 year old male with history of abdominal pain and fever. Status post gastric bypass procedure 2 days ago. Shortness of breath. EXAM: CT ANGIOGRAPHY CHEST CT ABDOMEN AND PELVIS WITH CONTRAST TECHNIQUE: Multidetector CT imaging of the chest was performed using the standard protocol during  bolus administration of intravenous contrast. Multiplanar CT image reconstructions and MIPs were obtained to evaluate the vascular anatomy. Multidetector CT imaging of the abdomen and pelvis was performed using the standard protocol during bolus administration of intravenous contrast. CONTRAST:  OMNIPAQUE IOHEXOL 350 MG/ML SOLN COMPARISON:  None. FINDINGS: CTA CHEST FINDINGS Comment: Poor quality examination secondary to a combination of suboptimal contrast bolus and extensive patient respiratory motion. Cardiovascular: No central, lobar or proximal segmental sized filling defects are noted to suggest pulmonary embolism. Distal segmental and subsegmental sized emboli cannot be entirely excluded secondary to extensive patient respiratory motion. Heart size is normal. There is no significant pericardial fluid, thickening or pericardial calcification. No atherosclerotic calcifications in the thoracic aorta or the coronary arteries. Mediastinum/Nodes: No pathologically enlarged mediastinal or hilar lymph nodes. Esophagus is unremarkable in appearance. No axillary lymphadenopathy. Lungs/Pleura: Dependent opacities and volume loss in the lower lobes of the lungs bilaterally, compatible with a combination of airspace consolidation and atelectasis, suspicious for sequela of recent aspiration. Trace left pleural effusion. Musculoskeletal: There are no aggressive appearing lytic or blastic lesions noted in the visualized portions of the skeleton. Review of the MIP images confirms the above findings. CT ABDOMEN and PELVIS FINDINGS Hepatobiliary: Diffuse low attenuation throughout the hepatic parenchyma, indicative of hepatic steatosis. No cystic or solid hepatic lesions. No intra or extrahepatic biliary ductal dilatation. Gallbladder is normal in appearance. Pancreas: No pancreatic mass. No pancreatic ductal dilatation. No pancreatic or peripancreatic fluid collections or inflammatory  changes. Spleen: Unremarkable.  Adrenals/Urinary Tract: Bilateral kidneys and adrenal glands are normal in appearance. No hydroureteronephrosis. Urinary bladder is normal in appearance. 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 Intramural gas along the anterior aspect of the gastric antrum best appreciated on axial image 29 of series 2. Small to moderate amount of pneumoperitoneum, particularly in the upper abdomen adjacent to the surgical site. Some mildly dilated loops of jejunum are noted, presumably mild postoperative ileus. No other pathologic dilatation of small bowel or colon. A few scattered colonic diverticuli are evident, without surrounding inflammatory changes to clearly indicate an acute diverticulitis at this time. Normal appendix. Vascular/Lymphatic: No significant atherosclerotic disease, aneurysm or dissection noted in the abdominal or pelvic vasculature. No lymphadenopathy noted in the abdomen or pelvis. Reproductive: Prostate gland and seminal vesicles are unremarkable in appearance. Other: Small to moderate amount of pneumoperitoneum, presumably related to recent surgery. Trace volume of ascites. Musculoskeletal: There are no aggressive appearing lytic or blastic lesions noted in the visualized portions of the skeleton. Review of the MIP images confirms the above findings. IMPRESSION: 1. Limited study demonstrating no central, lobar or proximal segmental sized pulmonary embolism. 2. The appearance of the dependent portions of the lungs suggest sequela of aspiration with a combination of atelectasis and airspace consolidation bilaterally in the lower lobes. 3. 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 There is also a small volume of ascites. 4. Severe hepatic steatosis. 5. Mild colonic diverticulosis without  evidence to suggest an acute diverticulitis at this time. Electronically Signed   By: Trudie Reed M.D.   On: 09/22/2019 10:44   CT Abdomen Pelvis W Contrast  Result Date: 09/22/2019 CLINICAL DATA:  44 year old male with history of abdominal pain and fever. Status post gastric bypass procedure 2 days ago. Shortness of breath. EXAM: CT ANGIOGRAPHY CHEST CT ABDOMEN AND PELVIS WITH CONTRAST TECHNIQUE: Multidetector CT imaging of the chest was performed using the standard protocol during bolus administration of intravenous contrast. Multiplanar CT image reconstructions and MIPs were obtained to evaluate the vascular anatomy. Multidetector CT imaging of the abdomen and pelvis was performed using the standard protocol during bolus administration of intravenous contrast. CONTRAST:  OMNIPAQUE IOHEXOL 350 MG/ML SOLN COMPARISON:  None. FINDINGS: CTA CHEST FINDINGS Comment: Poor quality examination secondary to a combination of suboptimal contrast bolus and extensive patient respiratory motion. Cardiovascular: No central, lobar or proximal segmental sized filling defects are noted to suggest pulmonary embolism. Distal segmental and subsegmental sized emboli cannot be entirely excluded secondary to extensive patient respiratory motion. Heart size is normal. There is no significant pericardial fluid, thickening or pericardial calcification. No atherosclerotic calcifications in the thoracic aorta or the coronary arteries. Mediastinum/Nodes: No pathologically enlarged mediastinal or hilar lymph nodes. Esophagus is unremarkable in appearance. No axillary lymphadenopathy. Lungs/Pleura: Dependent opacities and volume loss in the lower lobes of the lungs bilaterally, compatible with a combination of airspace consolidation and atelectasis, suspicious for sequela of recent aspiration. Trace left pleural effusion. Musculoskeletal: There are no aggressive appearing lytic or blastic lesions noted in the visualized portions of  the skeleton. Review of the MIP images confirms the above findings. CT ABDOMEN and PELVIS FINDINGS Hepatobiliary: Diffuse low attenuation throughout the hepatic parenchyma, indicative of hepatic steatosis. No cystic or solid hepatic lesions. No intra or extrahepatic biliary ductal dilatation. Gallbladder is normal in appearance.  Pancreas: No pancreatic mass. No pancreatic ductal dilatation. No pancreatic or peripancreatic fluid collections or inflammatory changes. Spleen: Unremarkable. Adrenals/Urinary Tract: Bilateral kidneys and adrenal glands are normal in appearance. No hydroureteronephrosis. Urinary bladder is normal in appearance. 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 Intramural gas along the anterior aspect of the gastric antrum best appreciated on axial image 29 of series 2. Small to moderate amount of pneumoperitoneum, particularly in the upper abdomen adjacent to the surgical site. Some mildly dilated loops of jejunum are noted, presumably mild postoperative ileus. No other pathologic dilatation of small bowel or colon. A few scattered colonic diverticuli are evident, without surrounding inflammatory changes to clearly indicate an acute diverticulitis at this time. Normal appendix. Vascular/Lymphatic: No significant atherosclerotic disease, aneurysm or dissection noted in the abdominal or pelvic vasculature. No lymphadenopathy noted in the abdomen or pelvis. Reproductive: Prostate gland and seminal vesicles are unremarkable in appearance. Other: Small to moderate amount of pneumoperitoneum, presumably related to recent surgery. Trace volume of ascites. Musculoskeletal: There are no aggressive appearing lytic or blastic lesions noted in the visualized portions of the skeleton. Review of the MIP images confirms the above findings. IMPRESSION: 1. Limited study demonstrating no central, lobar or proximal segmental sized pulmonary embolism. 2. The appearance of the dependent  portions of the lungs suggest sequela of aspiration with a combination of atelectasis and airspace consolidation bilaterally in the lower lobes. 3. 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 There is also a small volume of ascites. 4. Severe hepatic steatosis. 5. Mild colonic diverticulosis without evidence to suggest an acute diverticulitis at this time. Electronically Signed   By: Trudie Reed M.D.   On: 09/22/2019 10:44   [place] EKG SITE RITE  Result Date: 09/24/2019 If Site Rite image not attached, placement could not be confirmed due to current cardiac rhythm.   Anti-infectives: Anti-infectives (From admission, onward)   Start     Dose/Rate Route Frequency Ordered Stop   09/24/19 0730  piperacillin-tazobactam (ZOSYN) IVPB 3.375 g     Discontinue    Note to Pharmacy: Pharmacy to check dosing   3.375 g 12.5 mL/hr over 240 Minutes Intravenous Every 8 hours 09/24/19 0722     09/24/19 0600  cefoTEtan (CEFOTAN) 2 g in sodium chloride 0.9 % 100 mL IVPB        2 g 200 mL/hr over 30 Minutes Intravenous On call to O.R. 09/23/19 1735 09/23/19 1830   09/22/19 0915  piperacillin-tazobactam (ZOSYN) IVPB 3.375 g        3.375 g 100 mL/hr over 30 Minutes Intravenous  Once 09/22/19 0906 09/22/19 1001      Assessment/Plan: s/p Procedure(s): LAPAROSCOPY DIAGNOSTIC, EGD, Drainage of intra-abdominal abscess (N/A)   History of Roux-en-Y gastric bypass 6 days ago in [place] with postoperative leak at gastrojejunostomy anastomosis status post diagnostic laparoscopy and upper endoscopy and placement of drains-he looks remarkably well today.  Continue ICU care for today and Foley catheter for strict I's and O's for now.  Continue IV antibiotics.  He will require PICC line and TNA to help this heal and that will be  ordered for today or tomorrow.  He will need to be n.p.o. for now.  Acute renal failure-mild and improving with IV fluids.  Continue Foley catheter for today and discontinue tomorrow for strict I's and O's  FEN   PICC line PNA.  Continue IV fluids.  DVT prophylaxis-Lovenox and SCDs  ID-continue antibiotics follow CBC  LOS: 1 day    Clovis Puhomas A Phylis Javed 09/24/2019

## 2019-09-25 ENCOUNTER — Inpatient Hospital Stay (HOSPITAL_COMMUNITY): Payer: Medicaid Other

## 2019-09-25 DIAGNOSIS — J9601 Acute respiratory failure with hypoxia: Secondary | ICD-10-CM | POA: Diagnosis not present

## 2019-09-25 LAB — COMPREHENSIVE METABOLIC PANEL
ALT: 26 U/L (ref 0–44)
AST: 15 U/L (ref 15–41)
Albumin: 2.3 g/dL — ABNORMAL LOW (ref 3.5–5.0)
Alkaline Phosphatase: 83 U/L (ref 38–126)
Anion gap: 11 (ref 5–15)
BUN: 16 mg/dL (ref 6–20)
CO2: 25 mmol/L (ref 22–32)
Calcium: 7.7 mg/dL — ABNORMAL LOW (ref 8.9–10.3)
Chloride: 99 mmol/L (ref 98–111)
Creatinine, Ser: 1.04 mg/dL (ref 0.61–1.24)
GFR calc Af Amer: >60 mL/min (ref >60)
GFR calc non Af Amer: >60 mL/min (ref >60)
Glucose, Bld: 177 mg/dL — ABNORMAL HIGH (ref 70–99)
Potassium: 3.9 mmol/L (ref 3.5–5.1)
Sodium: 135 mmol/L (ref 135–145)
Total Bilirubin: 1.6 mg/dL — ABNORMAL HIGH (ref 0.3–1.2)
Total Protein: 6.1 g/dL — ABNORMAL LOW (ref 6.5–8.1)

## 2019-09-25 LAB — CBC
HCT: 36.8 % — ABNORMAL LOW (ref 39.0–52.0)
Hemoglobin: 11.9 g/dL — ABNORMAL LOW (ref 13.0–17.0)
MCH: 29 pg (ref 26.0–34.0)
MCHC: 32.3 g/dL (ref 30.0–36.0)
MCV: 89.8 fL (ref 80.0–100.0)
Platelets: 209 10*3/uL (ref 150–400)
RBC: 4.1 MIL/uL — ABNORMAL LOW (ref 4.22–5.81)
RDW: 13.3 % (ref 11.5–15.5)
WBC: 16.9 10*3/uL — ABNORMAL HIGH (ref 4.0–10.5)
nRBC: 0.2 % (ref 0.0–0.2)

## 2019-09-25 LAB — TRIGLYCERIDES: Triglycerides: 398 mg/dL — ABNORMAL HIGH (ref <150)

## 2019-09-25 LAB — DIFFERENTIAL
Abs Immature Granulocytes: 0.44 10*3/uL — ABNORMAL HIGH (ref 0.00–0.07)
Basophils Absolute: 0.1 10*3/uL (ref 0.0–0.1)
Basophils Relative: 1 %
Eosinophils Absolute: 0 10*3/uL (ref 0.0–0.5)
Eosinophils Relative: 0 %
Immature Granulocytes: 3 %
Lymphocytes Relative: 5 %
Lymphs Abs: 0.8 10*3/uL (ref 0.7–4.0)
Monocytes Absolute: 1.1 10*3/uL — ABNORMAL HIGH (ref 0.1–1.0)
Monocytes Relative: 7 %
Neutro Abs: 14.4 10*3/uL — ABNORMAL HIGH (ref 1.7–7.7)
Neutrophils Relative %: 84 %

## 2019-09-25 LAB — GLUCOSE, CAPILLARY
Glucose-Capillary: 176 mg/dL — ABNORMAL HIGH (ref 70–99)
Glucose-Capillary: 198 mg/dL — ABNORMAL HIGH (ref 70–99)
Glucose-Capillary: 218 mg/dL — ABNORMAL HIGH (ref 70–99)

## 2019-09-25 LAB — MAGNESIUM: Magnesium: 2.2 mg/dL (ref 1.7–2.4)

## 2019-09-25 LAB — PREALBUMIN: Prealbumin: <5 mg/dL — ABNORMAL LOW (ref 18–38)

## 2019-09-25 LAB — PHOSPHORUS: Phosphorus: 2.3 mg/dL — ABNORMAL LOW (ref 2.5–4.6)

## 2019-09-25 MED ORDER — POTASSIUM PHOSPHATES 15 MMOLE/5ML IV SOLN
15.0000 mmol | Freq: Once | INTRAVENOUS | Status: AC
  Administered 2019-09-25: 15 mmol via INTRAVENOUS
  Filled 2019-09-25: qty 5

## 2019-09-25 MED ORDER — TRAVASOL 10 % IV SOLN
INTRAVENOUS | Status: AC
  Filled 2019-09-25: qty 1200

## 2019-09-25 MED ORDER — HYDRALAZINE HCL 20 MG/ML IJ SOLN: 10.0000 mg | INTRAMUSCULAR | Status: DC | PRN

## 2019-09-25 MED ORDER — KCL IN DEXTROSE-NACL 20-5-0.45 MEQ/L-%-% IV SOLN
INTRAVENOUS | Status: DC
  Filled 2019-09-25 (×8): qty 1000

## 2019-09-25 NOTE — Progress Notes (Addendum)
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 Developing abd sepsis.  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 OSA on CPAP   Significant Hospital Events   7/16 Admit with abdominal pain  7/17 Laparoscopy with drainage tubes, irrigation / debridement of abscess + EGD  Consults:  7/17 PCCM  Procedures:     Significant Diagnostic Tests:  CTA chest/abd/pelvis 7/16 >> Limited study demonstrating no central, lobar or proximal segmental sized pulmonary embolism. The appearance of the dependent portions of the lungs suggest sequela of aspiration with a combination of atelectasis and airspace consolidation bilaterally in the lower lobes. 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 There is also a small volume of ascites. Severe hepatic steatosis. Mild colonic diverticulosis.  Micro Data:  Covid 7/16 >> neg BC x 2 7/16 >> MRSA PCR 7/17 >>  POS   Antimicrobials:  Zosyn 7/17 >>  Interim history/subjective:  Pt reports abdominal discomfort and thirst  VSS / Afebrile   Objective   Blood pressure 136/63, pulse (!) 114, temperature 98.2 F (36.8 C), temperature source Oral, resp. rate  (!) 25, height 5\' 10"  (1.778 m), weight 131.5 kg, SpO2 92 %. CVP:  [15 mmHg] 15 mmHg      Intake/Output Summary (Last 24 hours) at 09/25/2019 0849 Last data filed at 09/25/2019 09/27/2019 Gross per 24 hour  Intake 1480.04 ml  Output 1547 ml  Net -66.96 ml   Filed Weights   09/22/19 09/24/19  Weight: 131.5 kg    Examination: General: adult male sitting up in chair in NAD HEENT: MM pink/dry, no jvd, good dentition  Neuro: AAOx4, speech clear, MAE  CV: s1s2 rrr, no m/r/g PULM: non-labored, lungs bilaterally clear GI: soft, bsx4 hypoactive, drain x2 Extremities: warm/dry, no edema  Skin: no rashes or lesions  Assessment & Plan:   44 year old man with history of obesity s/p recent lap roux-en-y gastric bypass 7/14 (performed in 8/14, discharged same day) presented 7/16 with abd pain, tachycardia, dehydration >  s/p laparoscopy pm 7/17  and found to have GJ anastomotic leak and had placement of drainage tubes, irrigation and debridment.  Sepsis secondary to Peritonitis  Anastomotic leak from rou-en-y gastric bypass now s/p repair, washout and drain placement 7/17 -abx as above  -follow fever curve / WBC trend  -post-op, wound care per [facility] -MAP goal >65    Acute Hypoxemic Respiratory Failure, present on admit   Suspected aspiration suggested by CT chest 6/16 vs ALI from abd sepsis  -wean O2 for sats >90% -pulmonary hygiene - IS, mobilize -follow intermittent CXR   AKI Resolving, in setting of sepsis  -Trend BMP / urinary output -Replace electrolytes as indicated -Avoid nephrotoxic agents, ensure adequate renal perfusion  At Risk Malnutrition  -TPN  per [facility] / Pharmacy   OSA on CPAP -CPAP QHS & PRN daytime sleep   Hyperglycemia - mild -monitor -CBG goal 140-180   Best practice:  Diet: per surgery  Pain/Anxiety/Delirium protocol (if indicated): prn dilaudid VAP protocol (if indicated): n/a DVT prophylaxis: per [facility] / lovenox  GI prophylaxis: Protonix   Glucose control:  monitor Mobility: OOB per surgery  Code Status: full code  Family Communication: Patient and wife update on plan of care Disposition: Step down, per [facility]  PCCM will sign off.  Please call back if new needs arise.   Labs   CBC: Recent Labs  Lab 09/23/19 0557 09/23/19 1746 09/24/19 0054 09/24/19 0807 09/25/19 0230  WBC 13.2* 10.4 12.3* 14.1* 16.9*  NEUTROABS  --  9.2*  --   --  14.4*  HGB 12.0* 13.5 13.6 13.3 11.9*  HCT 34.7* 41.2 41.4 40.2 36.8*  MCV 85.7 88.4 89.2 88.4 89.8  PLT 184 228 214 228 209    Basic Metabolic Panel: Recent Labs  Lab 09/23/19 0557 09/23/19 1746 09/24/19 0054 09/24/19 0807 09/25/19 0230  NA 133* 136 134* 137 135  K 4.2 4.0 4.3 4.6 3.9  CL 100 101 101 101 99  CO2 25 21* 23 26 25   GLUCOSE 136* 149* 152* 172* 177*  BUN 15 17 20 20 16   CREATININE 0.94 1.30* 1.64* 1.44* 1.04  CALCIUM 8.4* 8.7* 8.0* 8.2* 7.7*  MG 2.0  --   --   --  2.2  PHOS  --   --   --   --  2.3*   GFR: Estimated Creatinine Clearance: 124.9 mL/min (by C-G formula based on SCr of 1.04 mg/dL). Recent Labs  Lab 09/22/19 0631 09/22/19 0831 09/23/19 0557 09/23/19 1746 09/23/19 2211 09/24/19 0054 09/24/19 0807 09/25/19 0230  WBC  --   --    < > 10.4  --  12.3* 14.1* 16.9*  LATICACIDVEN 1.1 1.3  --   --  1.5 1.5  --   --    < > = values in this interval not displayed.    Liver Function Tests: Recent Labs  Lab 09/23/19 0557 09/23/19 1746 09/24/19 0054 09/24/19 0807 09/25/19 0230  AST 18 13* 34 20 15  ALT 40 34 44 39 26  ALKPHOS 93 93 81 90 83  BILITOT 3.1* 3.5* 3.0* 2.9* 1.6*  PROT 7.0 7.0 6.3* 6.7 6.1*  ALBUMIN 2.9* 3.0* 2.6* 2.7* 2.3*   Recent Labs  Lab 09/22/19 0623  LIPASE 31    Coagulation Profile: Recent Labs  Lab 09/22/19 0623  INR 1.1     CC Time: n/a  09/24/19, MSN, NP-C [facility] 09/25/2019, 8:49 AM   Please see [email or web address] for pager details.

## 2019-09-25 NOTE — Progress Notes (Signed)
Initial Nutrition Assessment  DOCUMENTATION CODES:   Morbid obesity  INTERVENTION:  - TPN advancement per Pharmacist.   NUTRITION DIAGNOSIS:   Inadequate oral intake related to inability to eat as evidenced by NPO status.  GOAL:   Patient will meet greater than or equal to 90% of their needs  MONITOR:   Diet advancement, Labs, Weight trends, Other (Comment) (TPN regimen)  REASON FOR ASSESSMENT:   Consult New TPN/TNA  ASSESSMENT:   44 year-old male with medical hx of obesity, HLD, anal fistula. He underwent Roux-en-Y gastric bypass in [place] on 7/14 and presented to the [facility] ED on 7/16 due to uncontrolled abdominal pain. He drove back from [place] on 7/15.  Patient sleeping throughout visit, including during brief NFPE. Patient's wife at bedside. Talked with her about TPN. She states that since surgery patient has only had water and 1 cup of jello. Wife emotional about current situation. Unable to obtain any further nutrition-related information at this time.   Weight on 7/16 was 290 lb and PTA the most recently documented weight was on 02/05/16 when he weighed 263 lb. Will monitor weight trends closely.   Able to talk with Pharmacist about patient. Double lumen PICC was placed in R cephalic yesterday and custom TPN started at 50 ml/hr yesterday evening and currently infusing.  Per notes: - POD #2 laparoscopy with placement of drainage tubes and I&D of intra-abdominal abscess and EGD - persistent leak at GJ site--Surgery note states it will be difficult to repair so conservative management being done    Labs reviewed; Ca: 7.7 mg/dl, Phos: 2.3 mg/dl. Medications reviewed; sliding scale novolog, 40 mg IV protonix BID, 15 mmol IV KCl x1 run 7/19. IVF; D5-1/2 NS-20 mEq IV KCl @ 75 ml/hr (306 kcal).    NUTRITION - FOCUSED PHYSICAL EXAM:  completed; no muscle or fat wasting.   Diet Order:   Diet Order            Diet NPO time specified  Diet effective now                  EDUCATION NEEDS:   Not appropriate for education at this time  Skin:  Skin Assessment: Reviewed RN Assessment  Last BM:  PTA/unknown  Height:   Ht Readings from Last 1 Encounters:  09/22/19 5\' 10"  (1.778 m)    Weight:   Wt Readings from Last 1 Encounters:  09/22/19 131.5 kg     Estimated Nutritional Needs:  Kcal:  2300-2500 kcal Protein:  115-130 grams Fluid:  >/= 2.5 L/day     09/24/19, MS, RD, LDN, CNSC Inpatient Clinical Dietitian RD pager # available in AMION  After hours/weekend pager # available in [facility]

## 2019-09-25 NOTE — Progress Notes (Addendum)
2 Days Post-Op  Subjective: Feels a little better overall.  Pain seems fairly well controlled.  No flatus or Bm.  No nausea.  Up in chair  ROS: See above, otherwise other systems negative  Objective: Vital signs in last 24 hours: Temp:  [97.8 F (36.6 C)-100.6 F (38.1 C)] 100.6 F (38.1 C) (07/19 0800) Pulse Rate:  [111-119] 114 (07/19 0800) Resp:  [18-37] 35 (07/19 0800) BP: (117-163)/(48-103) 136/63 (07/19 0700) SpO2:  [91 %-98 %] 97 % (07/19 0800) Last BM Date: 09/18/19  Intake/Output from previous day: 07/18 0701 - 07/19 0700 In: 1532.1 [I.V.:1482.1; IV Piggyback:50] Out: 1547 [Urine:1500; Drains:47] Intake/Output this shift: Total I/O In: 1889.7 [I.V.:1720.2; IV Piggyback:169.4] Out: -   PE: Gen: NAD Heart: tachy, 120s Lungs: CTAB Abd: incisions are c/d/i, 2 JP drains present with bilious output, few BS, seems bloated, appropriately tender  Lab Results:  Recent Labs    09/24/19 0807 09/25/19 0230  WBC 14.1* 16.9*  HGB 13.3 11.9*  HCT 40.2 36.8*  PLT 228 209   BMET Recent Labs    09/24/19 0807 09/25/19 0230  NA 137 135  K 4.6 3.9  CL 101 99  CO2 26 25  GLUCOSE 172* 177*  BUN 20 16  CREATININE 1.44* 1.04  CALCIUM 8.2* 7.7*   PT/INR No results for input(s): LABPROT, INR in the last 72 hours. CMP     Component Value Date/Time   NA 135 09/25/2019 0230   K 3.9 09/25/2019 0230   CL 99 09/25/2019 0230   CO2 25 09/25/2019 0230   GLUCOSE 177 (H) 09/25/2019 0230   BUN 16 09/25/2019 0230   CREATININE 1.04 09/25/2019 0230   CALCIUM 7.7 (L) 09/25/2019 0230   PROT 6.1 (L) 09/25/2019 0230   ALBUMIN 2.3 (L) 09/25/2019 0230   AST 15 09/25/2019 0230   ALT 26 09/25/2019 0230   ALKPHOS 83 09/25/2019 0230   BILITOT 1.6 (H) 09/25/2019 0230   GFRNONAA >60 09/25/2019 0230   GFRAA >60 09/25/2019 0230   Lipase     Component Value Date/Time   LIPASE 31 09/22/2019 0623       Studies/Results: DG Chest Port 1 View  Result Date:  09/25/2019 CLINICAL DATA:  Acute respiratory failure with hypoxemia. EXAM: PORTABLE CHEST 1 VIEW COMPARISON:  09/22/2019 FINDINGS: Stable mild cardiac enlargement. Very low lung volumes with vascular crowding and atelectasis. Progressive central vascular congestion without overt pulmonary edema. No definite pleural effusions. Right PICC line tip in good position in the distal SVC. IMPRESSION: 1. Very low lung volumes with vascular crowding and atelectasis. 2. Progressive central vascular congestion without overt pulmonary edema. Electronically Signed   By: Rudie Meyer M.D.   On: 09/25/2019 06:43   [place] EKG SITE RITE  Result Date: 09/24/2019 If Site Rite image not attached, placement could not be confirmed due to current cardiac rhythm.   Anti-infectives: Anti-infectives (From admission, onward)   Start     Dose/Rate Route Frequency Ordered Stop   09/24/19 0730  piperacillin-tazobactam (ZOSYN) IVPB 3.375 g     Discontinue    Note to Pharmacy: Pharmacy to check dosing   3.375 g 12.5 mL/hr over 240 Minutes Intravenous Every 8 hours 09/24/19 0722     09/24/19 0600  cefoTEtan (CEFOTAN) 2 g in sodium chloride 0.9 % 100 mL IVPB        2 g 200 mL/hr over 30 Minutes Intravenous On call to O.R. 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 Laureen Ochs  -persistent leak at GJ site.  Leak appeared to be about 55mm in size with some necrotic and edematous/inflammatory changes noted around this site.  -cont JP drains for now to control leak  -cont strict NPO/TNA for artificial nutrition since g-tube not placed  -at this point, the leak is going to be  difficult to repair and so therefore, conservative management with NPO/drains is likely going to be the treatment pending he does not clinically decline  -mobilize  -pulm toilet/IS -cont zosyn -tachycardia likely secondary to low grade sepsis from leak.  Cont with IVFs and lopressor as needed for this and HTN. -remain in a SDU for now -nothing by mouth!  FEN - NPO/IVFs/TNA VTE - lovenox ID - zosyn   LOS: 2 days    Letha Cape , [facility] Surgery 09/25/2019, 9:38 AM Please see Amion for pager number during day hours 7:00am-4:30pm or 7:00am -11:30am on weekends  Agree with above. His wife Harriett Sine was at the bedside. He has two drains - both with bile stained fluid - so he has a continued leak.  His operating surgeon was Dr. Louann Sjogren in [place] [phone].  I have a call into him to get more details of his surgery and to update him.  Ovidio Kin, MD, [facility] Surgery Office phone:  [phone]

## 2019-09-25 NOTE — TOC Initial Note (Signed)
Transition of Care [facility]) - Initial/Assessment Note    Patient Details  Name: Brad Hughes MRN: [id number] Date of Birth: [birth year]  Transition of Care [facility]) CM/SW Contact:    Golda Acre, RN Phone Number: 09/25/2019, 10:41 AM  Clinical Narrative:                 Pt with intraabdominal abscess.wcb 16.9, from home has pcp. Plan is to return home in 2-3 days.  Expected Discharge Plan: Home/Self Care Barriers to Discharge: Continued Medical Work up   Patient Goals and CMS Choice Patient states their goals for this hospitalization and ongoing recovery are:: to go home CMS Medicare.gov Compare Post Acute Care list provided to:: Patient    Expected Discharge Plan and Services Expected Discharge Plan: Home/Self Care   Discharge Planning Services: CM Consult   Living arrangements for the past 2 months: Single Family Home                                      Prior Living Arrangements/Services Living arrangements for the past 2 months: Single Family Home Lives with:: Spouse Patient language and need for interpreter reviewed:: Yes Do you feel safe going back to the place where you live?: Yes      Need for Family Participation in Patient Care: Yes (Comment) Care giver support system in place?: Yes (comment)   Criminal Activity/Legal Involvement Pertinent to Current Situation/Hospitalization: No - Comment as needed  Activities of Daily Living Home Assistive Devices/Equipment: BIPAP, CBG Meter ADL Screening (condition at time of admission) Patient's cognitive ability adequate to safely complete daily activities?: Yes Is the patient deaf or have difficulty hearing?: No Does the patient have difficulty seeing, even when wearing glasses/contacts?: No Does the patient have difficulty concentrating, remembering, or making decisions?: No Patient able to express need for assistance with ADLs?: Yes Does the patient have difficulty dressing or bathing?:  No Independently performs ADLs?: Yes (appropriate for developmental age) Does the patient have difficulty walking or climbing stairs?: Yes (secondary to shortness of breath) Weakness of Legs: None Weakness of Arms/Hands: None  Permission Sought/Granted                  Emotional Assessment Appearance:: Appears stated age     Orientation: : Oriented to Self, Oriented to Place, Oriented to  Time, Oriented to Situation Alcohol / Substance Use: Not Applicable Psych Involvement: No (comment)  Admission diagnosis:  Post-operative state [Z98.890] Dehydration [E86.0] Patient Active Problem List   Diagnosis Date Noted  . Acute hypoxemic respiratory failure (HCC) 09/24/2019  . Pulmonary infiltrates 09/24/2019  . Sepsis following intra-abdominal surgery (HCC) 09/24/2019  . Dehydration 09/23/2019  . Post-operative state 09/22/2019   PCP:  Patient, No Pcp Per Pharmacy:   [facility] [address] ([address]),  - [address] [place] ([place]) [place] [address] Phone: [phone] Fax: [phone]     Social Determinants of Health (SDOH) Interventions    Readmission Risk Interventions No flowsheet data found.

## 2019-09-25 NOTE — Progress Notes (Signed)
PHARMACY - TOTAL PARENTERAL NUTRITION CONSULT NOTE   Indication: post-op bowel rest  Patient Measurements: Height: 5\' 10"  (177.8 cm) Weight: 131.5 kg (290 lb) IBW/kg (Calculated) : 73 TPN AdjBW (KG): 87.6 Body mass index is 41.61 kg/m. Usual Weight: 131.5kg Adj body weight: ~ 100 kg  Assessment:  44 yoM admit 7/16 with post-op leak at GJ anastomosis (surgery 7/12), Expl Lap 7/17, drains placed Peritonitis, on Zosyn 3.375gm q8  Glucose / Insulin: no hx DM, CBGs 121-177, SSI/24h 4 units (received dexamethasone 8 mg 7/17) Electrolytes: Lytes WNL except phos 2.3, CorrCa 9 Renal: SCr down to 1.04, UOP 1500 LFTs / TGs: LFTs wnl, T bili down to 1.6/ TG 398  Prealbumin / albumin: Albumin 2.3  Intake / Output; MIVF: drain output 32m, D5 & 1/2NS w/20 KCl at 75 ml/lhr GI Imaging: 7/16 Chest/Abd CT: no PE, possible aspiration/lungs, pneumoperitoneum/gas, ascites, severe hepatic steatosis, mild diverticulosis - no inflammation Surgeries / Procedures: 7/14 (?) Roux-en-Y bypass in [place], was d/c same day. 7/17 Expl Lap at [facility] - repair anastomosis  Central access: PICC ordered 7/18 TPN start date: dependent on PICC placement, TPN ordered for 7/18  Nutritional Goals (await RD recommendation): KCal: 2300-2500 , Protein: 115-130, Fluid: >/= 2.5 L/day Goal TPN rate is 100 mL/hr (provides  g of protein and 2388 kcals per day with lipids, 1908 kcals per day w/o lipids)  Current Nutrition:  NPO, aspiration precautions  Plan:  Increase TPN to goal of 100 ml/hr without lipids  Electrolytes in TPN: 35mEq/L of Na, 39mEq/L of K, 27mEq/L of Ca, 79mEq/L of Mg, and 32mmol/L of Phos   Cl:Ac ratio 1:1 Add standard MVI and trace elements to TPN Initiate Sensitive q6h SSI and adjust as needed  Reduce MIVF to 25 mL/hr at 1800 Monitor TPN labs on Mon/Thurs CMET, Mg, Phos, repeat TG in AM      12m D PharmD 09/25/2019,7:17 AM

## 2019-09-26 LAB — COMPREHENSIVE METABOLIC PANEL
ALT: 26 U/L (ref 0–44)
AST: 17 U/L (ref 15–41)
Albumin: 2.3 g/dL — ABNORMAL LOW (ref 3.5–5.0)
Alkaline Phosphatase: 119 U/L (ref 38–126)
Anion gap: 11 (ref 5–15)
BUN: 17 mg/dL (ref 6–20)
CO2: 28 mmol/L (ref 22–32)
Calcium: 8.1 mg/dL — ABNORMAL LOW (ref 8.9–10.3)
Chloride: 97 mmol/L — ABNORMAL LOW (ref 98–111)
Creatinine, Ser: 0.87 mg/dL (ref 0.61–1.24)
GFR calc Af Amer: >60 mL/min (ref >60)
GFR calc non Af Amer: >60 mL/min (ref >60)
Glucose, Bld: 239 mg/dL — ABNORMAL HIGH (ref 70–99)
Potassium: 4 mmol/L (ref 3.5–5.1)
Sodium: 136 mmol/L (ref 135–145)
Total Bilirubin: 1.2 mg/dL (ref 0.3–1.2)
Total Protein: 6.3 g/dL — ABNORMAL LOW (ref 6.5–8.1)

## 2019-09-26 LAB — CBC
HCT: 36.4 % — ABNORMAL LOW (ref 39.0–52.0)
Hemoglobin: 11.8 g/dL — ABNORMAL LOW (ref 13.0–17.0)
MCH: 29.1 pg (ref 26.0–34.0)
MCHC: 32.4 g/dL (ref 30.0–36.0)
MCV: 89.7 fL (ref 80.0–100.0)
Platelets: 246 10*3/uL (ref 150–400)
RBC: 4.06 MIL/uL — ABNORMAL LOW (ref 4.22–5.81)
RDW: 13.6 % (ref 11.5–15.5)
WBC: 21.4 10*3/uL — ABNORMAL HIGH (ref 4.0–10.5)
nRBC: 0.1 % (ref 0.0–0.2)

## 2019-09-26 LAB — GLUCOSE, CAPILLARY
Glucose-Capillary: 234 mg/dL — ABNORMAL HIGH (ref 70–99)
Glucose-Capillary: 205 mg/dL — ABNORMAL HIGH (ref 70–99)
Glucose-Capillary: 214 mg/dL — ABNORMAL HIGH (ref 70–99)

## 2019-09-26 LAB — TRIGLYCERIDES: Triglycerides: 443 mg/dL — ABNORMAL HIGH (ref <150)

## 2019-09-26 LAB — MAGNESIUM: Magnesium: 2.3 mg/dL (ref 1.7–2.4)

## 2019-09-26 LAB — PHOSPHORUS: Phosphorus: 3 mg/dL (ref 2.5–4.6)

## 2019-09-26 MED ORDER — INSULIN GLARGINE 100 UNIT/ML ~~LOC~~ SOLN
15.0000 [IU] | Freq: Once | SUBCUTANEOUS | Status: AC
  Administered 2019-09-26: 15 [IU] via SUBCUTANEOUS
  Filled 2019-09-26 (×2): qty 0.15

## 2019-09-26 MED ORDER — TRAVASOL 10 % IV SOLN
INTRAVENOUS | Status: AC
  Filled 2019-09-26: qty 1200

## 2019-09-26 MED ORDER — SODIUM CHLORIDE 0.9 % IV SOLN
200.0000 mg | Freq: Once | INTRAVENOUS | Status: AC
  Administered 2019-09-26: 200 mg via INTRAVENOUS
  Filled 2019-09-26: qty 200

## 2019-09-26 MED ORDER — SODIUM CHLORIDE 0.9 % IV SOLN
100.0000 mg | INTRAVENOUS | Status: DC
  Administered 2019-09-27 – 2019-10-04 (×8): 100 mg via INTRAVENOUS
  Filled 2019-09-26 (×8): qty 100

## 2019-09-26 NOTE — Evaluation (Signed)
Physical Therapy Evaluation Patient Details Name: Brad Hughes MRN: [id number] DOB: 23-Sep-1975 Today's Date: 09/26/2019   History of Present Illness  44 YO male ,s/p lap roux-en-y bypass  7/13.Admitted 09/22/19 with fever, abdominal pain.Dx lap with drainage of intra-abdominal abscess, identification of leak via EGD, and placement of 2 jp drains, Dr. Laureen Ochs - 7/17  Clinical Impression  The patient is progressing well , On 4 L [place], SPO2 94% when returned from 400' total walk. Patient should progress to DC home, hopefully without AD. Pt admitted with above diagnosis.  Pt currently with functional limitations due to the deficits listed below (see PT Problem List). Pt will benefit from skilled PT to increase their independence and safety with mobility to allow discharge to the venue listed below.  '     Follow Up Recommendations No PT follow up    Equipment Recommendations  None recommended by PT    Recommendations for Other Services       Precautions / Restrictions Precautions Precaution Comments: abd drain,      Mobility  Bed Mobility Overal bed mobility: Modified Independent             General bed mobility comments: HOB raised, used rails  Transfers Overall transfer level: Needs assistance Equipment used: Rolling walker (2 wheeled) Transfers: Sit to/from Stand Sit to Stand: Min guard         General transfer comment: assist with multiple lines  Ambulation/Gait Ambulation/Gait assistance: Min guard Gait Distance (Feet): 200 Feet (x2) Assistive device: Rolling walker (2 wheeled) Gait Pattern/deviations: Step-through pattern Gait velocity: decr   General Gait Details: decr, no balance loss, HR 110 on 4 L, - SPO2 not picking up whil;e ambulating  Stairs            Wheelchair Mobility    Modified Rankin (Stroke Patients Only)       Balance Overall balance assessment: No apparent balance deficits (not formally assessed)                                            Pertinent Vitals/Pain      Home Living Family/patient expects to be discharged to:: Private residence Living Arrangements: Spouse/significant other Available Help at Discharge: Family Type of Home: House Home Access: Stairs to enter   Secretary/administrator of Steps: 1 Home Layout: Bed/bath upstairs;1/2 bath on main level Home Equipment: None      Prior Function Level of Independence: Independent               Hand Dominance        Extremity/Trunk Assessment   Upper Extremity Assessment Upper Extremity Assessment: Overall WFL for tasks assessed    Lower Extremity Assessment Lower Extremity Assessment: Generalized weakness    Cervical / Trunk Assessment Cervical / Trunk Assessment: Normal  Communication      Cognition Arousal/Alertness: Awake/alert Behavior During Therapy: WFL for tasks assessed/performed Overall Cognitive Status: Within Functional Limits for tasks assessed                                        General Comments      Exercises     Assessment/Plan    PT Assessment Patient needs continued PT services  PT Problem List Decreased strength;Decreased mobility;Decreased knowledge of precautions;Decreased activity  tolerance;Decreased knowledge of use of DME       PT Treatment Interventions DME instruction;Therapeutic activities;Gait training;Therapeutic exercise;Patient/family education;Functional mobility training    PT Goals (Current goals can be found in the Care Plan section)  Acute Rehab PT Goals Patient Stated Goal: walk PT Goal Formulation: With patient Time For Goal Achievement: 10/10/19 Potential to Achieve Goals: Good    Frequency Min 3X/week   Barriers to discharge        Co-evaluation               AM-PAC PT "6 Clicks" Mobility  Outcome Measure Help needed turning from your back to your side while in a flat bed without using bedrails?: A Little Help  needed moving from lying on your back to sitting on the side of a flat bed without using bedrails?: A Little Help needed moving to and from a bed to a chair (including a wheelchair)?: A Little Help needed standing up from a chair using your arms (e.g., wheelchair or bedside chair)?: A Little Help needed to walk in hospital room?: A Little Help needed climbing 3-5 steps with a railing? : A Little 6 Click Score: 18    End of Session Equipment Utilized During Treatment: Oxygen Activity Tolerance: Patient tolerated treatment well Patient left: in chair;with call bell/phone within reach;with nursing/sitter in room Nurse Communication: Mobility status PT Visit Diagnosis: Difficulty in walking, not elsewhere classified (R26.2)    Time: 1610-9604 PT Time Calculation (min) (ACUTE ONLY): 33 min   Charges:   PT Evaluation $PT Eval Low Complexity: 1 Low PT Treatments $Gait Training: 8-22 mins        Blanchard Kelch PT Acute Rehabilitation Services Pager [phone] Office [phone]   Rada Hay 09/26/2019, 12:32 PM

## 2019-09-26 NOTE — Progress Notes (Addendum)
PHARMACY - TOTAL PARENTERAL NUTRITION CONSULT NOTE   Indication: post-op bowel rest  Patient Measurements: Height: 5\' 10"  (177.8 cm) Weight: 131.5 kg (290 lb) IBW/kg (Calculated) : 73 TPN AdjBW (KG): 87.6 Body mass index is 41.61 kg/m. Usual Weight: 131.5kg Adj body weight: ~ 100 kg  Assessment:  44 yoM admit 7/16 with post-op leak at GJ anastomosis (surgery 7/12), Expl Lap 7/17, drains placed Peritonitis, on Zosyn 3.375gm q8h, 7/20 Eraxis added   Glucose / Insulin: no hx DM, CBGs 176-234, SSI/24h 10 units (received dexamethasone 8 mg 7/17) Electrolytes: Lytes WNL, CorrCa 9.5 Renal: SCr now WNL, UOP 1425 ml LFTs / TGs: LFTs wnl, T bili down to 1.6 >> 1.2  TG 398 >> 443  Prealbumin / albumin: Albumin 2.3  Intake / Output; MIVF: drain output 60 mL, D5 & 1/2NS w/20 KCl at 25 ml/lhr GI Imaging: 7/16 Chest/Abd CT: no PE, possible aspiration/lungs, pneumoperitoneum/gas, ascites, severe hepatic steatosis, mild diverticulosis - no inflammation Surgeries / Procedures: 7/14 (?) Roux-en-[place] bypass in [place], was d/c same day. 7/17 Expl Lap at [facility] - repair anastomosis  Central access: PICC ordered 7/18 TPN start date: dependent on PICC placement, TPN ordered for 7/18  Nutritional Goals ( RD recommendations on 7/19 ): KCal: 2300-2500 , Protein: 115-130, Fluid: >/= 2.5 L/day Goal TPN rate is 100 mL/hr (provides  g of protein and 2388 kcals per day with lipids, 1908 kcals per day w/o lipids)  Current Nutrition:  NPO, aspiration precautions  Plan:   Continue TPN at goal rate  of 100 ml/hr without lipids as TG trending up   Electrolytes in TPN: 78mEq/L of Na, 30mEq/L of K, 33mEq/L of Ca, 48mEq/L of Mg, and 60mmol/L of Phos    Cl:Ac ratio 1:1  Standard MVI and trace elements to TPN  Continue  Sensitive q6h SSI and adjust as needed   Continue MIVF to 25 mL/hr    Monitor TPN labs on Mon/Thurs  CMET, Mg, Phos, repeat TG in AM        12m, PharmD, BCPS 09/26/2019 9:48 AM      ADDENDUM - Will order Lantus 15 units x1 as per DM coordinator recommendations.    09/28/2019, PharmD, BCPS 09/26/2019 12:06 PM

## 2019-09-26 NOTE — Progress Notes (Signed)
Pharmacy Antibiotic Note  Brad Hughes is a 44 y.o. male admitted on 09/22/2019 with peritonitis.  Pharmacy has been consulted for Eraxis dosing.  Plan: Eraxis 200 mg IVx1, then 100 mg IV daily   Height: 5\' 10"  (177.8 cm) Weight: 131.5 kg (290 lb) IBW/kg (Calculated) : 73  Temp (24hrs), Avg:98.2 F (36.8 C), Min:97.9 F (36.6 C), Max:98.8 F (37.1 C)  Recent Labs  Lab 09/22/19 0631 09/22/19 0831 09/23/19 0557 09/23/19 1746 09/23/19 2211 09/24/19 0054 09/24/19 0807 09/25/19 0230 09/26/19 0649  WBC  --   --    < > 10.4  --  12.3* 14.1* 16.9* 21.4*  CREATININE  --   --    < > 1.30*  --  1.64* 1.44* 1.04 0.87  LATICACIDVEN 1.1 1.3  --   --  1.5 1.5  --   --   --    < > = values in this interval not displayed.    Estimated Creatinine Clearance: 149.3 mL/min (by C-G formula based on SCr of 0.87 mg/dL).    No Known Allergies  Antimicrobials this admission: 7/18 Zosyn EI >> 7/20 Eraxis >>    Dose adjustments this admission:    Microbiology results: 7/16 BCx: ngtd 7/17 MRSA PCR: negative   Thank you for allowing pharmacy to be a part of this patient's care.  8/17, PharmD, BCPS 09/26/2019 8:22 AM

## 2019-09-26 NOTE — Progress Notes (Addendum)
Patient ID: Brad Hughes, male   DOB: [birth year], 44 y.o.   MRN: [id number]    3 Days Post-Op  Subjective: States he continues to feels a little better each day.  No nausea, some belching.  No flatus currently or BMs.  RN states he is very weak in trying to mobilize.  Pain seems controlled.  ROS: See above, otherwise other systems negative  Objective: Vital signs in last 24 hours: Temp:  [97.9 F (36.6 C)-98.8 F (37.1 C)] 97.9 F (36.6 C) (07/20 0700) Pulse Rate:  [97-125] 97 (07/20 0800) Resp:  [18-28] 25 (07/20 0800) BP: (118-159)/(51-96) 151/73 (07/20 0800) SpO2:  [92 %-96 %] 96 % (07/20 0800) Last BM Date: 09/18/19  Intake/Output from previous day: 07/19 0701 - 07/20 0700 In: 4823.9 [P.O.:240; I.V.:4087; IV Piggyback:496.9] Out: 1485 [Urine:1425; Drains:60] Intake/Output this shift: Total I/O In: 32.1 [IV Piggyback:32.1] Out: -   PE: Gen: NAD, laying flat in bed.  Pulls between [phone] on IS Heart: regular, not tachy today Lungs: CTAB Abd: soft, but distended, hypoactive BS, incisions c/d/i, both JP drains just emptied, but both still have bilious output present Ext: no edema  Lab Results:  Recent Labs    09/25/19 0230 09/26/19 0649  WBC 16.9* 21.4*  HGB 11.9* 11.8*  HCT 36.8* 36.4*  PLT 209 246   BMET Recent Labs    09/25/19 0230 09/26/19 0649  NA 135 136  K 3.9 4.0  CL 99 97*  CO2 25 28  GLUCOSE 177* 239*  BUN 16 17  CREATININE 1.04 0.87  CALCIUM 7.7* 8.1*   PT/INR No results for input(s): LABPROT, INR in the last 72 hours. CMP     Component Value Date/Time   NA 136 09/26/2019 0649   K 4.0 09/26/2019 0649   CL 97 (L) 09/26/2019 0649   CO2 28 09/26/2019 0649   GLUCOSE 239 (H) 09/26/2019 0649   BUN 17 09/26/2019 0649   CREATININE 0.87 09/26/2019 0649   CALCIUM 8.1 (L) 09/26/2019 0649   PROT 6.3 (L) 09/26/2019 0649   ALBUMIN 2.3 (L) 09/26/2019 0649   AST 17 09/26/2019 0649   ALT 26 09/26/2019 0649   ALKPHOS 119 09/26/2019 0649    BILITOT 1.2 09/26/2019 0649   GFRNONAA >60 09/26/2019 0649   GFRAA >60 09/26/2019 0649   Lipase     Component Value Date/Time   LIPASE 31 09/22/2019 0623       Studies/Results: DG Chest Port 1 View  Result Date: 09/25/2019 CLINICAL DATA:  Acute respiratory failure with hypoxemia. EXAM: PORTABLE CHEST 1 VIEW COMPARISON:  09/22/2019 FINDINGS: Stable mild cardiac enlargement. Very low lung volumes with vascular crowding and atelectasis. Progressive central vascular congestion without overt pulmonary edema. No definite pleural effusions. Right PICC line tip in good position in the distal SVC. IMPRESSION: 1. Very low lung volumes with vascular crowding and atelectasis. 2. Progressive central vascular congestion without overt pulmonary edema. Electronically Signed   By: Rudie Meyer M.D.   On: 09/25/2019 06:43    Anti-infectives: Anti-infectives (From admission, onward)   Start     Dose/Rate Route Frequency Ordered Stop   09/27/19 1000  anidulafungin (ERAXIS) 100 mg in sodium chloride 0.9 % 100 mL IVPB     Discontinue     100 mg 78 mL/hr over 100 Minutes Intravenous Every 24 hours 09/26/19 0819     09/26/19 1000  anidulafungin (ERAXIS) 200 mg in sodium chloride 0.9 % 200 mL IVPB     Discontinue  200 mg 78 mL/hr over 200 Minutes Intravenous  Once 09/26/19 0819     09/24/19 0730  piperacillin-tazobactam (ZOSYN) IVPB 3.375 g     Discontinue    Note to Pharmacy: Pharmacy to check dosing   3.375 g 12.5 mL/hr over 240 Minutes Intravenous Every 8 hours 09/24/19 0722     09/24/19 0600  cefoTEtan (CEFOTAN) 2 g in sodium chloride 0.9 % 100 mL IVPB        2 g 200 mL/hr over 30 Minutes Intravenous On call to O.R. 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 Laureen Ochs - 7/17             -persistent leak at GJ site.  Leak appeared to be about 10mm in size with some necrotic and edematous/inflammatory changes noted around this site.             -cont JP drains for now to control leak             -cont strict NPO/TNA for artificial nutrition since g-tube not placed             -at this point, the leak is going to be difficult to repair and so therefore, conservative management with NPO/drains is likely going to be the treatment pending he does not clinically decline             -mobilize, will order Pt to see today per RN evaluation yesterday             -pulm toilet/IS -cont zosyn, but add eraxis given persistently elevated WBC now up to 21.6 today -tachycardia improving -remain in a SDU for now  FEN - NPO/IVFs/TNA VTE - lovenox ID - zosyn 7/16 -->, Eraxis 7/20 -->   LOS: 3 days   Letha Cape , [facility] Surgery 09/26/2019, 9:36 AM Please see Amion for pager number during day hours 7:00am-4:30pm or 7:00am -11:30am on weekends  Agree with above. His wife Harriett Sine was at the bedside. The drains are about the same.  He does look better today - but his WBC has gone up.  He'll need to stay in the ICU for now.  His operating surgeon was Dr. Louann Sjogren in [place] - (cell number - [phone]).  I spoke to him on the phone.  He did a standard RYGB - his roux limb is 130 cm and his biliary limb is 45 cm.  He is going to FAX his operative note to our office.  Ovidio Kin, MD, [facility] Surgery Office phone:  [phone]

## 2019-09-26 NOTE — Progress Notes (Signed)
Inpatient Diabetes Program Recommendations  AACE/ADA: New Consensus Statement on Inpatient Glycemic Control (2015)  Target Ranges:  Prepandial:   less than 140 mg/dL      Peak postprandial:   less than 180 mg/dL (1-2 hours)      Critically ill patients:  140 - 180 mg/dL  Results for OAKLAN, PERSONS (MRN [id number]) as of 09/26/2019 11:51  Ref. Range 09/24/2019 18:31 09/25/2019 11:47 09/25/2019 17:41 09/25/2019 23:45 09/26/2019 05:59 09/26/2019 11:44  Glucose-Capillary Latest Ref Range: 70 - 99 mg/dL 440 (H) 347 (H) 425 (H) 218 (H) 234 (H) 205 (H)   Results for CALDER, OBLINGER (MRN [id number]) as of 09/26/2019 11:51  Ref. Range 09/22/2019 06:23  Glucose Latest Ref Range: 70 - 99 mg/dL 332 (H)   Review of Glycemic Control  Diabetes history: No Outpatient Diabetes medications: NA Current orders for Inpatient glycemic control: Novolog 0-9 units Q6H; TPN @ 100 ml/hr  Inpatient Diabetes Program Recommendations:    Insulin-Glucose elevated since starting TPN and noted to be in 200's mg/dl over the past 12 hours. Would recommend IV insulin be added to TPN; may want to consider starting with 15 units of insulin.  IF it is too late today for insulin to be added to TPN, would recommend a one time Lantus 15 units x1 now and add insulin to TPN starting tomorrow.  HbgA1C: Please consider ordering an A1C to evaluate glycemic control over the past 2-3 months.   Thanks, Orlando Penner, RN, MSN, CDE Diabetes Coordinator Inpatient Diabetes Program [phone] (Team Pager from 8am to 5pm)

## 2019-09-26 NOTE — Hospital Course (Signed)
The patient was admitted for observation initially due to tachycardia as CT scan showed some free air as expected post operatively, but nothing else suspicious.  The following day he developed worsening pain and worsening tachycardia.  He was taken to the OR where he underwent the above procedure.  He was found to have a leak at his GJ anastomosis.  This was unable to be repaired due to some necrosis and inflammatory changes of the tissues.  He had 2 surgical drains placed and was tx to the ICU for closer monitoring.  He was placed on zosyn.  Post operatively he continued to have a rising WBC and on POD 3, eraxis was added for antifungal coverage.  He was also noted on POD 1,2 to have bile coming from both of his JP drains c/w persistent leak.

## 2019-09-27 ENCOUNTER — Encounter (HOSPITAL_COMMUNITY): Payer: Self-pay

## 2019-09-27 LAB — COMPREHENSIVE METABOLIC PANEL
ALT: 41 U/L (ref 0–44)
AST: 35 U/L (ref 15–41)
Albumin: 2 g/dL — ABNORMAL LOW (ref 3.5–5.0)
Alkaline Phosphatase: 186 U/L — ABNORMAL HIGH (ref 38–126)
Anion gap: 12 (ref 5–15)
BUN: 16 mg/dL (ref 6–20)
CO2: 29 mmol/L (ref 22–32)
Calcium: 8 mg/dL — ABNORMAL LOW (ref 8.9–10.3)
Chloride: 98 mmol/L (ref 98–111)
Creatinine, Ser: 0.79 mg/dL (ref 0.61–1.24)
GFR calc Af Amer: >60 mL/min (ref >60)
GFR calc non Af Amer: >60 mL/min (ref >60)
Glucose, Bld: 190 mg/dL — ABNORMAL HIGH (ref 70–99)
Potassium: 3.9 mmol/L (ref 3.5–5.1)
Sodium: 139 mmol/L (ref 135–145)
Total Bilirubin: 1.5 mg/dL — ABNORMAL HIGH (ref 0.3–1.2)
Total Protein: 5.8 g/dL — ABNORMAL LOW (ref 6.5–8.1)

## 2019-09-27 LAB — CBC
HCT: 34.5 % — ABNORMAL LOW (ref 39.0–52.0)
Hemoglobin: 10.9 g/dL — ABNORMAL LOW (ref 13.0–17.0)
MCH: 29.5 pg (ref 26.0–34.0)
MCHC: 31.6 g/dL (ref 30.0–36.0)
MCV: 93.2 fL (ref 80.0–100.0)
Platelets: 224 10*3/uL (ref 150–400)
RBC: 3.7 MIL/uL — ABNORMAL LOW (ref 4.22–5.81)
RDW: 13.6 % (ref 11.5–15.5)
WBC: 16.3 10*3/uL — ABNORMAL HIGH (ref 4.0–10.5)
nRBC: 0.1 % (ref 0.0–0.2)

## 2019-09-27 LAB — CULTURE, BLOOD (ROUTINE X 2)
Culture: NO GROWTH
Culture: NO GROWTH
Special Requests: ADEQUATE

## 2019-09-27 LAB — TRIGLYCERIDES: Triglycerides: 319 mg/dL — ABNORMAL HIGH (ref <150)

## 2019-09-27 LAB — GLUCOSE, CAPILLARY
Glucose-Capillary: 143 mg/dL — ABNORMAL HIGH (ref 70–99)
Glucose-Capillary: 154 mg/dL — ABNORMAL HIGH (ref 70–99)
Glucose-Capillary: 157 mg/dL — ABNORMAL HIGH (ref 70–99)

## 2019-09-27 LAB — HEMOGLOBIN A1C
Hgb A1c MFr Bld: 5.6 % (ref 4.8–5.6)
Mean Plasma Glucose: 114.02 mg/dL

## 2019-09-27 LAB — MAGNESIUM: Magnesium: 2.3 mg/dL (ref 1.7–2.4)

## 2019-09-27 LAB — PHOSPHORUS: Phosphorus: 3.2 mg/dL (ref 2.5–4.6)

## 2019-09-27 MED ORDER — TRAVASOL 10 % IV SOLN
INTRAVENOUS | Status: AC
  Filled 2019-09-27: qty 1200

## 2019-09-27 NOTE — Discharge Summary (Signed)
[facility] Discharge Summary   Patient ID: Brad Hughes MRN: [id number] DOB/AGE: [birth year] 44 y.o.  Admit date: 09/22/2019 Discharge date: 10/12/2019  Discharge Diagnosis Patient Active Problem List   Diagnosis Date Noted  . Acute hypoxemic respiratory failure ([place]) 09/24/2019  . Pulmonary infiltrates 09/24/2019  . Sepsis following intra-abdominal surgery ([place]) 09/24/2019  . Dehydration 09/23/2019  . Post-operative state 09/22/2019  .  small bowel anastomotic leak     Consultants Critical Care Medicine   Imaging: CT ABDOMEN PELVIS  WITH CONTRAST 09/22/19  IMPRESSION: 1. Limited study demonstrating no central, lobar or proximal segmental sized pulmonary embolism. 2. The appearance of the dependent portions of the lungs suggest sequela of aspiration with a combination of atelectasis and airspace consolidation bilaterally in the lower lobes. 3. 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 There is also a small volume of ascites. 4. Severe hepatic steatosis. 5. Mild colonic diverticulosis without evidence to suggest an acute diverticulitis at this time.  DG CHEST PORT 1 VIEW 09/25/19 IMPRESSION: 1. Very low lung volumes with vascular crowding and atelectasis. 2. Progressive central vascular congestion without overt pulmonary edema.  Procedures 1. Dr. Erroll Luna, Assistant Dr. Johnathan Hausen 09/23/19 - Diagnostic laparoscopy with placement of drainage tubes and irrigation and debridement of intra-abdominal abscess and EGD 2. Dr. Earleen Newport (09/29/19) - Image guided drain placement, left pelvis.  95F pigtail drain.  ~600cc of maroon serosanguinous think fluid  HPI:  Brad Hughes is a 44 y/o male with a PMH obesity, HLD, anal fistula, and recent history of roux-en-y gastric  bypass who presented to the ED 09/22/19 with a cc uncontrolled abdominal pain. He states he was in [place] 09/20/19 for his roux-en-y gastric bypass by Dr. Olene Craven, was discharged home same day, and drove back [facility] 09/21/19. He states he developed acute abdominal pain last night that is constant and worse with movement. Pain is non-radiating. Denies associated fever, chills, nausea, or emesis. Reports he is having flatus but has not had a BM since surgery. He is scheduled for a telehealth follow up with his surgeon in two weeks. The patients wife is at the bedside.    Hospital Course:  In the ED the patient was tachycardic, had a low grade temp of 100.6, WBC 9.4, and CT abdomen pelvis as above, with some pneumoperitoneum. CT PE negative for pulmonary embolus. On exam the patient was alert, uncomfortable, and had appropriate abdominal tenderness without peritonitis. On the date of admission the pneumoperitoneum noted on his scan was attributed to his recent surgery and he was admitted to the hospital for observation, IV fluid rehydration, and pain control. On HD#1 the patients heart rate increased to the 150's, he developed more abdominal pain and distention, and he was taken to the operating room for diagnostic laparoscopy to exclude a leak from his pouch and/or JJ anastomosis. Intraoperatively there was noted to be a hole in the gastrojejunostomy anastomosis, confirmed by upper endoscopy, with bilious contamination of the abdomen. The anastomotic edges appeared edematous and friable and the decision was made no to attempt repair of the defect due to concerns it would make the defect worse. Two drains were placed to control the leak - one directly over the [place] site and one in the left upper quadrant under the subphrenic space. The patient was then extubated and transferred  to the ICU for ongoing management of sepsis secondary to peritonitis. The patient remained NPO and IV antibiotics were continued.  On POD#1 his vitals were improved and he was started on TPN. On POD#3 IV antibiotics were broadened to include antifungal coverage due to increasing leukocytosis. He was noted to have interval development of large fluid collection in the lower abdomen pelvis on CT 7/23 and therefore underwent image guided drain placement on 7/23. Culture of this fluid grew Enterobacter aerogenes; antifungal discontinued on 7/28 and he completed 16 days of zosyn. UGI 7/30 revealed no leak, therefore diet was advanced as tolerated. Once tolerating adequate nutrition by mouth the TPN was weaned to off. Our bariatric nurse coordinator met with the patient and provided education regarding diet, vitamin supplementation, and follow up. On 8/5 the patient was voiding well, tolerating diet, ambulating well, pain well controlled, vital signs stable, incisions c/d/i and felt stable for discharge home. He will go home with 3 abdominal drains in place. Patient will follow up as below and knows to call with questions or concerns.     Physical Exam: Gen: Alert, NAD, pleasant Pulm: normal rate and effort Abd: Soft,ND, mildTTP around LUQ drain, no rebound, guarding, or peritonitis,+BS,rightJP drainwithno fluid in bulb (0cc/24hr.Left surgical drain with less milky fluid and more serous/cloudy (10cc/24hr). LLQ IR drain with SS fluid (40cc/24hr) Ext: No LE edema Psych: A&Ox3  Skin: no rashes noted, warm and dry    Allergies as of 10/12/2019   No Known Allergies     Medication List    STOP taking these medications   cephALEXin 500 MG capsule Commonly known as: KEFLEX   HYDROcodone-acetaminophen 5-325 MG tablet Commonly known as: NORCO/VICODIN   ibuprofen 100 MG/5ML suspension Commonly known as: ADVIL   ondansetron 4 MG disintegrating tablet Commonly known as: ZOFRAN-ODT   oxyCODONE-acetaminophen 5-325 MG tablet Commonly known as: PERCOCET/ROXICET   Promethegan 25 MG suppository Generic drug: promethazine      TAKE these medications   acetaminophen 325 MG tablet Commonly known as: TYLENOL Take 2 tablets (650 mg total) by mouth every 6 (six) hours as needed for mild pain.   Ensure Max Protein Liqd Take 330 mLs (11 oz total) by mouth 2 (two) times daily.   hyoscyamine 0.125 MG SL tablet Commonly known as: LEVSIN SL Place 0.125 mg under the tongue every 6 (six) hours.   lidocaine 5 % Commonly known as: LIDODERM Place 1 patch onto the skin daily. Remove & Discard patch within 12 hours or as directed by MD   oxyCODONE 5 MG immediate release tablet Commonly known as: Oxy IR/ROXICODONE Take 1 tablet (5 mg total) by mouth every 6 (six) hours as needed for severe pain.   pantoprazole 40 MG tablet Commonly known as: PROTONIX Take 1 tablet (40 mg total) by mouth 2 (two) times daily. What changed: when to take this        Follow-up Information    Dr. Johnathan Hausen. Go on 10/11/2019.   Why: Your appointment is 8/25 at 11:15am Please arrive 30 minutes prior to your appointment to check in and fill out paperwork. Bring photo ID and insurance information. Contact information: [facility] [place], [place]   Telephone #: [phone]       Corrie Mckusick, DO. Call.   Specialties: Interventional Radiology, Radiology Why: call to arrange follow up with drain clinic Contact information: [address] [place] [address] [phone]  Signed: Margie Billet, [facility] 10/12/2019, 8:38 AM

## 2019-09-27 NOTE — Progress Notes (Signed)
Left JP drain has drained bile colored fluid overnight. Right JP drain has drained 37mL.Surgery paged. No new orders. Will continue to monitor patient.

## 2019-09-27 NOTE — Progress Notes (Signed)
PHARMACY - TOTAL PARENTERAL NUTRITION CONSULT NOTE   Indication: post-op bowel rest  Patient Measurements: Height: _0  (177.8 cm) Weight: 131.5 kg (290 lb) IBW/kg (Calculated) : 73 TPN AdjBW (KG): 87.6 Body mass index is 41.61 kg/m. Usual Weight: 131.5kg Adj body weight: ~ 100 kg  Assessment:  24 yoM admit 7/16 with post-op leak at [place] anastomosis (surgery 7/12), Expl Lap 7/17, drains placed Peritonitis, on Zosyn 3.375gm q8h, 7/20 Eraxis added   Glucose / Insulin: no hx DM, CBGs 190-234, SSI/24h 10 units; lantus 15 mg x 1 dose 7/20 at 1500; A1C ordered Electrolytes: Lytes WNL, CorrCa 9.6 Renal: SCr now WNL, UOP 600 ml/24 hrs, drain OP 525/24 hrs LFTs / TGs: LFTs Alk phos up to 185, T bili  Up 1.5; TG 398 >> 443> 319 Prealbumin / albumin: prealbumin < 5 on 7/19 2nd inflammatory process;  Albumin 2.0 Intake / Output; MIVF: drain output 525 mL, D5 & 1/2NS w/20 KCl at 25 ml/lhr GI Imaging: 7/16 Chest/Abd CT: no PE, possible aspiration/lungs, pneumoperitoneum/gas, ascites, severe hepatic steatosis, mild diverticulosis - no inflammation Surgeries / Procedures: 7/13 Roux-en-Y bypass in [place], was d/c same day. 7/17 Expl Lap at [facility] - repair anastomosis  Central access: PICC 7/18 TPN start date: 7/18  Nutritional Goals ( RD recommendations on 7/19 ): KCal: 2300-2500 , Protein: 115-130, Fluid: >/= 2.5 L/day Goal TPN rate is 100 mL/hr (provides 120  g of protein and 2388 kcals per day with lipids, 1908 kcals per day w/o lipids)  Current Nutrition:  NPO, aspiration precautions  Plan:   Continue TPN at goal rate  of 100 ml/hr without lipids - this provides 100% protein needs & ~ 83 % Kcal needs  Electrolytes in TPN: 76mq/L of Na, 463m/L of K, 98m6mL of Ca, 98mE17m of Mg, and 198mm86m of Phos    Cl:Ac ratio 1:1  Standard MVI and trace elements to TPN  Continue  Sensitive q6h SSI and adjust as needed   Add insulin 25 units to TPN  Continue MIVF to 25 mL/hr or per CCS  Monitor TPN  labs on Mon/Thurs, recheck Trig Friday 7/23- may be able to add lipids back   MicheEudelia Bunchrm.D 09/27/2019 8:21 AM

## 2019-09-27 NOTE — Progress Notes (Addendum)
4 Days Post-Op  Subjective:  Continues to feel better.  Pain is improving.  Thinks he may have passed flatus, but unsure if any stool was with it.  Voiding, but a little dark.  UOP down from 1200 to 600cc yesterday, but still + in I/Os.    ROS: See above, otherwise other systems negative  Objective: Vital signs in last 24 hours: Temp:  [97.6 F (36.4 C)-99.5 F (37.5 C)] 97.6 F (36.4 C) (07/21 0345) Pulse Rate:  [94-107] 101 (07/21 0500) Resp:  [16-25] 16 (07/21 0500) BP: (128-166)/(73-91) 134/76 (07/21 0500) SpO2:  [91 %-99 %] 97 % (07/21 0500) Last BM Date: 09/18/19  Intake/Output from previous day: 07/20 0701 - 07/21 0700 In: 1445.9 [I.V.:1052.6; IV Piggyback:393.3] Out: 1125 [Urine:600; Drains:525] Intake/Output this shift: No intake/output data recorded.  PE: Gen: NAD Heart: regular Lungs: CTAB Abd: soft, still bloated like, hypoactive BS,   Left JP drain with thick bilious output, right JP drain with no output currently.  Left with over 500cc yesterday.  Incisions c/d/i  Lab Results:  Recent Labs    09/26/19 0649 09/27/19 0600  WBC 21.4* 16.3*  HGB 11.8* 10.9*  HCT 36.4* 34.5*  PLT 246 224   BMET Recent Labs    09/26/19 0649 09/27/19 0600  NA 136 139  K 4.0 3.9  CL 97* 98  CO2 28 29  GLUCOSE 239* 190*  BUN 17 16  CREATININE 0.87 0.79  CALCIUM 8.1* 8.0*   PT/INR No results for input(s): LABPROT, INR in the last 72 hours. CMP     Component Value Date/Time   NA 139 09/27/2019 0600   K 3.9 09/27/2019 0600   CL 98 09/27/2019 0600   CO2 29 09/27/2019 0600   GLUCOSE 190 (H) 09/27/2019 0600   BUN 16 09/27/2019 0600   CREATININE 0.79 09/27/2019 0600   CALCIUM 8.0 (L) 09/27/2019 0600   PROT 5.8 (L) 09/27/2019 0600   ALBUMIN 2.0 (L) 09/27/2019 0600   AST 35 09/27/2019 0600   ALT 41 09/27/2019 0600   ALKPHOS 186 (H) 09/27/2019 0600   BILITOT 1.5 (H) 09/27/2019 0600   GFRNONAA >60 09/27/2019 0600   GFRAA >60 09/27/2019 0600   Lipase      Component Value Date/Time   LIPASE 31 09/22/2019 0623       Studies/Results: No results found.  Anti-infectives: Anti-infectives (From admission, onward)   Start     Dose/Rate Route Frequency Ordered Stop   09/27/19 1000  anidulafungin (ERAXIS) 100 mg in sodium chloride 0.9 % 100 mL IVPB     Discontinue     100 mg 78 mL/hr over 100 Minutes Intravenous Every 24 hours 09/26/19 0819     09/26/19 1000  anidulafungin (ERAXIS) 200 mg in sodium chloride 0.9 % 200 mL IVPB        200 mg 78 mL/hr over 200 Minutes Intravenous  Once 09/26/19 0819 09/26/19 1334   09/24/19 0730  piperacillin-tazobactam (ZOSYN) IVPB 3.375 g     Discontinue    Note to Pharmacy: Pharmacy to check dosing   3.375 g 12.5 mL/hr over 240 Minutes Intravenous Every 8 hours 09/24/19 0722     09/24/19 0600  cefoTEtan (CEFOTAN) 2 g in sodium chloride 0.9 % 100 mL IVPB        2 g 200 mL/hr over 30 Minutes Intravenous On call to O.R. 09/23/19 1735 09/23/19 1830   09/22/19 0915  piperacillin-tazobactam (ZOSYN) IVPB 3.375 g  3.375 g 100 mL/hr over 30 Minutes Intravenous  Once 09/22/19 0906 09/22/19 1001       Assessment/Plan Obesity - BMI 41 Hyperglycemia - likely mostly related to TNA, but was slightly elevated prior to TNA.  Will check hgb A1C  POD 4, 8, s/p lap roux-en-y bypass by Dr. Audree Camel in [place] - 7/13,             Dx lap with drainage of intra-abdominal abscess, identification of leak via EGD, and placement of 2 jp drains, Dr. Laureen Ochs - 7/17 -persistent leak at GJ site. Leak appeared to be about 37mm in size with some necrotic and edematous/inflammatory changes noted around this site. -cont JP drains for now to control leak -cont strict NPO/TNA for artificial nutrition since g-tube not placed -at this point, the leak is going to be difficult to repair and so therefore, conservative management with NPO/drains is likely going to be the treatment pending  he does not clinically decline -mobilize, PT no HH needs, but will benefit from skilled PT while here -pulm toilet/IS, needs to really work on deep breathing.  Try to wean O2 as able.  -cont zosyn, eraxis, WBC improving today to 16K.  follow  -tx to floor today, DC tele -monitor I/Os.  Still positive some but UOP decreased some.  May need a small dose of lasix tomorrow, but will monitor.  FEN -NPO/IVFs/TNA VTE -lovenox ID -zosyn 7/16 -->, Eraxis 7/20 -->   LOS: 4 days    Letha Cape , [facility] 09/27/2019, 7:47 AM Please see Amion for pager number during day hours 7:00am-4:30pm or 7:00am -11:30am on weekends  Agree with above. He feels better and his WBC is improving.  He has a significant amount of out put from his LUQ drain.  Ovidio Kin, MD, [facility] Office phone:  [phone]

## 2019-09-28 LAB — COMPREHENSIVE METABOLIC PANEL
ALT: 54 U/L — ABNORMAL HIGH (ref 0–44)
AST: 38 U/L (ref 15–41)
Albumin: 2.2 g/dL — ABNORMAL LOW (ref 3.5–5.0)
Alkaline Phosphatase: 222 U/L — ABNORMAL HIGH (ref 38–126)
Anion gap: 9 (ref 5–15)
BUN: 15 mg/dL (ref 6–20)
CO2: 26 mmol/L (ref 22–32)
Calcium: 7.9 mg/dL — ABNORMAL LOW (ref 8.9–10.3)
Chloride: 101 mmol/L (ref 98–111)
Creatinine, Ser: 0.89 mg/dL (ref 0.61–1.24)
GFR calc Af Amer: >60 mL/min (ref >60)
GFR calc non Af Amer: >60 mL/min (ref >60)
Glucose, Bld: 142 mg/dL — ABNORMAL HIGH (ref 70–99)
Potassium: 3.6 mmol/L (ref 3.5–5.1)
Sodium: 136 mmol/L (ref 135–145)
Total Bilirubin: 1.4 mg/dL — ABNORMAL HIGH (ref 0.3–1.2)
Total Protein: 6.2 g/dL — ABNORMAL LOW (ref 6.5–8.1)

## 2019-09-28 LAB — MAGNESIUM: Magnesium: 2.2 mg/dL (ref 1.7–2.4)

## 2019-09-28 LAB — GLUCOSE, CAPILLARY
Glucose-Capillary: 153 mg/dL — ABNORMAL HIGH (ref 70–99)
Glucose-Capillary: 158 mg/dL — ABNORMAL HIGH (ref 70–99)
Glucose-Capillary: 157 mg/dL — ABNORMAL HIGH (ref 70–99)

## 2019-09-28 LAB — CBC
HCT: 33.4 % — ABNORMAL LOW (ref 39.0–52.0)
Hemoglobin: 10.6 g/dL — ABNORMAL LOW (ref 13.0–17.0)
MCH: 28.5 pg (ref 26.0–34.0)
MCHC: 31.7 g/dL (ref 30.0–36.0)
MCV: 89.8 fL (ref 80.0–100.0)
Platelets: 258 10*3/uL (ref 150–400)
RBC: 3.72 MIL/uL — ABNORMAL LOW (ref 4.22–5.81)
RDW: 13.5 % (ref 11.5–15.5)
WBC: 18.9 10*3/uL — ABNORMAL HIGH (ref 4.0–10.5)
nRBC: 0.1 % (ref 0.0–0.2)

## 2019-09-28 LAB — PHOSPHORUS: Phosphorus: 3.9 mg/dL (ref 2.5–4.6)

## 2019-09-28 MED ORDER — POTASSIUM CHLORIDE 10 MEQ/100ML IV SOLN
10.0000 meq | INTRAVENOUS | Status: AC
  Administered 2019-09-28 (×2): 10 meq via INTRAVENOUS
  Filled 2019-09-28 (×2): qty 100

## 2019-09-28 MED ORDER — TRAVASOL 10 % IV SOLN
INTRAVENOUS | Status: AC
  Filled 2019-09-28: qty 1200

## 2019-09-28 MED ORDER — FUROSEMIDE 10 MG/ML IJ SOLN
40.0000 mg | Freq: Once | INTRAMUSCULAR | Status: AC
  Administered 2019-09-28: 10:00:00 40 mg via INTRAVENOUS
  Filled 2019-09-28: qty 4

## 2019-09-28 NOTE — Progress Notes (Signed)
PHARMACY - TOTAL PARENTERAL NUTRITION CONSULT NOTE   Indication: post-op bowel rest  Patient Measurements: Height: '5\' 10"'  (177.8 cm) Weight: 131.5 kg (290 lb) IBW/kg (Calculated) : 73 TPN AdjBW (KG): 87.6 Body mass index is 41.61 kg/m. Usual Weight: 131.5kg Adj body weight: ~ 100 kg  Assessment:  80 yoM admit 7/16 with post-op leak at [place] anastomosis (surgery 7/12), Expl Lap 7/17, drains placed Peritonitis, on Zosyn 3.375gm q8h, 7/20 Eraxis added   Glucose / Insulin: no hx DM, A1c is 5.6, CBGs 143-157, SSI/24h 7 units; - TPN currently with 25 units of insulin in bag  Electrolytes: K+ a bit low at 3.6, Other electroytes WNL, CorrCa 9.6 Renal: SCr now WNL,  - Pt ordered one time dose of Lasix for today  LFTs / TGs: LFTs slightly up, Alk phos up to 222, T bili 1.54; TG 398 >> 443> 319 Prealbumin / albumin: prealbumin < 5 on 7/19 2nd inflammatory process;  Albumin 2.0 Intake / Output; MIVF:  UOP 675 ml/24 hrs, drain OP 60 ml/24 hrs,  D5 & 1/2NS w/20 KCl at 25 ml/lhr GI Imaging: 7/16 Chest/Abd CT: no PE, possible aspiration/lungs, pneumoperitoneum/gas, ascites, severe hepatic steatosis, mild diverticulosis - no inflammation Surgeries / Procedures: 7/13 Roux-en-Y bypass in [place], was d/c same day. 7/17 Expl Lap at [facility] - repair anastomosis  Central access: PICC 7/18 TPN start date: 7/18  Nutritional Goals ( RD recommendations on 7/19 ): KCal: 2300-2500 , Protein: 115-130, Fluid: >/= 2.5 L/day Goal TPN rate is 100 mL/hr (provides 120  g of protein and 2388 kcals per day with lipids, 1908 kcals per day w/o lipids)  Current Nutrition:  NPO, aspiration precautions  - Potassium chloride 10 meq IV x 2   Plan:   Continue TPN at goal rate  of 100 ml/hr without lipids - this provides 100% protein needs & ~ 83 % Kcal needs  Electrolytes in TPN: 50mq/L of Na, inc to 5641m/L of K, 41m70mL of Ca, 41mE59m of Mg, and 141mm46m of Phos    Cl:Ac ratio 1:1  Standard MVI and trace elements to  TPN  Continue  Sensitive q6h SSI and adjust as needed   Continue insulin 25 units to TPN  Continue MIVF to 25 mL/hr or per CCS  BMP, magnesium, phosphorus, triglycerides with AM labs  Monitor TPN labs on Mon/Thurs  NikolRoyetta AsalrmD, BCPS 09/28/2019 10:53 AM

## 2019-09-28 NOTE — Progress Notes (Addendum)
Patient ID: Brad Hughes, male   DOB: [birth year], 44 y.o.   MRN: [id number]    5 Days Post-Op  Subjective: Continues to feel better.  Had a BM yesterday and passed a small amount of flatus.  No nausea or vomiting.  Mobilized once yesterday.  Pulls 750 on IS consistently.  Voiding but dark.  Output around 600cc/day  ROS: See above, otherwise other systems negative  Objective: Vital signs in last 24 hours: Temp:  [98.6 F (37 C)-99.4 F (37.4 C)] 98.6 F (37 C) (07/22 0555) Pulse Rate:  [94-107] 95 (07/22 0555) Resp:  [16-22] 16 (07/22 0555) BP: (123-146)/(69-87) 146/85 (07/22 0555) SpO2:  [93 %-98 %] 96 % (07/22 0555) Last BM Date: 09/27/19  Intake/Output from previous day: 07/21 0701 - 07/22 0700 In: 2023.1 [I.V.:1786.1; IV Piggyback:237] Out: 735 [Urine:675; Drains:60] Intake/Output this shift: No intake/output data recorded.  PE: Heart: regular Lungs: CTAB Abd: soft, incisions are c/d/i, hypoactive BS, right JP drain with minimal to no output.  Left JP drain with purulent/brownish tan thick output with small black/dark green flecks present.  Very foul smelling.  Currently bulb is over half way full. Ext: no edema  Lab Results:  Recent Labs    09/27/19 0600 09/28/19 0406  WBC 16.3* 18.9*  HGB 10.9* 10.6*  HCT 34.5* 33.4*  PLT 224 258   BMET Recent Labs    09/27/19 0600 09/28/19 0406  NA 139 136  K 3.9 3.6  CL 98 101  CO2 29 26  GLUCOSE 190* 142*  BUN 16 15  CREATININE 0.79 0.89  CALCIUM 8.0* 7.9*   PT/INR No results for input(s): LABPROT, INR in the last 72 hours. CMP     Component Value Date/Time   NA 136 09/28/2019 0406   K 3.6 09/28/2019 0406   CL 101 09/28/2019 0406   CO2 26 09/28/2019 0406   GLUCOSE 142 (H) 09/28/2019 0406   BUN 15 09/28/2019 0406   CREATININE 0.89 09/28/2019 0406   CALCIUM 7.9 (L) 09/28/2019 0406   PROT 6.2 (L) 09/28/2019 0406   ALBUMIN 2.2 (L) 09/28/2019 0406   AST 38 09/28/2019 0406   ALT 54 (H) 09/28/2019 0406    ALKPHOS 222 (H) 09/28/2019 0406   BILITOT 1.4 (H) 09/28/2019 0406   GFRNONAA >60 09/28/2019 0406   GFRAA >60 09/28/2019 0406   Lipase     Component Value Date/Time   LIPASE 31 09/22/2019 0623       Studies/Results: No results found.  Anti-infectives: Anti-infectives (From admission, onward)   Start     Dose/Rate Route Frequency Ordered Stop   09/27/19 1000  anidulafungin (ERAXIS) 100 mg in sodium chloride 0.9 % 100 mL IVPB     Discontinue     100 mg 78 mL/hr over 100 Minutes Intravenous Every 24 hours 09/26/19 0819     09/26/19 1000  anidulafungin (ERAXIS) 200 mg in sodium chloride 0.9 % 200 mL IVPB        200 mg 78 mL/hr over 200 Minutes Intravenous  Once 09/26/19 0819 09/26/19 1334   09/24/19 0730  piperacillin-tazobactam (ZOSYN) IVPB 3.375 g     Discontinue    Note to Pharmacy: Pharmacy to check dosing   3.375 g 12.5 mL/hr over 240 Minutes Intravenous Every 8 hours 09/24/19 0722     09/24/19 0600  cefoTEtan (CEFOTAN) 2 g in sodium chloride 0.9 % 100 mL IVPB        2 g 200 mL/hr over 30 Minutes Intravenous On call  to O.R. 09/23/19 1735 09/23/19 1830   09/22/19 0915  piperacillin-tazobactam (ZOSYN) IVPB 3.375 g        3.375 g 100 mL/hr over 30 Minutes Intravenous  Once 09/22/19 0906 09/22/19 1001       Assessment/Plan Obesity - BMI 41 Hyperglycemia - related to TNA, HgbA1C is normal  POD5,9, s/p lap roux-en-y bypass by Dr. 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 Laureen Ochs- 7/17 -persistent leak at GJ site. Leak appeared to be about 39mm in size with some necrotic and edematous/inflammatory changes noted around this site. -cont JP drains for now to control leak.  One of the drains is putting out the most and today it has turned from succus to purulent, foul smelling drainage. -cont strict NPO/TNA for artificial  nutrition since g-tube not placed -at this point, the leak is going to be difficult to repair and so therefore, conservative management with NPO/drains is likely going to be the treatment pending he does not clinically decline -mobilize, PT no HH needs, but will benefit from skilled PT while here -pulm toilet/IS, needs to really work on deep breathing.  O2 off currently             -cont zosyn, eraxis, WBC 18K.  CT tomorrow to further assess leak and if there are other fluid collections that are undrained  -UOP still around 600.  Over 1-3L positive for the last several days.  No edema on exam, but given decrease in UOP and + status, will give a one time dose of lasix today.  Check BMET in am  FEN -NPO/IVFs/TNA VTE -lovenox ID -zosyn7/16 -->, Eraxis 7/20 -->   LOS: 5 days    Letha Cape , [facility] 09/28/2019, 8:29 AM Please see Amion for pager number during day hours 7:00am-4:30pm or 7:00am -11:30am on weekends  Agree with above. He was supposed to be in his sister in laws wedding this weekend in [place].  Stable overall.  Drain output has changed in color and is at a lower volume.  Probable CT scan tomorrow.  Ovidio Kin, MD, [facility] Office phone:  [phone]

## 2019-09-28 NOTE — Progress Notes (Signed)
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 Laureen Ochs - 7/17    PT Comments    Pt amb earlier with spouse.  Assisted OOB to amb again. General bed mobility comments: assist with upper body due to ABD pain and c/o ABD swelling(increased ABD girth)   General transfer comment: assist with multiple lines and pull up vs push up due to BMI  General Gait Details: tolerated an increased distance and required walker for slight instability.  Beleive pt will not need once home.  Assisted back to bed per pt request.  Spouse assisted by supporting B LE's up onto bed.   Follow Up Recommendations  No PT follow up     Equipment Recommendations  None recommended by PT    Recommendations for Other Services       Precautions / Restrictions Precautions Precaution Comments: abd drain, NPO    Mobility  Bed Mobility Overal bed mobility: Needs Assistance Bed Mobility: Supine to Sit     Supine to sit: Min assist;Mod assist     General bed mobility comments: assist with upper body due to ABD pain and c/o ABD swelling(increased ABD girth)  Transfers Overall transfer level: Needs assistance Equipment used: Rolling walker (2 wheeled) Transfers: Sit to/from Stand Sit to Stand: Supervision;Min guard         General transfer comment: assist with multiple lines and pull up vs push up due to BMI  Ambulation/Gait Ambulation/Gait assistance: Modified independent (Device/Increase time) Gait Distance (Feet): 325 Feet Assistive device: Rolling walker (2 wheeled) Gait Pattern/deviations: Step-through pattern Gait velocity: decr   General Gait Details: tolerated an increased distance and  required walker for slight instability.  Beleive pt will not need once home.   Stairs             Wheelchair Mobility    Modified Rankin (Stroke Patients Only)       Balance                                            Cognition Arousal/Alertness: Awake/alert   Overall Cognitive Status: Within Functional Limits for tasks assessed                                 General Comments: AxO very motivated      Exercises      General Comments        Pertinent Vitals/Pain Pain Assessment: Faces Faces Pain Scale: Hurts a little bit Pain Location: ABD Pain Descriptors / Indicators: Tender;Cramping Pain Intervention(s): Monitored during session    Home Living                      Prior Function            PT Goals (current goals can now be found in the care plan section) Progress towards PT goals: Progressing toward goals    Frequency    Min 3X/week      PT Plan Current plan remains appropriate    Co-evaluation  AM-PAC PT "6 Clicks" Mobility   Outcome Measure  Help needed turning from your back to your side while in a flat bed without using bedrails?: A Little Help needed moving from lying on your back to sitting on the side of a flat bed without using bedrails?: A Little Help needed moving to and from a bed to a chair (including a wheelchair)?: A Little Help needed standing up from a chair using your arms (e.g., wheelchair or bedside chair)?: A Little Help needed to walk in hospital room?: None Help needed climbing 3-5 steps with a railing? : A Little 6 Click Score: 19    End of Session   Activity Tolerance: Patient tolerated treatment well Patient left: in bed;with call bell/phone within reach;with family/visitor present Nurse Communication: Mobility status PT Visit Diagnosis: Difficulty in walking, not elsewhere classified (R26.2)     Time: 1040-1053 PT Time Calculation (min) (ACUTE  ONLY): 13 min  Charges:  $Gait Training: 8-22 mins                     Felecia Shelling  PTA Acute  Rehabilitation Services Pager      [phone] Office      [phone]

## 2019-09-29 ENCOUNTER — Inpatient Hospital Stay (HOSPITAL_COMMUNITY): Payer: Medicaid Other

## 2019-09-29 ENCOUNTER — Encounter (HOSPITAL_COMMUNITY): Payer: Self-pay

## 2019-09-29 LAB — BASIC METABOLIC PANEL
Anion gap: 11 (ref 5–15)
BUN: 16 mg/dL (ref 6–20)
CO2: 25 mmol/L (ref 22–32)
Calcium: 8.1 mg/dL — ABNORMAL LOW (ref 8.9–10.3)
Chloride: 100 mmol/L (ref 98–111)
Creatinine, Ser: 0.77 mg/dL (ref 0.61–1.24)
GFR calc Af Amer: >60 mL/min (ref >60)
GFR calc non Af Amer: >60 mL/min (ref >60)
Glucose, Bld: 164 mg/dL — ABNORMAL HIGH (ref 70–99)
Potassium: 3.7 mmol/L (ref 3.5–5.1)
Sodium: 136 mmol/L (ref 135–145)

## 2019-09-29 LAB — PROTIME-INR
INR: 1 (ref 0.8–1.2)
Prothrombin Time: 13 seconds (ref 11.4–15.2)

## 2019-09-29 LAB — CBC
HCT: 34.6 % — ABNORMAL LOW (ref 39.0–52.0)
Hemoglobin: 10.8 g/dL — ABNORMAL LOW (ref 13.0–17.0)
MCH: 28.3 pg (ref 26.0–34.0)
MCHC: 31.2 g/dL (ref 30.0–36.0)
MCV: 90.8 fL (ref 80.0–100.0)
Platelets: 306 10*3/uL (ref 150–400)
RBC: 3.81 MIL/uL — ABNORMAL LOW (ref 4.22–5.81)
RDW: 13.4 % (ref 11.5–15.5)
WBC: 18.7 10*3/uL — ABNORMAL HIGH (ref 4.0–10.5)
nRBC: 0 % (ref 0.0–0.2)

## 2019-09-29 LAB — GLUCOSE, CAPILLARY
Glucose-Capillary: 131 mg/dL — ABNORMAL HIGH (ref 70–99)
Glucose-Capillary: 132 mg/dL — ABNORMAL HIGH (ref 70–99)
Glucose-Capillary: 140 mg/dL — ABNORMAL HIGH (ref 70–99)
Glucose-Capillary: 157 mg/dL — ABNORMAL HIGH (ref 70–99)
Glucose-Capillary: 96 mg/dL (ref 70–99)

## 2019-09-29 LAB — TRIGLYCERIDES: Triglycerides: 236 mg/dL — ABNORMAL HIGH (ref <150)

## 2019-09-29 LAB — PHOSPHORUS: Phosphorus: 4.5 mg/dL (ref 2.5–4.6)

## 2019-09-29 LAB — MAGNESIUM: Magnesium: 2.4 mg/dL (ref 1.7–2.4)

## 2019-09-29 MED ORDER — MIDAZOLAM HCL 2 MG/2ML IJ SOLN
INTRAMUSCULAR | Status: AC | PRN
  Administered 2019-09-29 (×2): 1 mg via INTRAVENOUS

## 2019-09-29 MED ORDER — IOHEXOL 9 MG/ML PO SOLN
ORAL | Status: AC
  Filled 2019-09-29: qty 1000

## 2019-09-29 MED ORDER — IOHEXOL 9 MG/ML PO SOLN
500.0000 mL | ORAL | Status: AC
  Administered 2019-09-29 (×2): 500 mL via ORAL

## 2019-09-29 MED ORDER — SODIUM CHLORIDE (PF) 0.9 % IJ SOLN
INTRAMUSCULAR | Status: AC
  Filled 2019-09-29: qty 50

## 2019-09-29 MED ORDER — IOHEXOL 300 MG/ML  SOLN
100.0000 mL | Freq: Once | INTRAMUSCULAR | Status: AC | PRN
  Administered 2019-09-29: 100 mL via INTRAVENOUS

## 2019-09-29 MED ORDER — MIDAZOLAM HCL 2 MG/2ML IJ SOLN
INTRAMUSCULAR | Status: AC
  Filled 2019-09-29: qty 4

## 2019-09-29 MED ORDER — POTASSIUM CHLORIDE 10 MEQ/100ML IV SOLN
10.0000 meq | INTRAVENOUS | Status: AC
  Administered 2019-09-29 (×2): 10 meq via INTRAVENOUS
  Filled 2019-09-29: qty 100

## 2019-09-29 MED ORDER — TRAVASOL 10 % IV SOLN
INTRAVENOUS | Status: AC
  Filled 2019-09-29: qty 1200

## 2019-09-29 MED ORDER — FENTANYL CITRATE (PF) 100 MCG/2ML IJ SOLN
INTRAMUSCULAR | Status: AC | PRN
  Administered 2019-09-29 (×3): 50 ug via INTRAVENOUS

## 2019-09-29 MED ORDER — FENTANYL CITRATE (PF) 100 MCG/2ML IJ SOLN
INTRAMUSCULAR | Status: AC
  Filled 2019-09-29: qty 4

## 2019-09-29 NOTE — Progress Notes (Signed)
PHARMACY - TOTAL PARENTERAL NUTRITION CONSULT NOTE   Indication: post-op bowel rest  Patient Measurements: Height: _0  (177.8 cm) Weight: 131.5 kg (290 lb) IBW/kg (Calculated) : 73 TPN AdjBW (KG): 87.6 Body mass index is 41.61 kg/m. Usual Weight: 131.5kg Adj body weight: ~ 100 kg  Assessment:  59 yoM admit 7/16 with post-op leak at [place] anastomosis (surgery 7/12), Expl Lap 7/17, drains placed Peritonitis, on Zosyn 3.375gm q8h, 7/20 Eraxis added   Glucose / Insulin: no hx DM, A1c is 5.6, CBGs 143-157, SSI/24h 6 units; - TPN currently with 25 units of insulin in bag  Electrolytes: K+ a bit low at 3.7, phosphorus trending up at 4.5 but WNL, Other electroytes WNL, CorrCa 9.6 Renal: SCr  WNL LFTs / TGs: LFTs slightly up, Alk phos up to 222, T bili 1.54; TG 398 >> 443> 319 >> 236 - trending down  Prealbumin / albumin: prealbumin < 5 on 7/19 2nd inflammatory process;  Albumin 2.0 Intake / Output; MIVF:  UOP 3400 ml/24 hrs ( did receive dose of Lasix on 7/22), drain OP 50 ml/24 hrs,  D5 & 1/2NS w/20 KCl at 25 ml/lhr GI Imaging: 7/16 Chest/Abd CT: no PE, possible aspiration/lungs, pneumoperitoneum/gas, ascites, severe hepatic steatosis, mild diverticulosis - no inflammation Surgeries / Procedures: 7/13 Roux-en-Y bypass in [place], was d/c same day. 7/17 Expl Lap at [facility] - repair anastomosis  Central access: PICC 7/18 TPN start date: 7/18  Nutritional Goals ( RD recommendations on 7/19 ): KCal: 2300-2500 , Protein: 115-130, Fluid: >/= 2.5 L/day Goal TPN rate is 100 mL/hr (provides 120  g of protein and 2388 kcals per day with lipids, 1908 kcals per day w/o lipids)  Current Nutrition:  NPO, aspiration precautions  - Potassium chloride 10 meq IV x 2   Plan:   Continue TPN at goal rate  of 100 ml/hr  - Will add 50% of goal lipids as triglycerides have trended down   Electrolytes in TPN: 72mq/L of Na, inc to 631m/L of K, 63m30mL of Ca, 63mE63m of Mg, and decrease to 63mmo63m of Phosphorus     Cl:Ac ratio 1:1  Standard MVI and trace elements to TPN  Continue  Sensitive q6h SSI and adjust as needed   Increase insulin 30 units to TPN  Continue MIVF to 25 mL/hr or per CCS  BMP, magnesium, phosphorus, triglycerides with AM labs   Monitor TPN labs on Mon/Thurs  NikolRoyetta AsalrmD, BCPS 09/29/2019 10:49 AM

## 2019-09-29 NOTE — Sedation Documentation (Signed)
Attempted to call report to RN will give at bedside

## 2019-09-29 NOTE — Discharge Instructions (Signed)
Percutaneous Abscess Drain, Care After This sheet gives you information about how to care for yourself after your procedure. Your health care provider may also give you more specific instructions. If you have problems or questions, contact your health care provider. What can I expect after the procedure? After your procedure, it is common to have:  A small amount of bruising and discomfort in the area where the drainage tube (catheter) was placed.  Sleepiness and fatigue. This should go away after the medicines you were given have worn off. Follow these instructions at home: Incision care  Follow instructions from your health care provider about how to take care of your incision. Make sure you: ? Wash your hands with soap and water before you change your bandage (dressing). If soap and water are not available, use hand sanitizer. ? Change your dressing as told by your health care provider. ? Leave stitches (sutures), skin glue, or adhesive strips in place. These skin closures may need to stay in place for 2 weeks or longer. If adhesive strip edges start to loosen and curl up, you may trim the loose edges. Do not remove adhesive strips completely unless your health care provider tells you to do that.  Check your incision area every day for signs of infection. Check for: ? More redness, swelling, or pain. ? More fluid or blood. ? Warmth. ? Pus or a bad smell. ? Fluid leaking from around your catheter (instead of fluid draining through your catheter). Catheter care   Follow instructions from your health care provider about emptying and cleaning your catheter and collection bag. You may need to clean the catheter every day so it does not clog.  If directed, write down the following information every time you empty your bag: ? The date and time. ? The amount of drainage. General instructions  Rest at home for 1-2 days after your procedure. Return to your normal activities as told by your  health care provider.  Do not take baths, swim, or use a hot tub for 24 hours after your procedure, or until your health care provider says that this is okay.  Take over-the-counter and prescription medicines only as told by your health care provider.  Keep all follow-up visits as told by your health care provider. This is important. Contact a health care provider if:  You have less than 10 mL of drainage a day for 2-3 days in a row, or as directed by your health care provider.  You have more redness, swelling, or pain around your incision area.  You have more fluid or blood coming from your incision area.  Your incision area feels warm to the touch.  You have pus or a bad smell coming from your incision area.  You have fluid leaking from around your catheter (instead of through your catheter).  You have a fever or chills.  You have pain that does not get better with medicine. Get help right away if:  Your catheter comes out.  You suddenly stop having drainage from your catheter.  You suddenly have blood in the fluid that is draining from your catheter.  You become dizzy or you faint.  You develop a rash.  You have nausea or vomiting.  You have difficulty breathing or you feel short of breath.  You develop chest pain.  You have problems with your speech or vision.  You have trouble balancing or moving your arms or legs. Summary  It is common to have a small   amount of bruising and discomfort in the area where the drainage tube (catheter) was placed.  You may be directed to record the amount of drainage from the bag every time you empty it.  Follow instructions from your health care provider about emptying and cleaning your catheter and collection bag. This information is not intended to replace advice given to you by your health care provider. Make sure you discuss any questions you have with your health care provider. Document Revised: 02/05/2017 Document  Reviewed: 01/16/2016 [employer] Patient Education  2020 [employer]  

## 2019-09-29 NOTE — Consult Note (Signed)
Chief Complaint: Patient was seen in consultation today for intra-abdominal fluid collection  Referring Physician(s): Dr. Ezzard Standing  Supervising Physician: Gilmer Mor  Patient Status: [facility] - In-pt  History of Present Illness: Brad Hughes is a 44 y.o. male with history of HLD presented to [facility] 09/22/19 with severe abdominal pain s/p outpatient lap Roux-en-Y bypass surgery in [place] 09/20/19.  Initial imagine was negative for a leak, he was admitted for post-operative care and hydration, however he continued with abdominal pain and tachycardia.  He underwent diagnostic laparoscopy which showed breakdown of the anastomosis with peritonitis, 2 surgical drains were left in place. CT Abdomen Pelvis repeated today shows: 1. Postoperative changes from gastric bypass surgery. Signs of suture line dehiscence with leak at the gastrojejunostomy site. Similar to the exam from 09/22/2019. 2. Persistent collection of gas as well as enteric contrast material which extends from the GJ suture line to the undersurface of the lateral segment of left lobe of liver. 3. Interval development of large fluid collection within the lower abdomen and pelvis. A second large fluid collection extends along the undersurface of the right hepatic lobe. Additional small interloop fluid collections are present elsewhere in the abdomen and pelvis. 4. Contrast opacified jejunal bowel loops appear increased in caliber concerning for paralytic ileus. 5. Similar appearance of bilateral lower lobe airspace consolidations and or atelectasis.  IR consulted for aspiration and drainage of intra-abdominal fluid collection.  Case reviewed and approved by Dr. Loreta Ave.   Brad Hughes is assessed at bedside. He complain of abdominal pain, distention.  States he has pain when initiating a urine stream-- described more as pressure. He has been NPO-- on TPN.  He has 2 surgical drains in place.  The LUQ drain with purulent-appearing  fluid.   Past Medical History:  Diagnosis Date  . Anal fistula 2017  . Hyperlipidemia   . 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   POD#2 s/p roux-en-y gastric bypass     Past Surgical History:  Procedure Laterality Date  . ANAL FISTULOTOMY N/A 12/26/2015   Procedure: FISTULOTOMY;  Surgeon: Romie Levee, MD;  Location: [facility];  Service: General;  Laterality: N/A;  . LAPAROSCOPY N/A 09/23/2019   Procedure: LAPAROSCOPY DIAGNOSTIC, EGD, Drainage of intra-abdominal abscess;  Surgeon: Harriette Bouillon, MD;  Location: [facility];  Service: General;  Laterality: N/A;  . RECTAL EXAM UNDER ANESTHESIA  2015  . RECTAL EXAM UNDER ANESTHESIA N/A 12/26/2015   Procedure: RECTAL EXAM UNDER ANESTHESIA;  Surgeon: Romie Levee, MD;  Location: [facility];  Service: General;  Laterality: N/A;  . ROUX-EN-Y GASTRIC BYPASS  09/20/2019   in [place]   . TREATMENT FISTULA ANAL      Allergies: Patient has no known allergies.  Medications: Prior to Admission medications   Medication Sig Start Date End Date Taking? Authorizing Provider  hyoscyamine (LEVSIN SL) 0.125 MG SL tablet Place 0.125 mg under the tongue every 6 (six) hours. 09/19/19  Yes [provider]  ibuprofen (ADVIL) 100 MG/5ML suspension Take 30 mLs by mouth every 6 (six) hours as needed for pain. 09/19/19  Yes [provider]  ondansetron (ZOFRAN-ODT) 4 MG disintegrating tablet Take 4 mg by mouth every 6 (six) hours as needed for nausea/vomiting. 09/19/19  Yes [provider]  pantoprazole (PROTONIX) 40 MG tablet Take 40 mg by mouth daily. 09/19/19  Yes [provider]  cephALEXin (KEFLEX) 500 MG  capsule Take 1 capsule (500 mg total) by mouth 3 (three) times daily. Patient not taking: Reported on 09/22/2019 02/23/14   Vivi Barrack, DPM  HYDROcodone-acetaminophen (NORCO/VICODIN) 5-325 MG  tablet Take 1-2 tablets by mouth every 4 (four) hours as needed. Patient not taking: Reported on 09/22/2019 12/26/15   Romie Levee, MD  oxyCODONE-acetaminophen (PERCOCET/ROXICET) 5-325 MG tablet Take 1 tablet by mouth every 8 (eight) hours as needed for pain. 09/19/19   [provider]  PROMETHEGAN 25 MG suppository Place 25 mg rectally every 12 (twelve) hours as needed for nausea/vomiting. 09/19/19   [provider]     History reviewed. No pertinent family history.  Social History   Socioeconomic History  . Marital status: Married    Spouse name: Not on file  . Number of children: Not on file  . Years of education: Not on file  . Highest education level: Not on file  Occupational History  . Not on file  Tobacco Use  . Smoking status: Former Smoker    Types: Cigarettes    Quit date: 12/22/2008    Years since quitting: 10.7  . Smokeless tobacco: Never Used  Substance and Sexual Activity  . Alcohol use: No  . Drug use: Not on file  . Sexual activity: Not on file  Other Topics Concern  . Not on file  Social History Narrative   ** Merged History Encounter **       Social Determinants of Health   Financial Resource Strain:   . Difficulty of Paying Living Expenses:   Food Insecurity:   . Worried About Programme researcher, broadcasting/film/video in the Last Year:   . Barista in the Last Year:   Transportation Needs:   . Freight forwarder (Medical):   Marland Kitchen Lack of Transportation (Non-Medical):   Physical Activity:   . Days of Exercise per Week:   . Minutes of Exercise per Session:   Stress:   . Feeling of Stress :   Social Connections:   . Frequency of Communication with Friends and Family:   . Frequency of Social Gatherings with Friends and Family:   . Attends Religious Services:   . Active Member of Clubs or Organizations:   . Attends Banker Meetings:   Marland Kitchen Marital Status:      Review of Systems: A 12 point ROS discussed and pertinent positives  are indicated in the HPI above.  All other systems are negative.  Review of Systems  Constitutional: Positive for fatigue. Negative for fever.  Respiratory: Negative for cough and shortness of breath.   Cardiovascular: Negative for chest pain.  Gastrointestinal: Positive for abdominal distention, abdominal pain and nausea.  Genitourinary: Positive for dysuria.  Musculoskeletal: Negative for back pain.  Psychiatric/Behavioral: Negative for behavioral problems and confusion.    Vital Signs: BP (!) 139/81 (BP Location: Left Arm)   Pulse 92   Temp 98.3 F (36.8 C) (Oral)   Resp 17   Ht  (1.778 m)   Wt 290 lb (131.5 kg)   SpO2 97%   BMI 41.61 kg/m   Physical Exam Vitals and nursing note reviewed.  Constitutional:      Appearance: He is normal weight.  Cardiovascular:     Rate and Rhythm: Normal rate and regular rhythm.  Pulmonary:     Effort: Pulmonary effort is normal. No respiratory distress.     Breath sounds: Normal breath sounds.  Abdominal:     Tenderness: There is abdominal  tenderness.     Comments: LUQ surgical drain in place with purulent output RUQ surgical drain with thin output.   Skin:    General: Skin is warm and dry.  Neurological:     General: No focal deficit present.     Mental Status: He is alert and oriented to person, place, and time.  Psychiatric:        Mood and Affect: Mood normal.        Behavior: Behavior normal.      MD Evaluation Airway: WNL Heart: WNL Abdomen: WNL Chest/ Lungs: WNL ASA  Classification: 3 Mallampati/Airway Score: Two   Imaging: DG Chest 2 View  Result Date: 09/22/2019 CLINICAL DATA:  44 year old male with history of epigastric pain. Suspected sepsis. EXAM: CHEST - 2 VIEW COMPARISON:  No priors. FINDINGS: Low lung volumes. Atelectasis and/or consolidation in the medial aspect of the left lower lobe. Right lung is clear. Trace bilateral pleural effusions. No evidence of pulmonary edema. No pneumothorax. No  suspicious appearing pulmonary nodules or masses are noted. Heart size is normal. Upper mediastinal contours are within normal limits. IMPRESSION: 1. Low lung volumes with atelectasis and/or consolidation in the medial aspect of the left lower lobe. 2. Trace bilateral pleural effusions. Electronically Signed   By: Trudie Reed M.D.   On: 09/22/2019 07:29   CT Angio Chest PE W/Cm &/Or Wo Cm  Result Date: 09/22/2019 CLINICAL DATA:  44 year old male with history of abdominal pain and fever. Status post gastric bypass procedure 2 days ago. Shortness of breath. EXAM: CT ANGIOGRAPHY CHEST CT ABDOMEN AND PELVIS WITH CONTRAST TECHNIQUE: Multidetector CT imaging of the chest was performed using the standard protocol during bolus administration of intravenous contrast. Multiplanar CT image reconstructions and MIPs were obtained to evaluate the vascular anatomy. Multidetector CT imaging of the abdomen and pelvis was performed using the standard protocol during bolus administration of intravenous contrast. CONTRAST:  OMNIPAQUE IOHEXOL 350 MG/ML SOLN COMPARISON:  None. FINDINGS: CTA CHEST FINDINGS Comment: Poor quality examination secondary to a combination of suboptimal contrast bolus and extensive patient respiratory motion. Cardiovascular: No central, lobar or proximal segmental sized filling defects are noted to suggest pulmonary embolism. Distal segmental and subsegmental sized emboli cannot be entirely excluded secondary to extensive patient respiratory motion. Heart size is normal. There is no significant pericardial fluid, thickening or pericardial calcification. No atherosclerotic calcifications in the thoracic aorta or the coronary arteries. Mediastinum/Nodes: No pathologically enlarged mediastinal or hilar lymph nodes. Esophagus is unremarkable in appearance. No axillary lymphadenopathy. Lungs/Pleura: Dependent opacities and volume loss in the lower lobes of the lungs bilaterally, compatible with a  combination of airspace consolidation and atelectasis, suspicious for sequela of recent aspiration. Trace left pleural effusion. Musculoskeletal: There are no aggressive appearing lytic or blastic lesions noted in the visualized portions of the skeleton. Review of the MIP images confirms the above findings. CT ABDOMEN and PELVIS FINDINGS Hepatobiliary: Diffuse low attenuation throughout the hepatic parenchyma, indicative of hepatic steatosis. No cystic or solid hepatic lesions. No intra or extrahepatic biliary ductal dilatation. Gallbladder is normal in appearance. Pancreas: No pancreatic mass. No pancreatic ductal dilatation. No pancreatic or peripancreatic fluid collections or inflammatory changes. Spleen: Unremarkable. Adrenals/Urinary Tract: Bilateral kidneys and adrenal glands are normal in appearance. No hydroureteronephrosis. Urinary bladder is normal in appearance. 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 Intramural gas along the anterior aspect of the gastric antrum best appreciated on axial image 29 of series 2. Small to moderate amount of pneumoperitoneum, particularly  in the upper abdomen adjacent to the surgical site. Some mildly dilated loops of jejunum are noted, presumably mild postoperative ileus. No other pathologic dilatation of small bowel or colon. A few scattered colonic diverticuli are evident, without surrounding inflammatory changes to clearly indicate an acute diverticulitis at this time. Normal appendix. Vascular/Lymphatic: No significant atherosclerotic disease, aneurysm or dissection noted in the abdominal or pelvic vasculature. No lymphadenopathy noted in the abdomen or pelvis. Reproductive: Prostate gland and seminal vesicles are unremarkable in appearance. Other: Small to moderate amount of pneumoperitoneum, presumably related to recent surgery. Trace volume of ascites. Musculoskeletal: There are no aggressive appearing lytic or blastic lesions noted in  the visualized portions of the skeleton. Review of the MIP images confirms the above findings. IMPRESSION: 1. Limited study demonstrating no central, lobar or proximal segmental sized pulmonary embolism. 2. The appearance of the dependent portions of the lungs suggest sequela of aspiration with a combination of atelectasis and airspace consolidation bilaterally in the lower lobes. 3. 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 There is also a small volume of ascites. 4. Severe hepatic steatosis. 5. Mild colonic diverticulosis without evidence to suggest an acute diverticulitis at this time. Electronically Signed   By: Trudie Reed M.D.   On: 09/22/2019 10:44   CT ABDOMEN PELVIS W CONTRAST  Result Date: 09/29/2019 CLINICAL DATA:  Status post Roux-en-Y bypass. Follow-up intra-abdominal abscesses. EXAM: CT ABDOMEN AND PELVIS WITH CONTRAST TECHNIQUE: Multidetector CT imaging of the abdomen and pelvis was performed using the standard protocol following bolus administration of intravenous contrast. CONTRAST:  OMNIPAQUE IOHEXOL 300 MG/ML  SOLN COMPARISON:  09/22/2019 FINDINGS: Lower chest: No pleural effusion. Similar appearance of bilateral lower lobe airspace consolidations and or atelectasis. Hepatobiliary: No focal liver abnormality. Gallbladder is unremarkable. No bile duct dilatation. Pancreas: Unremarkable. No pancreatic ductal dilatation or surrounding inflammatory changes. Spleen: Normal in size without focal abnormality. Adrenals/Urinary Tract: Normal adrenal glands. The kidneys are unremarkable. No kidney stone or hydronephrosis. Urinary bladder is normal. Stomach/Bowel: Postoperative changes from gastric bypass surgery identified. Signs of suture line dehiscence with leak at the gastrojejunostomy site noted, image  25/3 and image 41/5. Here, there is an adjacent collection of gas as well as enteric contrast material which extends from the suture line to the undersurface of the lateral segment of left lobe of liver, image 40/5. This appears similar to the exam from 09/22/2019. The remaining enteric contrast material passes into the jejunum up to the level of the ileocecal valve. Contrast opacified jejunal bowel loops appear increased in caliber measuring up to 3.5 cm concerning for paralytic ileus the colon appears decompressed. Vascular/Lymphatic: Normal appearance of the abdominal aorta. No abdominopelvic adenopathy identified. The portal vein and splenic vein are all patent. Reproductive: Prostate is unremarkable. Other: 2 percutaneous surgical drainage catheters are identified within the upper abdomen. The catheter which enters through the right upper quadrant ventral abdominal wallextends along the undersurface of the left hepatic lobe where the aforementioned gas and contrast collection situated. The second catheter which enters from a left upper quadrant approach does not appear associated with any fluid collection. There is a dominant fluid collection involving the lower abdomen and pelvis. This measures 13.5 x 11.2 by 14.3 cm, image 81/3 and image 56/5. Smaller fluid collection is identified extending along the undersurface of the right hepatic lobe measuring  15.6 cm in maximum dimension, best seen on the sagittal image 26/6. Smaller scattered interloop fluid collections are identified within the remaining portions of the abdomen and pelvis. Musculoskeletal: No acute or significant osseous findings. IMPRESSION: 1. Postoperative changes from gastric bypass surgery. Signs of suture line dehiscence with leak at the gastrojejunostomy site. Similar to the exam from 09/22/2019. 2. Persistent collection of gas as well as enteric contrast material which extends from the GJ suture line to the undersurface of the lateral segment  of left lobe of liver. 3. Interval development of large fluid collection within the lower abdomen and pelvis. A second large fluid collection extends along the undersurface of the right hepatic lobe. Additional small interloop fluid collections are present elsewhere in the abdomen and pelvis. 4. Contrast opacified jejunal bowel loops appear increased in caliber concerning for paralytic ileus. 5. Similar appearance of bilateral lower lobe airspace consolidations and or atelectasis. These results were called by telephone at the time of interpretation on 09/29/2019 at 1:53 pm to provider [facility] , who verbally acknowledged these results. Electronically Signed   By: Signa Kell M.D.   On: 09/29/2019 13:53   CT Abdomen Pelvis W Contrast  Result Date: 09/22/2019 CLINICAL DATA:  44 year old male with history of abdominal pain and fever. Status post gastric bypass procedure 2 days ago. Shortness of breath. EXAM: CT ANGIOGRAPHY CHEST CT ABDOMEN AND PELVIS WITH CONTRAST TECHNIQUE: Multidetector CT imaging of the chest was performed using the standard protocol during bolus administration of intravenous contrast. Multiplanar CT image reconstructions and MIPs were obtained to evaluate the vascular anatomy. Multidetector CT imaging of the abdomen and pelvis was performed using the standard protocol during bolus administration of intravenous contrast. CONTRAST:  OMNIPAQUE IOHEXOL 350 MG/ML SOLN COMPARISON:  None. FINDINGS: CTA CHEST FINDINGS Comment: Poor quality examination secondary to a combination of suboptimal contrast bolus and extensive patient respiratory motion. Cardiovascular: No central, lobar or proximal segmental sized filling defects are noted to suggest pulmonary embolism. Distal segmental and subsegmental sized emboli cannot be entirely excluded secondary to extensive patient respiratory motion. Heart size is normal. There is no significant pericardial fluid, thickening or pericardial  calcification. No atherosclerotic calcifications in the thoracic aorta or the coronary arteries. Mediastinum/Nodes: No pathologically enlarged mediastinal or hilar lymph nodes. Esophagus is unremarkable in appearance. No axillary lymphadenopathy. Lungs/Pleura: Dependent opacities and volume loss in the lower lobes of the lungs bilaterally, compatible with a combination of airspace consolidation and atelectasis, suspicious for sequela of recent aspiration. Trace left pleural effusion. Musculoskeletal: There are no aggressive appearing lytic or blastic lesions noted in the visualized portions of the skeleton. Review of the MIP images confirms the above findings. CT ABDOMEN and PELVIS FINDINGS Hepatobiliary: Diffuse low attenuation throughout the hepatic parenchyma, indicative of hepatic steatosis. No cystic or solid hepatic lesions. No intra or extrahepatic biliary ductal dilatation. Gallbladder is normal in appearance. Pancreas: No pancreatic mass. No pancreatic ductal dilatation. No pancreatic or peripancreatic fluid collections or inflammatory changes. Spleen: Unremarkable. Adrenals/Urinary Tract: Bilateral kidneys and adrenal glands are normal in appearance. No hydroureteronephrosis. Urinary bladder is normal in appearance. 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 Intramural gas along the anterior aspect of the gastric antrum best appreciated on axial image 29 of series 2. Small to moderate amount of pneumoperitoneum, particularly in the upper abdomen adjacent to the surgical site. Some mildly dilated loops of jejunum are noted, presumably mild postoperative ileus. No other pathologic dilatation of small bowel or colon. A few scattered  colonic diverticuli are evident, without surrounding inflammatory changes to clearly indicate an acute diverticulitis at this time. Normal appendix. Vascular/Lymphatic: No significant atherosclerotic disease, aneurysm or dissection noted in the  abdominal or pelvic vasculature. No lymphadenopathy noted in the abdomen or pelvis. Reproductive: Prostate gland and seminal vesicles are unremarkable in appearance. Other: Small to moderate amount of pneumoperitoneum, presumably related to recent surgery. Trace volume of ascites. Musculoskeletal: There are no aggressive appearing lytic or blastic lesions noted in the visualized portions of the skeleton. Review of the MIP images confirms the above findings. IMPRESSION: 1. Limited study demonstrating no central, lobar or proximal segmental sized pulmonary embolism. 2. The appearance of the dependent portions of the lungs suggest sequela of aspiration with a combination of atelectasis and airspace consolidation bilaterally in the lower lobes. 3. 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 There is also a small volume of ascites. 4. Severe hepatic steatosis. 5. Mild colonic diverticulosis without evidence to suggest an acute diverticulitis at this time. Electronically Signed   By: Trudie Reedaniel  Entrikin M.D.   On: 09/22/2019 10:44   DG Chest Port 1 View  Result Date: 09/25/2019 CLINICAL DATA:  Acute respiratory failure with hypoxemia. EXAM: PORTABLE CHEST 1 VIEW COMPARISON:  09/22/2019 FINDINGS: Stable mild cardiac enlargement. Very low lung volumes with vascular crowding and atelectasis. Progressive central vascular congestion without overt pulmonary edema. No definite pleural effusions. Right PICC line tip in good position in the distal SVC. IMPRESSION: 1. Very low lung volumes with vascular crowding and atelectasis. 2. Progressive central vascular congestion without overt pulmonary edema. Electronically Signed   By: Rudie MeyerP.  Gallerani M.D.   On: 09/25/2019 06:43   US EKG SITE RITE  Result Date: 09/24/2019 If Site Rite image not  attached, placement could not be confirmed due to current cardiac rhythm.   Labs:  CBC: Recent Labs    09/26/19 0649 09/27/19 0600 09/28/19 0406 09/29/19 0425  WBC 21.4* 16.3* 18.9* 18.7*  HGB 11.8* 10.9* 10.6* 10.8*  HCT 36.4* 34.5* 33.4* 34.6*  PLT 246 224 258 306    COAGS: Recent Labs    09/22/19 0623 09/29/19 1356  INR 1.1 1.0    BMP: Recent Labs    09/26/19 0649 09/27/19 0600 09/28/19 0406 09/29/19 0425  NA 136 139 136 136  K 4.0 3.9 3.6 3.7  CL 97* 98 101 100  CO2 28 29 26 25   GLUCOSE 239* 190* 142* 164*  BUN 17 16 15 16   CALCIUM 8.1* 8.0* 7.9* 8.1*  CREATININE 0.87 0.79 0.89 0.77  GFRNONAA >60 >60 >60 >60  GFRAA >60 >60 >60 >60    LIVER FUNCTION TESTS: Recent Labs    09/25/19 0230 09/26/19 0649 09/27/19 0600 09/28/19 0406  BILITOT 1.6* 1.2 1.5* 1.4*  AST 15 17 35 38  ALT 26 26 41 54*  ALKPHOS 83 119 186* 222*  PROT 6.1* 6.3* 5.8* 6.2*  ALBUMIN 2.3* 2.3* 2.0* 2.2*    TUMOR MARKERS: No results for input(s): AFPTM, CEA, CA199, CHROMGRNA in the last 8760 hours.  Assessment and Plan: Intra-abdominal fluid collection s/p anastomotic leak  CT today shows pelvic fluid collection.  IR consulted for drainage.  Case reviewed and approved by Dr. Loreta AveWagner.  He has been NPO.   Risks and benefits discussed with the patient including bleeding, infection, damage to adjacent structures, bowel perforation/fistula connection,  and sepsis.  All of the patient's questions were answered, patient is agreeable to proceed. Consent signed and in chart.  Thank you for this interesting consult.  I greatly enjoyed meeting Brad Hughes and look forward to participating in their care.  A copy of this report was sent to the requesting provider on this date.  Electronically Signed: Hoyt Koch, PA 09/29/2019, 3:34 PM   I spent a total of 40 Minutes    in face to face in clinical consultation, greater than 50% of which was counseling/coordinating  care for intra-abdominal fluid collection.

## 2019-09-29 NOTE — Progress Notes (Addendum)
6 Days Post-Op  Subjective:  No new complaints.  Voided about 2700 yesterday after lasix.  Pain well controlled.  Ambulating better.  Had a BM yesterday.  No nausea  ROS: See above, otherwise other systems negative  Objective: Vital signs in last 24 hours: Temp:  [98.3 F (36.8 C)-99.7 F (37.6 C)] 98.3 F (36.8 C) (07/23 0620) Pulse Rate:  [95-110] 95 (07/23 0620) Resp:  [17-18] 18 (07/23 0620) BP: (120-137)/(69-78) 122/72 (07/23 0620) SpO2:  [95 %-98 %] 97 % (07/23 0620) Last BM Date: 09/28/19  Intake/Output from previous day: 07/22 0701 - 07/23 0700 In: 1534.1 [I.V.:1076.7; IV Piggyback:457.4] Out: 3450 [Urine:3400; Drains:50] Intake/Output this shift: No intake/output data recorded.  PE: Heart: regular Lungs: CTAB Abd: soft, appropriately tender, incisions c/d/i.  JP on right with scant bilious output. JP on right with scant purulent output, both just emptied, but outputs down significantly from the last 2 days.  Lab Results:  Recent Labs    09/28/19 0406 09/29/19 0425  WBC 18.9* 18.7*  HGB 10.6* 10.8*  HCT 33.4* 34.6*  PLT 258 306   BMET Recent Labs    09/28/19 0406 09/29/19 0425  NA 136 136  K 3.6 3.7  CL 101 100  CO2 26 25  GLUCOSE 142* 164*  BUN 15 16  CREATININE 0.89 0.77  CALCIUM 7.9* 8.1*   PT/INR No results for input(s): LABPROT, INR in the last 72 hours. CMP     Component Value Date/Time   NA 136 09/29/2019 0425   K 3.7 09/29/2019 0425   CL 100 09/29/2019 0425   CO2 25 09/29/2019 0425   GLUCOSE 164 (H) 09/29/2019 0425   BUN 16 09/29/2019 0425   CREATININE 0.77 09/29/2019 0425   CALCIUM 8.1 (L) 09/29/2019 0425   PROT 6.2 (L) 09/28/2019 0406   ALBUMIN 2.2 (L) 09/28/2019 0406   AST 38 09/28/2019 0406   ALT 54 (H) 09/28/2019 0406   ALKPHOS 222 (H) 09/28/2019 0406   BILITOT 1.4 (H) 09/28/2019 0406   GFRNONAA >60 09/29/2019 0425   GFRAA >60 09/29/2019 0425   Lipase     Component Value Date/Time   LIPASE 31 09/22/2019 0623        Studies/Results: No results found.  Anti-infectives: Anti-infectives (From admission, onward)   Start     Dose/Rate Route Frequency Ordered Stop   09/27/19 1000  anidulafungin (ERAXIS) 100 mg in sodium chloride 0.9 % 100 mL IVPB     Discontinue     100 mg 78 mL/hr over 100 Minutes Intravenous Every 24 hours 09/26/19 0819     09/26/19 1000  anidulafungin (ERAXIS) 200 mg in sodium chloride 0.9 % 200 mL IVPB        200 mg 78 mL/hr over 200 Minutes Intravenous  Once 09/26/19 0819 09/26/19 1334   09/24/19 0730  piperacillin-tazobactam (ZOSYN) IVPB 3.375 g     Discontinue    Note to Pharmacy: Pharmacy to check dosing   3.375 g 12.5 mL/hr over 240 Minutes Intravenous Every 8 hours 09/24/19 0722     09/24/19 0600  cefoTEtan (CEFOTAN) 2 g in sodium chloride 0.9 % 100 mL IVPB        2 g 200 mL/hr over 30 Minutes Intravenous On call to O.R. 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 Laureen Ochs- 7/17 -persistent leak at GJ site. Leak appeared to be about 5mm in size with some necrotic and edematous/inflammatory changes noted around this site. -cont JP drains for now to control leak.  One of the drains is putting out the most and today it has turned from succus to purulent, foul smelling drainage. -cont strict NPO/TNA for artificial nutrition since g-tube not placed -at this point, the leak is going to be difficult to repair and so therefore, conservative management with NPO/drains is likely going to be the treatment  pending he does not clinically decline -mobilize,PT no HH needs, but will benefit from skilled PT while here -pulm toilet/IS, needs to really work on deep breathing. O2 off currently -cont zosyn, eraxis, WBC 18K, stable.  CT today pending.  FEN -NPO/IVFs/TNA VTE -lovenox ID -[id number] -->, Eraxis 7/20 -->   LOS: 6 days    Letha Cape , [facility] Surgery 09/29/2019, 8:35 AM Please see Amion for pager number during day hours 7:00am-4:30pm or 7:00am -11:30am on weekends  Agree with above. Looks pretty good. For CT scan of abdomen today.  Ovidio Kin, MD, [facility] Surgery Office phone:  [phone]

## 2019-09-29 NOTE — Procedures (Signed)
Interventional Radiology Procedure Note  Procedure: Image guided drain placement, left pelvis.  32F pigtail drain.  ~600cc of maroon serosanguinous think fluid.   Complications: None  EBL: None Sample: Culture sent  Recommendations: - Routine drain care, with sterile flushes, record output - follow up Cx - routine wound care  Signed,  Yvone Neu. Loreta Ave, DO

## 2019-09-30 LAB — CBC
HCT: 34.6 % — ABNORMAL LOW (ref 39.0–52.0)
Hemoglobin: 10.8 g/dL — ABNORMAL LOW (ref 13.0–17.0)
MCH: 28.6 pg (ref 26.0–34.0)
MCHC: 31.2 g/dL (ref 30.0–36.0)
MCV: 91.8 fL (ref 80.0–100.0)
Platelets: 366 10*3/uL (ref 150–400)
RBC: 3.77 MIL/uL — ABNORMAL LOW (ref 4.22–5.81)
RDW: 13.3 % (ref 11.5–15.5)
WBC: 16 10*3/uL — ABNORMAL HIGH (ref 4.0–10.5)
nRBC: 0 % (ref 0.0–0.2)

## 2019-09-30 LAB — BASIC METABOLIC PANEL
Anion gap: 9 (ref 5–15)
BUN: 14 mg/dL (ref 6–20)
CO2: 25 mmol/L (ref 22–32)
Calcium: 8 mg/dL — ABNORMAL LOW (ref 8.9–10.3)
Chloride: 99 mmol/L (ref 98–111)
Creatinine, Ser: 0.85 mg/dL (ref 0.61–1.24)
GFR calc Af Amer: >60 mL/min (ref >60)
GFR calc non Af Amer: >60 mL/min (ref >60)
Glucose, Bld: 149 mg/dL — ABNORMAL HIGH (ref 70–99)
Potassium: 4.1 mmol/L (ref 3.5–5.1)
Sodium: 133 mmol/L — ABNORMAL LOW (ref 135–145)

## 2019-09-30 LAB — GLUCOSE, CAPILLARY
Glucose-Capillary: 130 mg/dL — ABNORMAL HIGH (ref 70–99)
Glucose-Capillary: 136 mg/dL — ABNORMAL HIGH (ref 70–99)
Glucose-Capillary: 140 mg/dL — ABNORMAL HIGH (ref 70–99)
Glucose-Capillary: 145 mg/dL — ABNORMAL HIGH (ref 70–99)

## 2019-09-30 LAB — PHOSPHORUS: Phosphorus: 3.8 mg/dL (ref 2.5–4.6)

## 2019-09-30 LAB — TRIGLYCERIDES: Triglycerides: 237 mg/dL — ABNORMAL HIGH (ref <150)

## 2019-09-30 LAB — MAGNESIUM: Magnesium: 2.4 mg/dL (ref 1.7–2.4)

## 2019-09-30 MED ORDER — TRAVASOL 10 % IV SOLN
INTRAVENOUS | Status: AC
  Filled 2019-09-30: qty 1200

## 2019-09-30 NOTE — Progress Notes (Signed)
7 Days Post-Op   Subjective/Chief Complaint: No complaints other than mild soreness   Objective: Vital signs in last 24 hours: Temp:  [98 F (36.7 C)-98.5 F (36.9 C)] 98 F (36.7 C) (07/24 0602) Pulse Rate:  [83-99] 92 (07/24 0602) Resp:  [16-32] 16 (07/24 0602) BP: (103-139)/(48-86) 132/78 (07/24 0602) SpO2:  [94 %-100 %] 98 % (07/24 0602) Last BM Date: 09/28/19  Intake/Output from previous day: 07/23 0701 - 07/24 0700 In: 4667.8 [I.V.:4318.7; IV Piggyback:349.1] Out: 3615 [Urine:2850; Drains:765] Intake/Output this shift: No intake/output data recorded.  General appearance: alert and cooperative Resp: clear to auscultation bilaterally Cardio: regular rate and rhythm GI: soft, mild diffuse tenderness. incisions ok. drain output purulent  Lab Results:  Recent Labs    09/29/19 0425 09/30/19 0400  WBC 18.7* 16.0*  HGB 10.8* 10.8*  HCT 34.6* 34.6*  PLT 306 366   BMET Recent Labs    09/29/19 0425 09/30/19 0400  NA 136 133*  K 3.7 4.1  CL 100 99  CO2 25 25  GLUCOSE 164* 149*  BUN 16 14  CREATININE 0.77 0.85  CALCIUM 8.1* 8.0*   PT/INR Recent Labs    09/29/19 1356  LABPROT 13.0  INR 1.0   ABG No results for input(s): PHART, HCO3 in the last 72 hours.  Invalid input(s): PCO2, PO2  Studies/Results: CT ABDOMEN PELVIS W CONTRAST  Result Date: 09/29/2019 CLINICAL DATA:  Status post Roux-en-Y bypass. Follow-up intra-abdominal abscesses. EXAM: CT ABDOMEN AND PELVIS WITH CONTRAST TECHNIQUE: Multidetector CT imaging of the abdomen and pelvis was performed using the standard protocol following bolus administration of intravenous contrast. CONTRAST:  OMNIPAQUE IOHEXOL 300 MG/ML  SOLN COMPARISON:  09/22/2019 FINDINGS: Lower chest: No pleural effusion. Similar appearance of bilateral lower lobe airspace consolidations and or atelectasis. Hepatobiliary: No focal liver abnormality. Gallbladder is unremarkable. No bile duct dilatation. Pancreas: Unremarkable. No  pancreatic ductal dilatation or surrounding inflammatory changes. Spleen: Normal in size without focal abnormality. Adrenals/Urinary Tract: Normal adrenal glands. The kidneys are unremarkable. No kidney stone or hydronephrosis. Urinary bladder is normal. Stomach/Bowel: Postoperative changes from gastric bypass surgery identified. Signs of suture line dehiscence with leak at the gastrojejunostomy site noted, image 25/3 and image 41/5. Here, there is an adjacent collection of gas as well as enteric contrast material which extends from the suture line to the undersurface of the lateral segment of left lobe of liver, image 40/5. This appears similar to the exam from 09/22/2019. The remaining enteric contrast material passes into the jejunum up to the level of the ileocecal valve. Contrast opacified jejunal bowel loops appear increased in caliber measuring up to 3.5 cm concerning for paralytic ileus the colon appears decompressed. Vascular/Lymphatic: Normal appearance of the abdominal aorta. No abdominopelvic adenopathy identified. The portal vein and splenic vein are all patent. Reproductive: Prostate is unremarkable. Other: 2 percutaneous surgical drainage catheters are identified within the upper abdomen. The catheter which enters through the right upper quadrant ventral abdominal wallextends along the undersurface of the left hepatic lobe where the aforementioned gas and contrast collection situated. The second catheter which enters from a left upper quadrant approach does not appear associated with any fluid collection. There is a dominant fluid collection involving the lower abdomen and pelvis. This measures 13.5 x 11.2 by 14.3 cm, image 81/3 and image 56/5. Smaller fluid collection is identified extending along the undersurface of the right hepatic lobe measuring 15.6 cm in maximum dimension, best seen on the sagittal image 26/6. Smaller scattered interloop fluid collections  are identified within the remaining  portions of the abdomen and pelvis. Musculoskeletal: No acute or significant osseous findings. IMPRESSION: 1. Postoperative changes from gastric bypass surgery. Signs of suture line dehiscence with leak at the gastrojejunostomy site. Similar to the exam from 09/22/2019. 2. Persistent collection of gas as well as enteric contrast material which extends from the GJ suture line to the undersurface of the lateral segment of left lobe of liver. 3. Interval development of large fluid collection within the lower abdomen and pelvis. A second large fluid collection extends along the undersurface of the right hepatic lobe. Additional small interloop fluid collections are present elsewhere in the abdomen and pelvis. 4. Contrast opacified jejunal bowel loops appear increased in caliber concerning for paralytic ileus. 5. Similar appearance of bilateral lower lobe airspace consolidations and or atelectasis. These results were called by telephone at the time of interpretation on 09/29/2019 at 1:53 pm to provider [facility] , who verbally acknowledged these results. Electronically Signed   By: Signa Kell M.D.   On: 09/29/2019 13:53   CT IMAGE GUIDED DRAINAGE BY PERCUTANEOUS CATHETER  Result Date: 09/29/2019 INDICATION: 44 year old male with postoperative fluid collection. EXAM: CT-GUIDED DRAINAGE OF PELVIC FLUID COLLECTION MEDICATIONS: The patient is currently admitted to the hospital and receiving intravenous antibiotics. The antibiotics were administered within an appropriate time frame prior to the initiation of the procedure. ANESTHESIA/SEDATION: 2.0 mg IV Versed 200 mcg IV Fentanyl Moderate Sedation Time:  15 minutes The patient was continuously monitored during the procedure by the interventional radiology nurse under my direct supervision. COMPLICATIONS: None TECHNIQUE: Informed written consent was obtained from the patient after a thorough discussion of the procedural risks, benefits and alternatives. All  questions were addressed. Maximal Sterile Barrier Technique was utilized including caps, mask, sterile gowns, sterile gloves, sterile drape, hand hygiene and skin antiseptic. A timeout was performed prior to the initiation of the procedure. PROCEDURE: The left pelvis was prepped with chlorhexidine in a sterile fashion, and a sterile drape was applied covering the operative field. A sterile gown and sterile gloves were used for the procedure. Local anesthesia was provided with 1% Lidocaine. Using CT guidance, trocar needle was advanced into the fluid collection within the pelvis from a left pelvic approach. Modified Seldinger technique was used to place a 12 [place] drain. We then evacuated approximately 600 cc of thin serosanguineous dark maroon fluid. Culture was sent. Final image was stored. The catheter was attached to gravity drainage. Patient tolerated the procedure well and remained hemodynamically stable throughout. No complications were encountered and no significant blood loss. FINDINGS: Fluid collection within the pelvis, similar in appearance to the comparison CT. Pigtail catheter placed within the pelvic collection via left pelvic approach. IMPRESSION: Status post CT guided drainage of pelvic fluid collection. Signed, Yvone Neu. Reyne Dumas, RPVI [facility] Radiology Electronically Signed   By: Gilmer Mor D.O.   On: 09/29/2019 17:14    Anti-infectives: Anti-infectives (From admission, onward)   Start     Dose/Rate Route Frequency Ordered Stop   09/27/19 1000  anidulafungin (ERAXIS) 100 mg in sodium chloride 0.9 % 100 mL IVPB     Discontinue     100 mg 78 mL/hr over 100 Minutes Intravenous Every 24 hours 09/26/19 0819     09/26/19 1000  anidulafungin (ERAXIS) 200 mg in sodium chloride 0.9 % 200 mL IVPB        200 mg 78 mL/hr over 200 Minutes Intravenous  Once 09/26/19 0819 09/26/19  1334   09/24/19 0730  piperacillin-tazobactam (ZOSYN) IVPB 3.375  g     Discontinue    Note to Pharmacy: Pharmacy to check dosing   3.375 g 12.5 mL/hr over 240 Minutes Intravenous Every 8 hours 09/24/19 0722     09/24/19 0600  cefoTEtan (CEFOTAN) 2 g in sodium chloride 0.9 % 100 mL IVPB        2 g 200 mL/hr over 30 Minutes Intravenous On call to O.R. 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 Laureen Ochs- 7/17 -persistent leak at GJ site. Leak appeared to be about 22mm in size with some necrotic and edematous/inflammatory changes noted around this site. -cont JP drains for now to control leak. One of the drains is putting out the most and today it has turned from succus to purulent, foul smelling drainage. -cont strict NPO/TNA for artificial nutrition since g-tube not placed -at this point, the leak is going to be difficult to repair and so therefore, conservative management with NPO/drains is likely going to be the treatment pending he does not clinically decline -mobilize,PT no HH needs, but will benefit from skilled PT while here -pulm toilet/IS, needs to really work on deep breathing.O2 off currently -cont zosyn, eraxis, WBC16K, decreasing. CT showed large fluid collection and additional drain was placed by IR  FEN -NPO/IVFs/TNA VTE -lovenox ID -[id number] -->, Eraxis 7/20 -->  LOS: 7 days    Chevis Pretty  III 09/30/2019

## 2019-09-30 NOTE — Progress Notes (Signed)
PHARMACY - TOTAL PARENTERAL NUTRITION CONSULT NOTE   Indication: post-op bowel rest  Patient Measurements: Height: '5\' 10"'  (177.8 cm) Weight: 131.5 kg (290 lb) IBW/kg (Calculated) : 73 TPN AdjBW (KG): 87.6 Body mass index is 41.61 kg/m. Usual Weight: 131.5kg Adj body weight: ~ 100 kg  Assessment:  68 yoM admit 7/16 with post-op leak at [place] anastomosis (surgery 7/12), Expl Lap 7/17, drains placed - Peritonitis, on Zosyn 3.375gm q8h, 7/20 Eraxis added   Glucose / Insulin: no hx DM, A1c is 5.6, CBGs 131-164 (goal <150), used 5 units SSI in 24 hrs - TPN currently with 25 units of insulin in bag  Electrolytes: Na down 133, CorrCa 9.44; other lytes wnl Renal: SCr  WNL LFTs / TGs: LFTs slightly up with labs on 7/22, Alk phos up to 222 (on 7/22), T bili 1.4 (on 7/22); TG 398 >> 443> 319 >> 236>> 237  Prealbumin / albumin: prealbumin < 5 on 7/19 2nd inflammatory process;  Albumin 2.2 Intake / Output; MIVF:  I/O +1053 mL, 765 ml from drain, -  D5 & 1/2NS w/20 KCl at 25 ml/lhr GI Imaging: 7/16 Chest/Abd CT: no PE, possible aspiration/lungs, pneumoperitoneum/gas, ascites, severe hepatic steatosis, mild diverticulosis - no inflammation Surgeries / Procedures: 7/13 Roux-en-Y bypass in [place], was d/c same day. 7/17 Expl Lap at [facility] - repair anastomosis  Central access: PICC 7/18 TPN start date: 7/18  Nutritional Goals ( RD recommendations on 7/19 ): KCal: 8115-7262 , Protein: 115-130, Fluid: >/= 2.5 L/day Goal TPN rate is 100 mL/hr (provides 120  g of protein and 2388 kcals per day with lipids, 1908 kcals per day w/o lipids)  Current Nutrition:  NPO, aspiration precautions  Plan:   Continue TPN at goal rate  of 100 ml/hr. Continue with 50% of goal lipids for now.  Electrolytes in TPN: Increase to 100 mEq/L of Na, continue 78mq/L of K, 51m/L of Ca, 52m82mL of Mg, and change to 10 mmol/L of Phosphorus; Cl:Ac ratio 1:1  Standard MVI and trace elements to TPN  Continue  Sensitive q6h SSI and  adjust as needed   Continue insulin 30 units to TPN  Continue MIVF to 25 mL/hr or per CCS  CMET, magnesium, phosphorus, triglycerides on 7/25  Monitor TPN labs on Mon/Thurs  AnhDia SitterharmD, BCPS 09/30/2019 9:00 AM

## 2019-10-01 LAB — COMPREHENSIVE METABOLIC PANEL
ALT: 41 U/L (ref 0–44)
AST: 21 U/L (ref 15–41)
Albumin: 2.5 g/dL — ABNORMAL LOW (ref 3.5–5.0)
Alkaline Phosphatase: 266 U/L — ABNORMAL HIGH (ref 38–126)
Anion gap: 8 (ref 5–15)
BUN: 16 mg/dL (ref 6–20)
CO2: 26 mmol/L (ref 22–32)
Calcium: 8.1 mg/dL — ABNORMAL LOW (ref 8.9–10.3)
Chloride: 99 mmol/L (ref 98–111)
Creatinine, Ser: 0.9 mg/dL (ref 0.61–1.24)
GFR calc Af Amer: >60 mL/min (ref >60)
GFR calc non Af Amer: >60 mL/min (ref >60)
Glucose, Bld: 156 mg/dL — ABNORMAL HIGH (ref 70–99)
Potassium: 4.1 mmol/L (ref 3.5–5.1)
Sodium: 133 mmol/L — ABNORMAL LOW (ref 135–145)
Total Bilirubin: 1.1 mg/dL (ref 0.3–1.2)
Total Protein: 7.1 g/dL (ref 6.5–8.1)

## 2019-10-01 LAB — GLUCOSE, CAPILLARY
Glucose-Capillary: 123 mg/dL — ABNORMAL HIGH (ref 70–99)
Glucose-Capillary: 143 mg/dL — ABNORMAL HIGH (ref 70–99)
Glucose-Capillary: 148 mg/dL — ABNORMAL HIGH (ref 70–99)
Glucose-Capillary: 172 mg/dL — ABNORMAL HIGH (ref 70–99)

## 2019-10-01 LAB — CBC
HCT: 34.3 % — ABNORMAL LOW (ref 39.0–52.0)
Hemoglobin: 10.7 g/dL — ABNORMAL LOW (ref 13.0–17.0)
MCH: 28.2 pg (ref 26.0–34.0)
MCHC: 31.2 g/dL (ref 30.0–36.0)
MCV: 90.3 fL (ref 80.0–100.0)
Platelets: 404 10*3/uL — ABNORMAL HIGH (ref 150–400)
RBC: 3.8 MIL/uL — ABNORMAL LOW (ref 4.22–5.81)
RDW: 13.1 % (ref 11.5–15.5)
WBC: 15.1 10*3/uL — ABNORMAL HIGH (ref 4.0–10.5)
nRBC: 0 % (ref 0.0–0.2)

## 2019-10-01 LAB — TRIGLYCERIDES: Triglycerides: 243 mg/dL — ABNORMAL HIGH (ref <150)

## 2019-10-01 LAB — MAGNESIUM: Magnesium: 2.4 mg/dL (ref 1.7–2.4)

## 2019-10-01 LAB — PHOSPHORUS: Phosphorus: 4.1 mg/dL (ref 2.5–4.6)

## 2019-10-01 MED ORDER — TRAVASOL 10 % IV SOLN
INTRAVENOUS | Status: AC
  Filled 2019-10-01: qty 1200

## 2019-10-01 NOTE — Progress Notes (Signed)
PHARMACY - TOTAL PARENTERAL NUTRITION CONSULT NOTE   Indication: post-op bowel rest  Patient Measurements: Height: '5\' 10"'  (177.8 cm) Weight: 131.5 kg (290 lb) IBW/kg (Calculated) : 73 TPN AdjBW (KG): 87.6 Body mass index is 41.61 kg/m. Usual Weight: 131.5kg Adj body weight: ~ 100 kg  Assessment:  37 yoM admit 7/16 with post-op leak at [place] anastomosis (surgery 7/12), Expl Lap 7/17, drains placed - Peritonitis, on Zosyn 3.375gm q8h, 7/20 Eraxis added   Glucose / Insulin: no hx DM, A1c is 5.6, CBGs 130-156 (goal <150), used 4 units SSI in 24 hrs - TPN currently with 25 units of insulin in bag  Electrolytes: Na remains low at 133; other lytes wnl Renal: SCr  WNL LFTs / TGs: LFTs slightly up with labs on 7/22, Alk phos up to 222 (on 7/22), T bili 1.4 (on 7/22); TG 398 >> 443> 319 >> 236>> 237>> 243 Prealbumin / albumin: prealbumin < 5 on 7/19 2nd inflammatory process;  Albumin 2.5 Intake / Output; MIVF:  net +2041 mL, 205 ml from drain, -  D5 & 1/2NS w/20 KCl at 25 ml/lhr GI Imaging: 7/16 Chest/Abd CT: no PE, possible aspiration/lungs, pneumoperitoneum/gas, ascites, severe hepatic steatosis, mild diverticulosis - no inflammation Surgeries / Procedures: 7/13 [place]oux-en-Y bypass in [place], was d/c same day. 7/17 Expl Lap at [facility] - repair anastomosis  Central access: PICC 7/18 TPN start date: 7/18  Nutritional Goals ( RD recommendations on 7/19 ): KCal: 0712-5247 , Protein: 115-130, Fluid: >/= 2.5 L/day Goal TPN rate is 100 mL/hr (provides 120  g of protein and 2388 kcals per day with lipids, 1908 kcals per day w/o lipids)  Current Nutrition:  NPO, aspiration precautions  Plan:   Continue TPN at goal rate  of 100 ml/hr. Continue with 50% of goal lipids for now.  Electrolytes in TPN: Increase to 150 mEq/L of Na, continue 27mq/L of K, 546m/L of Ca, 23m57mL of Mg, and continue 10 mmol/L of Phosphorus; Cl:Ac ratio 1:1  Standard MVI and trace elements to TPN  Continue  Sensitive q6h SSI and  adjust as needed   Continue insulin 30 units to TPN  Continue MIVF to 25 mL/hr or per CCS  Monitor TPN labs on Mon/Thurs  AnhDia SitterharmD, BCPS 10/01/2019 8:38 AM

## 2019-10-01 NOTE — Progress Notes (Signed)
Paged Dr Carolynne Edouard for K-pad to help with pain. Received order via phone from MD.

## 2019-10-01 NOTE — Progress Notes (Signed)
8 Days Post-Op   Subjective/Chief Complaint: He thinks his belly feels a little better   Objective: Vital signs in last 24 hours: Temp:  [98.3 F (36.8 C)-98.7 F (37.1 C)] 98.3 F (36.8 C) (07/25 0544) Pulse Rate:  [89-98] 89 (07/25 0544) Resp:  [18-20] 18 (07/25 0544) BP: (120-131)/(75-104) 126/77 (07/25 0544) SpO2:  [97 %-99 %] 98 % (07/25 0544) Last BM Date: 09/28/19  Intake/Output from previous day: 07/24 0701 - 07/25 0700 In: 3571 [I.V.:3075; IV Piggyback:496] Out: 1530 [Urine:1325; Drains:205] Intake/Output this shift: No intake/output data recorded.  General appearance: alert and cooperative Resp: clear to auscultation bilaterally Cardio: regular rate and rhythm GI: soft, mild tenderness. drain output purulent  Lab Results:  Recent Labs    09/30/19 0400 10/01/19 0500  WBC 16.0* 15.1*  HGB 10.8* 10.7*  HCT 34.6* 34.3*  PLT 366 404*   BMET Recent Labs    09/30/19 0400 10/01/19 0500  NA 133* 133*  K 4.1 4.1  CL 99 99  CO2 25 26  GLUCOSE 149* 156*  BUN 14 16  CREATININE 0.85 0.90  CALCIUM 8.0* 8.1*   PT/INR Recent Labs    09/29/19 1356  LABPROT 13.0  INR 1.0   ABG No results for input(s): PHART, HCO3 in the last 72 hours.  Invalid input(s): PCO2, PO2  Studies/Results: CT ABDOMEN PELVIS W CONTRAST  Result Date: 09/29/2019 CLINICAL DATA:  Status post Roux-en-Y bypass. Follow-up intra-abdominal abscesses. EXAM: CT ABDOMEN AND PELVIS WITH CONTRAST TECHNIQUE: Multidetector CT imaging of the abdomen and pelvis was performed using the standard protocol following bolus administration of intravenous contrast. CONTRAST:  OMNIPAQUE IOHEXOL 300 MG/ML  SOLN COMPARISON:  09/22/2019 FINDINGS: Lower chest: No pleural effusion. Similar appearance of bilateral lower lobe airspace consolidations and or atelectasis. Hepatobiliary: No focal liver abnormality. Gallbladder is unremarkable. No bile duct dilatation. Pancreas: Unremarkable. No pancreatic ductal  dilatation or surrounding inflammatory changes. Spleen: Normal in size without focal abnormality. Adrenals/Urinary Tract: Normal adrenal glands. The kidneys are unremarkable. No kidney stone or hydronephrosis. Urinary bladder is normal. Stomach/Bowel: Postoperative changes from gastric bypass surgery identified. Signs of suture line dehiscence with leak at the gastrojejunostomy site noted, image 25/3 and image 41/5. Here, there is an adjacent collection of gas as well as enteric contrast material which extends from the suture line to the undersurface of the lateral segment of left lobe of liver, image 40/5. This appears similar to the exam from 09/22/2019. The remaining enteric contrast material passes into the jejunum up to the level of the ileocecal valve. Contrast opacified jejunal bowel loops appear increased in caliber measuring up to 3.5 cm concerning for paralytic ileus the colon appears decompressed. Vascular/Lymphatic: Normal appearance of the abdominal aorta. No abdominopelvic adenopathy identified. The portal vein and splenic vein are all patent. Reproductive: Prostate is unremarkable. Other: 2 percutaneous surgical drainage catheters are identified within the upper abdomen. The catheter which enters through the right upper quadrant ventral abdominal wallextends along the undersurface of the left hepatic lobe where the aforementioned gas and contrast collection situated. The second catheter which enters from a left upper quadrant approach does not appear associated with any fluid collection. There is a dominant fluid collection involving the lower abdomen and pelvis. This measures 13.5 x 11.2 by 14.3 cm, image 81/3 and image 56/5. Smaller fluid collection is identified extending along the undersurface of the right hepatic lobe measuring 15.6 cm in maximum dimension, best seen on the sagittal image 26/6. Smaller scattered interloop fluid collections are  identified within the remaining portions of the  abdomen and pelvis. Musculoskeletal: No acute or significant osseous findings. IMPRESSION: 1. Postoperative changes from gastric bypass surgery. Signs of suture line dehiscence with leak at the gastrojejunostomy site. Similar to the exam from 09/22/2019. 2. Persistent collection of gas as well as enteric contrast material which extends from the GJ suture line to the undersurface of the lateral segment of left lobe of liver. 3. Interval development of large fluid collection within the lower abdomen and pelvis. A second large fluid collection extends along the undersurface of the right hepatic lobe. Additional small interloop fluid collections are present elsewhere in the abdomen and pelvis. 4. Contrast opacified jejunal bowel loops appear increased in caliber concerning for paralytic ileus. 5. Similar appearance of bilateral lower lobe airspace consolidations and or atelectasis. These results were called by telephone at the time of interpretation on 09/29/2019 at 1:53 pm to provider [facility] , who verbally acknowledged these results. Electronically Signed   By: Signa Kell M.D.   On: 09/29/2019 13:53   CT IMAGE GUIDED DRAINAGE BY PERCUTANEOUS CATHETER  Result Date: 09/29/2019 INDICATION: 44 year old male with postoperative fluid collection. EXAM: CT-GUIDED DRAINAGE OF PELVIC FLUID COLLECTION MEDICATIONS: The patient is currently admitted to the hospital and receiving intravenous antibiotics. The antibiotics were administered within an appropriate time frame prior to the initiation of the procedure. ANESTHESIA/SEDATION: 2.0 mg IV Versed 200 mcg IV Fentanyl Moderate Sedation Time:  15 minutes The patient was continuously monitored during the procedure by the interventional radiology nurse under my direct supervision. COMPLICATIONS: None TECHNIQUE: Informed written consent was obtained from the patient after a thorough discussion of the procedural risks, benefits and alternatives. All questions were  addressed. Maximal Sterile Barrier Technique was utilized including caps, mask, sterile gowns, sterile gloves, sterile drape, hand hygiene and skin antiseptic. A timeout was performed prior to the initiation of the procedure. PROCEDURE: The left pelvis was prepped with chlorhexidine in a sterile fashion, and a sterile drape was applied covering the operative field. A sterile gown and sterile gloves were used for the procedure. Local anesthesia was provided with 1% Lidocaine. Using CT guidance, trocar needle was advanced into the fluid collection within the pelvis from a left pelvic approach. Modified Seldinger technique was used to place a 12 [place] drain. We then evacuated approximately 600 cc of thin serosanguineous dark maroon fluid. Culture was sent. Final image was stored. The catheter was attached to gravity drainage. Patient tolerated the procedure well and remained hemodynamically stable throughout. No complications were encountered and no significant blood loss. FINDINGS: Fluid collection within the pelvis, similar in appearance to the comparison CT. Pigtail catheter placed within the pelvic collection via left pelvic approach. IMPRESSION: Status post CT guided drainage of pelvic fluid collection. Signed, Yvone Neu. Reyne Dumas, RPVI [facility] Electronically Signed   By: Gilmer Mor D.O.   On: 09/29/2019 17:14    Anti-infectives: Anti-infectives (From admission, onward)   Start     Dose/Rate Route Frequency Ordered Stop   09/27/19 1000  anidulafungin (ERAXIS) 100 mg in sodium chloride 0.9 % 100 mL IVPB     Discontinue     100 mg 78 mL/hr over 100 Minutes Intravenous Every 24 hours 09/26/19 0819     09/26/19 1000  anidulafungin (ERAXIS) 200 mg in sodium chloride 0.9 % 200 mL IVPB        200 mg 78 mL/hr over 200 Minutes Intravenous  Once 09/26/19 0819 09/26/19 1334  09/24/19 0730  piperacillin-tazobactam (ZOSYN) IVPB 3.375 g      Discontinue    Note to Pharmacy: Pharmacy to check dosing   3.375 g 12.5 mL/hr over 240 Minutes Intravenous Every 8 hours 09/24/19 0722     09/24/19 0600  cefoTEtan (CEFOTAN) 2 g in sodium chloride 0.9 % 100 mL IVPB        2 g 200 mL/hr over 30 Minutes Intravenous On call to O.R. 09/23/19 1735 09/23/19 1830   09/22/19 0915  piperacillin-tazobactam (ZOSYN) IVPB 3.375 g        3.375 g 100 mL/hr over 30 Minutes Intravenous  Once 09/22/19 0906 09/22/19 1001      Assessment/Plan: s/p Procedure(s): LAPAROSCOPY DIAGNOSTIC, EGD, Drainage of intra-abdominal abscess (N/A) Continue bowel rest   Continue tpn for nutrition support  Obesity - BMI 41 Hyperglycemia -related to TNA, HgbA1C is normal  POD6,10, s/p lap roux-en-y bypass by Dr. 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 Laureen Ochs- 7/17 -persistent leak at GJ site. Leak appeared to be about 13mm in size with some necrotic and edematous/inflammatory changes noted around this site. -cont JP drains for now to control leak. One of the drains is putting out the most and today it has turned from succus to purulent, foul smelling drainage. -cont strict NPO/TNA for artificial nutrition since g-tube not placed -at this point, the leak is going to be difficult to repair and so therefore, conservative management with NPO/drains is likely going to be the treatment pending he does not clinically decline -mobilize,PT no HH needs, but will benefit from skilled PT while here -pulm toilet/IS, needs to really work on deep breathing.O2 off currently -cont zosyn, eraxis, WBC15K, decreasing. CT showed large fluid collection and additional drain was placed by IR  FEN -NPO/IVFs/TNA VTE -lovenox ID -[id number] -->, Eraxis 7/20 -->  LOS: 8 days    Chevis Pretty III 10/01/2019

## 2019-10-02 LAB — DIFFERENTIAL
Abs Immature Granulocytes: 1.07 10*3/uL — ABNORMAL HIGH (ref 0.00–0.07)
Basophils Absolute: 0.1 10*3/uL (ref 0.0–0.1)
Basophils Relative: 1 %
Eosinophils Absolute: 0.2 10*3/uL (ref 0.0–0.5)
Eosinophils Relative: 1 %
Immature Granulocytes: 8 %
Lymphocytes Relative: 8 %
Lymphs Abs: 1.2 10*3/uL (ref 0.7–4.0)
Monocytes Absolute: 1 10*3/uL (ref 0.1–1.0)
Monocytes Relative: 7 %
Neutro Abs: 10.8 10*3/uL — ABNORMAL HIGH (ref 1.7–7.7)
Neutrophils Relative %: 75 %

## 2019-10-02 LAB — COMPREHENSIVE METABOLIC PANEL
ALT: 35 U/L (ref 0–44)
AST: 18 U/L (ref 15–41)
Albumin: 2.5 g/dL — ABNORMAL LOW (ref 3.5–5.0)
Alkaline Phosphatase: 280 U/L — ABNORMAL HIGH (ref 38–126)
Anion gap: 10 (ref 5–15)
BUN: 14 mg/dL (ref 6–20)
CO2: 24 mmol/L (ref 22–32)
Calcium: 7.9 mg/dL — ABNORMAL LOW (ref 8.9–10.3)
Chloride: 95 mmol/L — ABNORMAL LOW (ref 98–111)
Creatinine, Ser: 0.87 mg/dL (ref 0.61–1.24)
GFR calc Af Amer: >60 mL/min (ref >60)
GFR calc non Af Amer: >60 mL/min (ref >60)
Glucose, Bld: 148 mg/dL — ABNORMAL HIGH (ref 70–99)
Potassium: 3.7 mmol/L (ref 3.5–5.1)
Sodium: 129 mmol/L — ABNORMAL LOW (ref 135–145)
Total Bilirubin: 0.6 mg/dL (ref 0.3–1.2)
Total Protein: 6.9 g/dL (ref 6.5–8.1)

## 2019-10-02 LAB — CBC
HCT: 35.3 % — ABNORMAL LOW (ref 39.0–52.0)
Hemoglobin: 11.2 g/dL — ABNORMAL LOW (ref 13.0–17.0)
MCH: 28.7 pg (ref 26.0–34.0)
MCHC: 31.7 g/dL (ref 30.0–36.0)
MCV: 90.5 fL (ref 80.0–100.0)
Platelets: 470 10*3/uL — ABNORMAL HIGH (ref 150–400)
RBC: 3.9 MIL/uL — ABNORMAL LOW (ref 4.22–5.81)
RDW: 13 % (ref 11.5–15.5)
WBC: 14.3 10*3/uL — ABNORMAL HIGH (ref 4.0–10.5)
nRBC: 0 % (ref 0.0–0.2)

## 2019-10-02 LAB — GLUCOSE, CAPILLARY
Glucose-Capillary: 183 mg/dL — ABNORMAL HIGH (ref 70–99)
Glucose-Capillary: 167 mg/dL — ABNORMAL HIGH (ref 70–99)
Glucose-Capillary: 129 mg/dL — ABNORMAL HIGH (ref 70–99)

## 2019-10-02 LAB — PREALBUMIN: Prealbumin: 12.7 mg/dL — ABNORMAL LOW (ref 18–38)

## 2019-10-02 LAB — MAGNESIUM: Magnesium: 2.4 mg/dL (ref 1.7–2.4)

## 2019-10-02 LAB — TRIGLYCERIDES: Triglycerides: 262 mg/dL — ABNORMAL HIGH (ref <150)

## 2019-10-02 LAB — PHOSPHORUS: Phosphorus: 4.2 mg/dL (ref 2.5–4.6)

## 2019-10-02 MED ORDER — SODIUM CHLORIDE 0.9% FLUSH
5.0000 mL | Freq: Three times a day (TID) | INTRAVENOUS | Status: DC
  Administered 2019-10-02 – 2019-10-12 (×29): 5 mL

## 2019-10-02 MED ORDER — TRAVASOL 10 % IV SOLN
INTRAVENOUS | Status: AC
  Filled 2019-10-02: qty 1272

## 2019-10-02 MED ORDER — POTASSIUM CHLORIDE 10 MEQ/50ML IV SOLN
10.0000 meq | INTRAVENOUS | Status: AC
  Administered 2019-10-02 (×2): 10 meq via INTRAVENOUS
  Filled 2019-10-02 (×2): qty 50

## 2019-10-02 NOTE — Progress Notes (Signed)
Nutrition Follow-up  DOCUMENTATION CODES:   Morbid obesity  INTERVENTION:   -TPN management per Pharmacy  NUTRITION DIAGNOSIS:   Inadequate oral intake related to inability to eat as evidenced by NPO status.  Ongoing.  GOAL:   Patient will meet greater than or equal to 90% of their needs  Meeting with TPN  MONITOR:   Diet advancement, Labs, Weight trends, Other (Comment) (TPN regimen)  ASSESSMENT:   44 year-old male with medical hx of obesity, HLD, anal fistula. He underwent Roux-en-Y gastric bypass in [place] on 7/14 and presented to the [facility] ED on 7/16 due to uncontrolled abdominal pain. He drove back from [place] on 7/15.  7/17: s/p laparoscopy with placement of drainage tubes and I&D of intra-abdominal abscess and EGD 7/18: PICC placed, TPN initiated 7/23: s/p IR drain placement  Patient continues to be NPO. TPN at 100 ml/hr providing 2296 kcals and 127g protein.  Admission weight: 290 lbs. Needs new weight.  I/Os: +11.7L since admit UOP: 2.5L x 24 hrs Drains: 175 ml   Medications: D5 infusion, KCl  Labs reviewed: CBGs: 172-183 Low Na TG: 262  Diet Order:   Diet Order            Diet NPO time specified  Diet effective now                 EDUCATION NEEDS:   Not appropriate for education at this time  Skin:  Skin Assessment: Reviewed RN Assessment  Last BM:  7/26  Height:   Ht Readings from Last 1 Encounters:  09/22/19 5\' 10"  (1.778 m)    Weight:   Wt Readings from Last 1 Encounters:  09/22/19 131.5 kg   BMI:  Body mass index is 41.61 kg/m.  Estimated Nutritional Needs:   Kcal:  2300-2500 kcal  Protein:  115-130 grams  Fluid:  >/= 2.5 L/day  09/24/19, MS, RD, LDN Inpatient Clinical Dietitian Contact information available via Amion

## 2019-10-02 NOTE — Progress Notes (Addendum)
9 Days Post-Op    CC: Abdominal pain  Subjective: No real change.  He has 3 drains.  The drain on the right is green purulent drainage.  The IR drain is a dark reddish serosanguineous fluid.  The third JP on the left is also a greenish purulent fluid, not quite as dark as the drain on the right.  He is tolerating TPN pain is controlled.  He is only moving about 750 on his I-S.  Objective: Vital signs in last 24 hours: Temp:  [98.2 F (36.8 C)-98.9 F (37.2 C)] 98.2 F (36.8 C) (07/26 0612) Pulse Rate:  [87-95] 87 (07/26 0612) Resp:  [18] 18 (07/26 0612) BP: (119-134)/(61-78) 134/67 (07/26 0612) SpO2:  [97 %-100 %] 97 % (07/26 0612) Last BM Date: 09/28/19 N.p.o. 2800 IV Urine 2500 160 left lower quadrant drain,  0 JP left 15 from right upper quadrant drain Afebrile vital signs are stable Sodium 129, potassium 3.7, glucose 148, alk phos 280, Prealbumin 12.7 WBC 14.3 H/H 11.2/35.3 Platelets 470,000 CT 7/23: Postoperative gastric bypass surgery with signs of suture line dehiscence with a leak at the [place] site.  Persistent collection of gas as well as enteric contrast extends from the GI suture line to the undersurface of the lateral segment left liver lobe.  Interval development of a large fluid collection within the lower abdomen pelvis a second large collection extends under the surface of the right hepatic lobe small additional interloop collections are present elsewhere.  Contrast opacified jejunal loop appears increased caliber concerning for ileus.  Bilateral lower lobe consolidation/atelectasis.  Intake/Output from previous day: 07/25 0701 - 07/26 0700 In: 2800 [I.V.:2499.7; IV Piggyback:290.3] Out: 2675 [Urine:2500; Drains:175] Intake/Output this shift: No intake/output data recorded.  General appearance: alert, cooperative and no distress Resp: clear to auscultation bilaterally GI: Large abdomen, he is not overly distended.  Drainage as noted above.  Lab Results:   Recent Labs    10/01/19 0500 10/02/19 0353  WBC 15.1* 14.3*  HGB 10.7* 11.2*  HCT 34.3* 35.3*  PLT 404* 470*    BMET Recent Labs    10/01/19 0500 10/02/19 0353  NA 133* 129*  K 4.1 3.7  CL 99 95*  CO2 26 24  GLUCOSE 156* 148*  BUN 16 14  CREATININE 0.90 0.87  CALCIUM 8.1* 7.9*   PT/INR Recent Labs    09/29/19 1356  LABPROT 13.0  INR 1.0    Recent Labs  Lab 09/26/19 0649 09/27/19 0600 09/28/19 0406 10/01/19 0500 10/02/19 0353  AST 17 35 38 21 18  ALT 26 41 54* 41 35  ALKPHOS 119 186* 222* 266* 280*  BILITOT 1.2 1.5* 1.4* 1.1 0.6  PROT 6.3* 5.8* 6.2* 7.1 6.9  ALBUMIN 2.3* 2.0* 2.2* 2.5* 2.5*     Lipase     Component Value Date/Time   LIPASE 31 09/22/2019 0623     Medications: . Chlorhexidine Gluconate Cloth  6 each Topical Q0600  . enoxaparin (LOVENOX) injection  40 mg Subcutaneous Q24H  . insulin aspart  0-9 Units Subcutaneous Q6H  . lidocaine  1 patch Transdermal Q24H  . mouth rinse  15 mL Mouth Rinse BID  . pantoprazole (PROTONIX) IV  40 mg Intravenous Q12H  . 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 Ferol Luz with postoperative leak POD#12  Diagnostic laparoscopy, placement of drainage tubes and irrigation/debridement of intra-abdominal abscess/EGD 09/23/2019, Dr. Marcello Moores [place]/Dr. Johnathan Hausen POD#9  -Persistent leak at [place] site ~5 mm in size/JP drain controlling leak   IR drain placement Left pelvis 09/29/19  FEN: N.p.o./TNA ID: Zosyn 7/18 >> day 9; anidulafungin 7/20>> day 7  DVT:  Lovenox Follow up:  TBD  Plan: Ongoing antibiotic, n.p.o./TNA..   LOS: 9 days    Jeet Shough 10/02/2019 Please see Amion

## 2019-10-02 NOTE — Progress Notes (Signed)
Referring Physician(s): Newman,D  Supervising Physician: Irish Lack  Patient Status:  [facility] - In-pt  Chief Complaint:  Abdominal pain/fluid collections  Subjective: Pt with some persistent abd/pelvic discomfort, but not worsening; otherwise no acute changes   Allergies: Patient has no known allergies.  Medications: Prior to Admission medications   Medication Sig Start Date End Date Taking? Authorizing Provider  hyoscyamine (LEVSIN SL) 0.125 MG SL tablet Place 0.125 mg under the tongue every 6 (six) hours. 09/19/19  Yes [provider]  ibuprofen (ADVIL) 100 MG/5ML suspension Take 30 mLs by mouth every 6 (six) hours as needed for pain. 09/19/19  Yes [provider]  ondansetron (ZOFRAN-ODT) 4 MG disintegrating tablet Take 4 mg by mouth every 6 (six) hours as needed for nausea/vomiting. 09/19/19  Yes [provider]  pantoprazole (PROTONIX) 40 MG tablet Take 40 mg by mouth daily. 09/19/19  Yes [provider]  cephALEXin (KEFLEX) 500 MG capsule Take 1 capsule (500 mg total) by mouth 3 (three) times daily. Patient not taking: Reported on 09/22/2019 02/23/14   Vivi Barrack, DPM  HYDROcodone-acetaminophen (NORCO/VICODIN) 5-325 MG tablet Take 1-2 tablets by mouth every 4 (four) hours as needed. Patient not taking: Reported on 09/22/2019 12/26/15   Romie Levee, MD  oxyCODONE-acetaminophen (PERCOCET/ROXICET) 5-325 MG tablet Take 1 tablet by mouth every 8 (eight) hours as needed for pain. 09/19/19   [provider]  PROMETHEGAN 25 MG suppository Place 25 mg rectally every 12 (twelve) hours as needed for nausea/vomiting. 09/19/19   [provider]     Vital Signs: BP (!) 134/67   Pulse 87   Temp 98.2 F (36.8 C) (Oral)   Resp 18   Ht 5\' 10"  (1.778 m)   Wt 290 lb (131.5 kg)   SpO2 97%   BMI 41.61 kg/m   Physical Exam awake/alert; LLQ drain intact, insertion site ok, mildly tender, OP 175 cc serosang fluid; also with 2  surgical drains on left /right abd regions  Imaging: CT ABDOMEN PELVIS W CONTRAST  Result Date: 09/29/2019 CLINICAL DATA:  Status post Roux-en-Y bypass. Follow-up intra-abdominal abscesses. EXAM: CT ABDOMEN AND PELVIS WITH CONTRAST TECHNIQUE: Multidetector CT imaging of the abdomen and pelvis was performed using the standard protocol following bolus administration of intravenous contrast. CONTRAST:  10/01/2019 OMNIPAQUE IOHEXOL 300 MG/ML  SOLN COMPARISON:  09/22/2019 FINDINGS: Lower chest: No pleural effusion. Similar appearance of bilateral lower lobe airspace consolidations and or atelectasis. Hepatobiliary: No focal liver abnormality. Gallbladder is unremarkable. No bile duct dilatation. Pancreas: Unremarkable. No pancreatic ductal dilatation or surrounding inflammatory changes. Spleen: Normal in size without focal abnormality. Adrenals/Urinary Tract: Normal adrenal glands. The kidneys are unremarkable. No kidney stone or hydronephrosis. Urinary bladder is normal. Stomach/Bowel: Postoperative changes from gastric bypass surgery identified. Signs of suture line dehiscence with leak at the gastrojejunostomy site noted, image 25/3 and image 41/5. Here, there is an adjacent collection of gas as well as enteric contrast material which extends from the suture line to the undersurface of the lateral segment of left lobe of liver, image 40/5. This appears similar to the exam from 09/22/2019. The remaining enteric contrast material passes into the jejunum up to the level of the ileocecal valve. Contrast opacified jejunal bowel loops appear increased in caliber measuring up to 3.5 cm concerning for paralytic ileus the colon appears decompressed. Vascular/Lymphatic: Normal appearance of the abdominal aorta. No abdominopelvic adenopathy identified. The portal vein and splenic vein are all patent. Reproductive: Prostate is unremarkable. Other: 2 percutaneous  surgical drainage catheters are identified within the upper abdomen.  The catheter which enters through the right upper quadrant ventral abdominal wallextends along the undersurface of the left hepatic lobe where the aforementioned gas and contrast collection situated. The second catheter which enters from a left upper quadrant approach does not appear associated with any fluid collection. There is a dominant fluid collection involving the lower abdomen and pelvis. This measures 13.5 x 11.2 by 14.3 cm, image 81/3 and image 56/5. Smaller fluid collection is identified extending along the undersurface of the right hepatic lobe measuring 15.6 cm in maximum dimension, best seen on the sagittal image 26/6. Smaller scattered interloop fluid collections are identified within the remaining portions of the abdomen and pelvis. Musculoskeletal: No acute or significant osseous findings. IMPRESSION: 1. Postoperative changes from gastric bypass surgery. Signs of suture line dehiscence with leak at the gastrojejunostomy site. Similar to the exam from 09/22/2019. 2. Persistent collection of gas as well as enteric contrast material which extends from the GJ suture line to the undersurface of the lateral segment of left lobe of liver. 3. Interval development of large fluid collection within the lower abdomen and pelvis. A second large fluid collection extends along the undersurface of the right hepatic lobe. Additional small interloop fluid collections are present elsewhere in the abdomen and pelvis. 4. Contrast opacified jejunal bowel loops appear increased in caliber concerning for paralytic ileus. 5. Similar appearance of bilateral lower lobe airspace consolidations and or atelectasis. These results were called by telephone at the time of interpretation on 09/29/2019 at 1:53 pm to provider [facility] , who verbally acknowledged these results. Electronically Signed   By: Signa Kell M.D.   On: 09/29/2019 13:53   CT IMAGE GUIDED DRAINAGE BY PERCUTANEOUS CATHETER  Result Date:  09/29/2019 INDICATION: 44 year old male with postoperative fluid collection. EXAM: CT-GUIDED DRAINAGE OF PELVIC FLUID COLLECTION MEDICATIONS: The patient is currently admitted to the hospital and receiving intravenous antibiotics. The antibiotics were administered within an appropriate time frame prior to the initiation of the procedure. ANESTHESIA/SEDATION: 2.0 mg IV Versed 200 mcg IV Fentanyl Moderate Sedation Time:  15 minutes The patient was continuously monitored during the procedure by the interventional radiology nurse under my direct supervision. COMPLICATIONS: None TECHNIQUE: Informed written consent was obtained from the patient after a thorough discussion of the procedural risks, benefits and alternatives. All questions were addressed. Maximal Sterile Barrier Technique was utilized including caps, mask, sterile gowns, sterile gloves, sterile drape, hand hygiene and skin antiseptic. A timeout was performed prior to the initiation of the procedure. PROCEDURE: The left pelvis was prepped with chlorhexidine in a sterile fashion, and a sterile drape was applied covering the operative field. A sterile gown and sterile gloves were used for the procedure. Local anesthesia was provided with 1% Lidocaine. Using CT guidance, trocar needle was advanced into the fluid collection within the pelvis from a left pelvic approach. Modified Seldinger technique was used to place a 12 [place] drain. We then evacuated approximately 600 cc of thin serosanguineous dark maroon fluid. Culture was sent. Final image was stored. The catheter was attached to gravity drainage. Patient tolerated the procedure well and remained hemodynamically stable throughout. No complications were encountered and no significant blood loss. FINDINGS: Fluid collection within the pelvis, similar in appearance to the comparison CT. Pigtail catheter placed within the pelvic collection via left pelvic approach. IMPRESSION: Status post CT guided drainage of  pelvic fluid collection. Signed, Yvone Neu. Reyne Dumas, RPVI [facility]  [facility] Electronically Signed   By: Gilmer Mor D.O.   On: 09/29/2019 17:14    Labs:  CBC: Recent Labs    09/29/19 0425 09/30/19 0400 10/01/19 0500 10/02/19 0353  WBC 18.7* 16.0* 15.1* 14.3*  HGB 10.8* 10.8* 10.7* 11.2*  HCT 34.6* 34.6* 34.3* 35.3*  PLT 306 366 404* 470*    COAGS: Recent Labs    09/22/19 0623 09/29/19 1356  INR 1.1 1.0    BMP: Recent Labs    09/29/19 0425 09/30/19 0400 10/01/19 0500 10/02/19 0353  NA 136 133* 133* 129*  K 3.7 4.1 4.1 3.7  CL 100 99 99 95*  CO2 25 25 26 24   GLUCOSE 164* 149* 156* 148*  BUN 16 14 16 14   CALCIUM 8.1* 8.0* 8.1* 7.9*  CREATININE 0.77 0.85 0.90 0.87  GFRNONAA >60 >60 >60 >60  GFRAA >60 >60 >60 >60    LIVER FUNCTION TESTS: Recent Labs    09/27/19 0600 09/28/19 0406 10/01/19 0500 10/02/19 0353  BILITOT 1.5* 1.4* 1.1 0.6  AST 35 38 21 18  ALT 41 54* 41 35  ALKPHOS 186* 222* 266* 280*  PROT 5.8* 6.2* 7.1 6.9  ALBUMIN 2.0* 2.2* 2.5* 2.5*    Assessment and Plan: Pt s/p outpatient lap Roux-en-Y bypass surgery in [place] 09/19/19; s/p diag lap with placement of 2 surgical drains 7/17 for post op leak at GJ site; s/p drainage of LLQ/pelvic fluid collection 7/23; afebrile, WBC 14.3 down from 15.1, hemoglobin stable, creatinine normal, drain fluid cultures with rare Enterobacter aerogenes; continue current treatment; once drain output less than 10 to 15 cc/day for 2-3 consecutive days obtain follow-up imaging; additional plans as per [facility]   Electronically Signed: D. 8/17, PA-C 10/02/2019, 9:56 AM   I spent a total of 15 minutes at the the patient's bedside AND on the patient's hospital floor or unit, greater than 50% of which was counseling/coordinating care for pelvic abscess drain    Patient ID: Brad Hughes, male   DOB: 1975-12-19, 44 y.o.   MRN: [id number]

## 2019-10-02 NOTE — Progress Notes (Signed)
Physical Therapy Treatment Patient Details Name: Brad Hughes MRN: [id number] DOB: 1975/07/25 Today's Date: 10/02/2019    History of Present Illness 44 YO male ,s/p lap roux-en-y bypass  7/13.Admitted 09/22/19 with fever, abdominal pain. Dx lap with drainage of intra-abdominal abscess, identification of leak via EGD, and placement of 3 JP drains, Dr. Laureen Ochs - 7/17    PT Comments    Pt improving with mobility and will likely not require RW soon for ambulation.  Pt encouraged to continue ambulating at least 4x/day.  Pt would like PT to check back however discussed PT will likely sign off next visit if pt continues to perform well.   Follow Up Recommendations  No PT follow up     Equipment Recommendations  None recommended by PT    Recommendations for Other Services       Precautions / Restrictions Precautions Precautions: Other (comment) Precaution Comments: abd drain, NPO    Mobility  Bed Mobility Overal bed mobility: Modified Independent                Transfers Overall transfer level: Needs assistance Equipment used: None Transfers: Sit to/from Stand Sit to Stand: Supervision            Ambulation/Gait Ambulation/Gait assistance: Supervision Gait Distance (Feet): 400 Feet Assistive device: Rolling walker (2 wheeled) Gait Pattern/deviations: Step-through pattern;Decreased stride length Gait velocity: decr   General Gait Details: uses RW for improved stability, prefers management of IV pole   Stairs             Wheelchair Mobility    Modified Rankin (Stroke Patients Only)       Balance                                            Cognition Arousal/Alertness: Awake/alert Behavior During Therapy: WFL for tasks assessed/performed Overall Cognitive Status: Within Functional Limits for tasks assessed                                        Exercises      General Comments        Pertinent  Vitals/Pain Pain Assessment: Faces Faces Pain Scale: Hurts a little bit Pain Location: ABD Pain Descriptors / Indicators: Tender;Sore Pain Intervention(s): Repositioned;Monitored during session    Home Living                      Prior Function            PT Goals (current goals can now be found in the care plan section) Progress towards PT goals: Progressing toward goals    Frequency    Min 3X/week      PT Plan Current plan remains appropriate    Co-evaluation              AM-PAC PT "6 Clicks" Mobility   Outcome Measure  Help needed turning from your back to your side while in a flat bed without using bedrails?: A Little Help needed moving from lying on your back to sitting on the side of a flat bed without using bedrails?: A Little Help needed moving to and from a bed to a chair (including a wheelchair)?: A Little Help needed standing up from a chair using your arms (e.g.,  wheelchair or bedside chair)?: A Little Help needed to walk in hospital room?: A Little Help needed climbing 3-5 steps with a railing? : A Little 6 Click Score: 18    End of Session   Activity Tolerance: Patient tolerated treatment well Patient left: with call bell/phone within reach;Other (comment) (prefers sitting at window seat)   PT Visit Diagnosis: Difficulty in walking, not elsewhere classified (R26.2)     Time: 9357-0177 PT Time Calculation (min) (ACUTE ONLY): 17 min  Charges:  $Gait Training: 8-22 mins                     Paulino Door, DPT Acute Rehabilitation Services Pager: [phone] Office: [phone]  Sarajane Jews 10/02/2019, 3:37 PM

## 2019-10-02 NOTE — Progress Notes (Signed)
PHARMACY - TOTAL PARENTERAL NUTRITION CONSULT NOTE   Indication: post-op bowel rest  Patient Measurements: Height: '5\' 10"'  (177.8 cm) Weight: 131.5 kg (290 lb) IBW/kg (Calculated) : 73 TPN AdjBW (KG): 87.6 Body mass index is 41.61 kg/m. Usual Weight: 131.5kg Adj body weight: ~ 100 kg  Assessment:  Brad Hughes admit 7/16 with post-op leak at Bandera anastomosis (surgery 7/12), Expl Lap 7/17, drains placed - Peritonitis, on Zosyn 3.375gm q8h, 7/20 Eraxis added   Glucose / Insulin: no hx DM, A1c is 5.6, CBGs 123-183 (goal <150), used 6 units SSI in 24 hrs - TPN currently with 30 units of insulin in bag  Electrolytes: Na remains low at 129 (max Na in TPN); other lytes wnl,  Renal: SCr  WNL LFTs / TGs: Alk phos up to 280 (on 7/26), TG 398 >> 443> 319 >> 236>> 237>> 243>262 Prealbumin / albumin: prealbumin < 5 on 7/19, up to 12.7 7/26;  Albumin 2.5 Intake / Output; MIVF:  net +125 mL, 175 ml from drain, -  D5 & 1/2NS w/20 KCl at 25 ml/lhr GI Imaging: 7/16 Chest/Abd CT: no PE, possible aspiration/lungs, pneumoperitoneum/gas, ascites, severe hepatic steatosis, mild diverticulosis - no inflammation Surgeries / Procedures: 7/13 Roux-en-Y bypass in [place], was d/c same day. 7/17 Expl Lap at [facility] - repair anastomosis  Central access: PICC 7/18 TPN start date: 7/18  Nutritional Goals ( RD recommendations on 7/19 ): KCal: 2300-2500 , Protein: 115-130, Fluid: >/= 2.5 L/day Goal TPN rate is 100 mL/hr (provides 120  g of protein and 2388 kcals per day with lipids, 1908 kcals per day w/o lipids)  Current Nutrition:  NPO, aspiration precautions  Plan:  Now: 2 runs KCL  Continue TPN at goal rate  of 100 ml/hr  Increase protein and lipids: new formula provides ~ 127 gm protein and ~ 2297 Kcal for ~ 100 % support  Electrolytes in TPN: 150 mEq/L of Na (max), continue 39mq/L of K, 551m/L of Ca, 91m54mL of Mg, and continue 10 mmol/L of Phosphorus; Cl:Ac ratio 1:1  Standard MVI and trace elements to  TPN  Continue  Sensitive q6h SSI and adjust as needed   Continue insulin 30 units to TPN  Continue MIVF to 25 mL/hr or per CCS  Monitor TPN labs on Mon/Thurs   [email or web address] [email or web address] [phone] 10/02/2019 7:Brad AM

## 2019-10-03 LAB — GLUCOSE, CAPILLARY
Glucose-Capillary: 169 mg/dL — ABNORMAL HIGH (ref 70–99)
Glucose-Capillary: 171 mg/dL — ABNORMAL HIGH (ref 70–99)

## 2019-10-03 MED ORDER — TRAVASOL 10 % IV SOLN
INTRAVENOUS | Status: AC
  Filled 2019-10-03: qty 1272

## 2019-10-03 NOTE — Progress Notes (Signed)
Referring Physician(s): Newman,D  Supervising Physician: Malachy Moan  Patient Status:  [name] - In-pt  Chief Complaint: Abdominal pain/fluid collections   Subjective: Pt without acute changes; denies worsening abd pain,N/V; has been ambulating   Allergies: Patient has no known allergies.  Medications: Prior to Admission medications   Medication Sig Start Date End Date Taking? Authorizing Provider  hyoscyamine (LEVSIN SL) 0.125 MG SL tablet Place 0.125 mg under the tongue every 6 (six) hours. 09/19/19  Yes [provider]  ibuprofen (ADVIL) 100 MG/5ML suspension Take 30 mLs by mouth every 6 (six) hours as needed for pain. 09/19/19  Yes [provider]  ondansetron (ZOFRAN-ODT) 4 MG disintegrating tablet Take 4 mg by mouth every 6 (six) hours as needed for nausea/vomiting. 09/19/19  Yes [provider]  pantoprazole (PROTONIX) 40 MG tablet Take 40 mg by mouth daily. 09/19/19  Yes [provider]  cephALEXin (KEFLEX) 500 MG capsule Take 1 capsule (500 mg total) by mouth 3 (three) times daily. Patient not taking: Reported on 09/22/2019 02/23/14   Vivi Barrack, DPM  HYDROcodone-acetaminophen (NORCO/VICODIN) 5-325 MG tablet Take 1-2 tablets by mouth every 4 (four) hours as needed. Patient not taking: Reported on 09/22/2019 12/26/15   Romie Levee, MD  oxyCODONE-acetaminophen (PERCOCET/ROXICET) 5-325 MG tablet Take 1 tablet by mouth every 8 (eight) hours as needed for pain. 09/19/19   [provider]  PROMETHEGAN 25 MG suppository Place 25 mg rectally every 12 (twelve) hours as needed for nausea/vomiting. 09/19/19   [provider]     Vital Signs: BP (!) 138/74 (BP Location: Left Arm)   Pulse 88   Temp 98.2 F (36.8 C) (Oral)   Resp 16   Ht 5\' 10"  (1.778 m)   Wt 290 lb (131.5 kg)   SpO2 97%   BMI 41.61 kg/m   Physical Exam awake/alert; LLQ drain intact, insertion site ok, mildly tender, OP 30 cc serosang fluid;  drain flushed without difficulty  Imaging: CT ABDOMEN PELVIS W CONTRAST  Result Date: 09/29/2019 CLINICAL DATA:  Status post Roux-en-Y bypass. Follow-up intra-abdominal abscesses. EXAM: CT ABDOMEN AND PELVIS WITH CONTRAST TECHNIQUE: Multidetector CT imaging of the abdomen and pelvis was performed using the standard protocol following bolus administration of intravenous contrast. CONTRAST:  10/01/2019 OMNIPAQUE IOHEXOL 300 MG/ML  SOLN COMPARISON:  09/22/2019 FINDINGS: Lower chest: No pleural effusion. Similar appearance of bilateral lower lobe airspace consolidations and or atelectasis. Hepatobiliary: No focal liver abnormality. Gallbladder is unremarkable. No bile duct dilatation. Pancreas: Unremarkable. No pancreatic ductal dilatation or surrounding inflammatory changes. Spleen: Normal in size without focal abnormality. Adrenals/Urinary Tract: Normal adrenal glands. The kidneys are unremarkable. No kidney stone or hydronephrosis. Urinary bladder is normal. Stomach/Bowel: Postoperative changes from gastric bypass surgery identified. Signs of suture line dehiscence with leak at the gastrojejunostomy site noted, image 25/3 and image 41/5. Here, there is an adjacent collection of gas as well as enteric contrast material which extends from the suture line to the undersurface of the lateral segment of left lobe of liver, image 40/5. This appears similar to the exam from 09/22/2019. The remaining enteric contrast material passes into the jejunum up to the level of the ileocecal valve. Contrast opacified jejunal bowel loops appear increased in caliber measuring up to 3.5 cm concerning for paralytic ileus the colon appears decompressed. Vascular/Lymphatic: Normal appearance of the abdominal aorta. No abdominopelvic adenopathy identified. The portal vein and splenic vein are all patent. Reproductive: Prostate is unremarkable. Other: 2 percutaneous surgical drainage catheters are  identified within the upper abdomen. The  catheter which enters through the right upper quadrant ventral abdominal wallextends along the undersurface of the left hepatic lobe where the aforementioned gas and contrast collection situated. The second catheter which enters from a left upper quadrant approach does not appear associated with any fluid collection. There is a dominant fluid collection involving the lower abdomen and pelvis. This measures 13.5 x 11.2 by 14.3 cm, image 81/3 and image 56/5. Smaller fluid collection is identified extending along the undersurface of the right hepatic lobe measuring 15.6 cm in maximum dimension, best seen on the sagittal image 26/6. Smaller scattered interloop fluid collections are identified within the remaining portions of the abdomen and pelvis. Musculoskeletal: No acute or significant osseous findings. IMPRESSION: 1. Postoperative changes from gastric bypass surgery. Signs of suture line dehiscence with leak at the gastrojejunostomy site. Similar to the exam from 09/22/2019. 2. Persistent collection of gas as well as enteric contrast material which extends from the GJ suture line to the undersurface of the lateral segment of left lobe of liver. 3. Interval development of large fluid collection within the lower abdomen and pelvis. A second large fluid collection extends along the undersurface of the right hepatic lobe. Additional small interloop fluid collections are present elsewhere in the abdomen and pelvis. 4. Contrast opacified jejunal bowel loops appear increased in caliber concerning for paralytic ileus. 5. Similar appearance of bilateral lower lobe airspace consolidations and or atelectasis. These results were called by telephone at the time of interpretation on 09/29/2019 at 1:53 pm to provider [facility] , who verbally acknowledged these results. Electronically Signed   By: Signa Kell M.D.   On: 09/29/2019 13:53   CT IMAGE GUIDED DRAINAGE BY PERCUTANEOUS CATHETER  Result Date:  09/29/2019 INDICATION: 44 year old male with postoperative fluid collection. EXAM: CT-GUIDED DRAINAGE OF PELVIC FLUID COLLECTION MEDICATIONS: The patient is currently admitted to the hospital and receiving intravenous antibiotics. The antibiotics were administered within an appropriate time frame prior to the initiation of the procedure. ANESTHESIA/SEDATION: 2.0 mg IV Versed 200 mcg IV Fentanyl Moderate Sedation Time:  15 minutes The patient was continuously monitored during the procedure by the interventional radiology nurse under my direct supervision. COMPLICATIONS: None TECHNIQUE: Informed written consent was obtained from the patient after a thorough discussion of the procedural risks, benefits and alternatives. All questions were addressed. Maximal Sterile Barrier Technique was utilized including caps, mask, sterile gowns, sterile gloves, sterile drape, hand hygiene and skin antiseptic. A timeout was performed prior to the initiation of the procedure. PROCEDURE: The left pelvis was prepped with chlorhexidine in a sterile fashion, and a sterile drape was applied covering the operative field. A sterile gown and sterile gloves were used for the procedure. Local anesthesia was provided with 1% Lidocaine. Using CT guidance, trocar needle was advanced into the fluid collection within the pelvis from a left pelvic approach. Modified Seldinger technique was used to place a 12 [place] drain. We then evacuated approximately 600 cc of thin serosanguineous dark maroon fluid. Culture was sent. Final image was stored. The catheter was attached to gravity drainage. Patient tolerated the procedure well and remained hemodynamically stable throughout. No complications were encountered and no significant blood loss. FINDINGS: Fluid collection within the pelvis, similar in appearance to the comparison CT. Pigtail catheter placed within the pelvic collection via left pelvic approach. IMPRESSION: Status post CT guided drainage of  pelvic fluid collection. Signed, Yvone Neu. Reyne Dumas, RPVI [facility] Electronically Signed  By: Gilmer Mor D.O.   On: 09/29/2019 17:14    Labs:  CBC: Recent Labs    09/29/19 0425 09/30/19 0400 10/01/19 0500 10/02/19 0353  WBC 18.7* 16.0* 15.1* 14.3*  HGB 10.8* 10.8* 10.7* 11.2*  HCT 34.6* 34.6* 34.3* 35.3*  PLT 306 366 404* 470*    COAGS: Recent Labs    09/22/19 0623 09/29/19 1356  INR 1.1 1.0    BMP: Recent Labs    09/29/19 0425 09/30/19 0400 10/01/19 0500 10/02/19 0353  NA 136 133* 133* 129*  K 3.7 4.1 4.1 3.7  CL 100 99 99 95*  CO2 25 25 26 24   GLUCOSE 164* 149* 156* 148*  BUN 16 14 16 14   CALCIUM 8.1* 8.0* 8.1* 7.9*  CREATININE 0.77 0.85 0.90 0.87  GFRNONAA >60 >60 >60 >60  GFRAA >60 >60 >60 >60    LIVER FUNCTION TESTS: Recent Labs    09/27/19 0600 09/28/19 0406 10/01/19 0500 10/02/19 0353  BILITOT 1.5* 1.4* 1.1 0.6  AST 35 38 21 18  ALT 41 54* 41 35  ALKPHOS 186* 222* 266* 280*  PROT 5.8* 6.2* 7.1 6.9  ALBUMIN 2.0* 2.2* 2.5* 2.5*    Assessment and Plan: Pt s/p outpatient lap Roux-en-Y bypass surgery in [place] 09/19/19; s/p diag lap with placement of 2 surgical drains 7/17 for post op leak at GJ site; s/p drainage of LLQ/pelvic fluid collection 7/23; afebrile, no new labs; continue current treatment; once drain output less than 10 to 15 cc/day for 2-3 consecutive days obtain follow-up imaging; additional plans as per CCS   Electronically Signed: D. 8/17, PA-C 10/03/2019, 10:17 AM   I spent a total of 15 minutes at the the patient's bedside AND on the patient's hospital floor or unit, greater than 50% of which was counseling/coordinating care for pelvic abscess drain    Patient ID: Brad Hughes, male   DOB: 06/16/1975, 44 y.o.   MRN: [id number]

## 2019-10-03 NOTE — Progress Notes (Signed)
10 Days Post-Op    CC:  Abdominal pain  Subjective: He is up walking drainage from both JP's, appears a little lighter.    Objective: Vital signs in last 24 hours: Temp:  [98.2 F (36.8 C)-98.3 F (36.8 C)] 98.2 F (36.8 C) (07/27 0547) Pulse Rate:  [88-93] 88 (07/27 0547) Resp:  [16-18] 16 (07/27 0547) BP: (111-138)/(65-75) 138/74 (07/27 0547) SpO2:  [97 %-98 %] 97 % (07/27 0547) Last BM Date: 10/02/19 3873 IV 1700 uriine LLQ drain 30 Left JP25 RUQ 0 Stool x 1 Afebrile, VSS No labs Intake/Output from previous day: 07/26 0701 - 07/27 0700 In: 3873.1 [I.V.:3455.8; IV Piggyback:417.3] Out: 1730 [Urine:1700; Drains:30] Intake/Output this shift: No intake/output data recorded.  General appearance: alert, cooperative and no distress Resp: clear to auscultation bilaterally GI: soft, sore sites OK drainage is lighter on the 2 JPs, IR drain still serosangious   Lab Results:  Recent Labs    10/01/19 0500 10/02/19 0353  WBC 15.1* 14.3*  HGB 10.7* 11.2*  HCT 34.3* 35.3*  PLT 404* 470*    BMET Recent Labs    10/01/19 0500 10/02/19 0353  NA 133* 129*  K 4.1 3.7  CL 99 95*  CO2 26 24  GLUCOSE 156* 148*  BUN 16 14  CREATININE 0.90 0.87  CALCIUM 8.1* 7.9*   PT/INR No results for input(s): LABPROT, INR in the last 72 hours.  Recent Labs  Lab 09/27/19 0600 09/28/19 0406 10/01/19 0500 10/02/19 0353  AST 35 38 21 18  ALT 41 54* 41 35  ALKPHOS 186* 222* 266* 280*  BILITOT 1.5* 1.4* 1.1 0.6  PROT 5.8* 6.2* 7.1 6.9  ALBUMIN 2.0* 2.2* 2.5* 2.5*     Lipase     Component Value Date/Time   LIPASE 31 09/22/2019 0623     Medications: . Chlorhexidine Gluconate Cloth  6 each Topical Q0600  . enoxaparin (LOVENOX) injection  40 mg Subcutaneous Q24H  . insulin aspart  0-9 Units Subcutaneous Q6H  . lidocaine  1 patch Transdermal Q24H  . mouth rinse  15 mL Mouth Rinse BID  . pantoprazole (PROTONIX) IV  40 mg Intravenous Q12H  . sodium chloride flush  10-40 mL  Intracatheter Q12H  . 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 Maisie Fus Cornett/Dr. Luretha Murphy POD#10  -Persistent leak at GJ site ~5 mm in size/JP drain controlling leak   IR drain placement Left pelvis 09/29/19  FEN: N.p.o./TNA ID: Zosyn 7/18 >> day 9; anidulafungin 7/20>> day 7  DVT:  Lovenox Follow up:  TBD  Plan:  Continue current Rx.     LOS: 10 days    Aashka Salomone 10/03/2019 Please see Amion

## 2019-10-03 NOTE — Progress Notes (Addendum)
PHARMACY - TOTAL PARENTERAL NUTRITION CONSULT NOTE   Indication: post-op bowel rest  Patient Measurements: Height: '5\' 10"'  (177.8 cm) Weight: 131.5 kg (290 lb) IBW/kg (Calculated) : 73 TPN AdjBW (KG): 87.6 Body mass index is 41.61 kg/m. Usual Weight: 131.5kg Adj body weight: ~ 100 kg  Assessment:  38 yoM admit 7/16 with post-op leak at [place] anastomosis (surgery 7/12), Expl Lap 7/17, drains placed - Peritonitis, on Zosyn 3.375gm q8h, 7/20 Eraxis added   Glucose / Insulin: no hx DM, A1c is 5.6, CBGs 129-167  (goal <150), used 6 units SSI in 24 hrs - TPN currently with 30 units of insulin in bag  Electrolytes: no labs today; yesterday: Na remains low at 129 (max Na in TPN); other lytes wnl,  Renal: SCr  WNL LFTs / TGs: Alk phos up to 280 (on 7/26), TG 398 >> 443> 319 >> 236>> 237>> 243>262 (7/26) Prealbumin / albumin: prealbumin < 5 on 7/19, up to 12.7 7/26;  Albumin 2.5 Intake / Output; MIVF:  net +2143 mL, 30 ml from drain, -  D5 & 1/2NS w/20 KCl at 25 ml/lhr GI Imaging: 7/16 Chest/Abd CT: no PE, possible aspiration/lungs, pneumoperitoneum/gas, ascites, severe hepatic steatosis, mild diverticulosis - no inflammation Surgeries / Procedures: 7/13 Roux-en-Y bypass in [place], was d/c same day. 7/17 Expl Lap at [facility] - repair anastomosis  Central access: PICC 7/18 TPN start date: 7/18  Nutritional Goals ( RD recommendations on 7/26 ): KCal: 2300-2500 , Protein: 115-130, Fluid: >/= 2.5 L/day Goal TPN rate is 100 mL/hr (provides 120  g of protein and 2388 kcals per day with lipids, 1908 kcals per day w/o lipids)  Current Nutrition:  NPO, aspiration precautions  Plan:   Continue TPN at goal rate  of 100 ml/hr  TPN provides ~ 127 gm protein and ~ 2297 Kcal for ~ 100 % support  Electrolytes in TPN: 150 mEq/L of Na (max), 852mq/L of K, 558m/L of Ca, 52m62mL of Mg, and continue 10 mmol/L of Phosphorus; Cl:Ac ratio 1:1  Standard MVI and trace elements to TPN  Continue  Sensitive q6h SSI and  adjust as needed   Increase insulin to 35 units to TPN  Continue MIVF to 25 mL/hr or per CCS  Monitor TPN labs on Mon/Thurs, BMET, Mg & Phos in AM   MicEudelia Bunchharm.D [phone] 10/03/2019 7:24 AM

## 2019-10-04 LAB — PHOSPHORUS: Phosphorus: 4.3 mg/dL (ref 2.5–4.6)

## 2019-10-04 LAB — AEROBIC/ANAEROBIC CULTURE W GRAM STAIN (SURGICAL/DEEP WOUND)

## 2019-10-04 LAB — BASIC METABOLIC PANEL WITH GFR
Anion gap: 10 (ref 5–15)
BUN: 14 mg/dL (ref 6–20)
CO2: 25 mmol/L (ref 22–32)
Calcium: 8.4 mg/dL — ABNORMAL LOW (ref 8.9–10.3)
Chloride: 99 mmol/L (ref 98–111)
Creatinine, Ser: 0.8 mg/dL (ref 0.61–1.24)
GFR calc Af Amer: >60 mL/min
GFR calc non Af Amer: >60 mL/min
Glucose, Bld: 148 mg/dL — ABNORMAL HIGH (ref 70–99)
Potassium: 4.1 mmol/L (ref 3.5–5.1)
Sodium: 134 mmol/L — ABNORMAL LOW (ref 135–145)

## 2019-10-04 LAB — GLUCOSE, CAPILLARY
Glucose-Capillary: 156 mg/dL — ABNORMAL HIGH (ref 70–99)
Glucose-Capillary: 163 mg/dL — ABNORMAL HIGH (ref 70–99)

## 2019-10-04 LAB — MAGNESIUM: Magnesium: 2.3 mg/dL (ref 1.7–2.4)

## 2019-10-04 MED ORDER — TRAVASOL 10 % IV SOLN
INTRAVENOUS | Status: AC
  Filled 2019-10-04: qty 1272

## 2019-10-04 NOTE — Progress Notes (Signed)
Referring Physician(s): Newman,D  Supervising Physician: Irish Lack  Patient Status:  [facility] - In-pt  Chief Complaint:  Abdominal pain/fluid collections  Subjective: Pt sitting up in chair ; has ambulated in hallway; states abd pain is decreasing some; denies N/V   Allergies: Patient has no known allergies.  Medications: Prior to Admission medications   Medication Sig Start Date End Date Taking? Authorizing Provider  hyoscyamine (LEVSIN SL) 0.125 MG SL tablet Place 0.125 mg under the tongue every 6 (six) hours. 09/19/19  Yes [provider]  ibuprofen (ADVIL) 100 MG/5ML suspension Take 30 mLs by mouth every 6 (six) hours as needed for pain. 09/19/19  Yes [provider]  ondansetron (ZOFRAN-ODT) 4 MG disintegrating tablet Take 4 mg by mouth every 6 (six) hours as needed for nausea/vomiting. 09/19/19  Yes [provider]  pantoprazole (PROTONIX) 40 MG tablet Take 40 mg by mouth daily. 09/19/19  Yes [provider]  cephALEXin (KEFLEX) 500 MG capsule Take 1 capsule (500 mg total) by mouth 3 (three) times daily. Patient not taking: Reported on 09/22/2019 02/23/14   Vivi Barrack, DPM  HYDROcodone-acetaminophen (NORCO/VICODIN) 5-325 MG tablet Take 1-2 tablets by mouth every 4 (four) hours as needed. Patient not taking: Reported on 09/22/2019 12/26/15   Romie Levee, MD  oxyCODONE-acetaminophen (PERCOCET/ROXICET) 5-325 MG tablet Take 1 tablet by mouth every 8 (eight) hours as needed for pain. 09/19/19   [provider]  PROMETHEGAN 25 MG suppository Place 25 mg rectally every 12 (twelve) hours as needed for nausea/vomiting. 09/19/19   [provider]     Vital Signs: BP (!) 129/80 (BP Location: Left Arm)   Pulse 87   Temp 98.6 F (37 C)   Resp 18   Ht 5\' 10"  (1.778 m)   Wt 290 lb (131.5 kg)   SpO2 96%   BMI 41.61 kg/m   Physical Exam awake/alert; LLQ drain intact, insertion site ok, mildly tender, OP 70 cc rust  colored fluid  Imaging: No results found.  Labs:  CBC: Recent Labs    09/29/19 0425 09/30/19 0400 10/01/19 0500 10/02/19 0353  WBC 18.7* 16.0* 15.1* 14.3*  HGB 10.8* 10.8* 10.7* 11.2*  HCT 34.6* 34.6* 34.3* 35.3*  PLT 306 366 404* 470*    COAGS: Recent Labs    09/22/19 0623 09/29/19 1356  INR 1.1 1.0    BMP: Recent Labs    09/30/19 0400 10/01/19 0500 10/02/19 0353 10/04/19 0424  NA 133* 133* 129* 134*  K 4.1 4.1 3.7 4.1  CL 99 99 95* 99  CO2 25 26 24 25   GLUCOSE 149* 156* 148* 148*  BUN 14 16 14 14   CALCIUM 8.0* 8.1* 7.9* 8.4*  CREATININE 0.85 0.90 0.87 0.80  GFRNONAA >60 >60 >60 >60  GFRAA >60 >60 >60 >60    LIVER FUNCTION TESTS: Recent Labs    09/27/19 0600 09/28/19 0406 10/01/19 0500 10/02/19 0353  BILITOT 1.5* 1.4* 1.1 0.6  AST 35 38 21 18  ALT 41 54* 41 35  ALKPHOS 186* 222* 266* 280*  PROT 5.8* 6.2* 7.1 6.9  ALBUMIN 2.0* 2.2* 2.5* 2.5*    Assessment and Plan: Pts/p outpatient lap Roux-en-Y bypass surgery in [place] 09/19/19; s/p diag lap with placement of 2 surgical drains 7/17 for post op leak at GJ site; s/p drainage of LLQ/pelvic fluid collection 7/23;afebrile, creat nl; continue current treatment;once drain output less than 10 to 15 cc/day for 2-3 consecutive days obtain follow-up imaging;additional plans as per CCS  Electronically Signed: D. Jeananne Rama, PA-C 10/04/2019, 9:56 AM   I spent a total of 15 minutes at the the patient's bedside AND on the patient's hospital floor or unit, greater than 50% of which was counseling/coordinating care for left pelvic abscess drain    Patient ID: Brad Hughes, male   DOB: 10/19/1975, 43 y.o.   MRN: [id number]

## 2019-10-04 NOTE — Progress Notes (Signed)
PHARMACY - TOTAL PARENTERAL NUTRITION CONSULT NOTE   Indication: post-op bowel rest  Patient Measurements: Height: _0  (177.8 cm) Weight: 131.5 kg (290 lb) IBW/kg (Calculated) : 73 TPN AdjBW (KG): 87.6 Body mass index is 41.61 kg/m. Usual Weight: 131.5kg Adj body weight: ~ 100 kg  Assessment:  72 yoM admit 7/16 with post-op leak at [place] anastomosis (surgery 7/12), Expl Lap 7/17, drains placed - Peritonitis, on Zosyn 3.375gm q8h, 7/20 Eraxis added   Glucose / Insulin: no hx DM, A1c is 5.6, CBGs 156-163  (goal <150), used 7 units SSI in 24 hrs - TPN currently with 35 units of insulin in bag  Electrolytes: Na up to 134 with Max Na in TPN; other lytes WNL,  Renal: SCr  WNL LFTs / TGs: Alk phos up to 280 (on 7/26), TG 398 >> 443> 319 >> 236>> 237>> 243>262 (7/26) Prealbumin / albumin: prealbumin < 5 on 7/19, up to 12.7 7/26;  Albumin 2.5 Intake / Output; MIVF:  net +2143 mL, 30 ml from drain, -  D5 & 1/2NS w/20 KCl at 25 ml/lhr GI Imaging: 7/16 Chest/Abd CT: no PE, possible aspiration/lungs, pneumoperitoneum/gas, ascites, severe hepatic steatosis, mild diverticulosis - no inflammation Surgeries / Procedures: 7/13 [place]oux-en-Y bypass in [place], was d/c same day. 7/17 Expl [facility] - repair anastomosis  Central access: PICC 7/18 TPN start date: 7/18  Nutritional Goals ( RD recommendations on 7/26 ): KCal: 2300-2500 , Protein: 115-130, Fluid: >/= 2.5 L/day Goal TPN rate is 100 mL/hr (provides 120  g of protein and 2388 kcals per day with lipids, 1908 kcals per day w/o lipids)  Current Nutrition:  NPO, aspiration precautions  Plan:   Continue TPN at goal rate  of 100 ml/hr  TPN provides ~ 127 gm protein and ~ 2297 Kcal for ~ 100 % support  Electrolytes in TPN: 150 mEq/L of Na (max), 67mq/L of K, 551m/L of Ca, 53m83mL of Mg, and continue 10 mmol/L of Phosphorus; Cl:Ac ratio 1:1  Standard MVI and trace elements to TPN  Continue  Sensitive q6h SSI and adjust as needed   Increase  insulin to 40 units to TPN  Continue MIVF to 25 mL/hr or per CCS  Monitor TPN labs on Mon/Thurs  MicEudelia Bunchharm.D 10/04/2019 7:22 AM

## 2019-10-04 NOTE — Progress Notes (Signed)
Physical Therapy Treatment Patient Details Name: Brad Hughes MRN: [id number] DOB: 07-24-1975 Today's Date: 10/04/2019    History of Present Illness 44 YO male ,s/p lap roux-en-y bypass  7/13.Admitted 09/22/19 with fever, abdominal pain. Dx lap with drainage of intra-abdominal abscess, identification of leak via EGD, and placement of 3 JP drains, Dr. Hansel Starling - 7/17    PT Comments    Pt ambulated 10' without an assistive device, no loss of balance. He stated he has been ambulating in the halls independently. He has met PT goals, will sign off. Encouraged pt to continue ambulating in the halls independently several times/day.    Follow Up Recommendations  No PT follow up     Equipment Recommendations  None recommended by PT    Recommendations for Other Services       Precautions / Restrictions Precautions Precautions: Other (comment) Precaution Comments: 3 abdominal drains (1 R, 2L), NPO Restrictions Weight Bearing Restrictions: No    Mobility  Bed Mobility Overal bed mobility: Modified Independent             General bed mobility comments: log roll to R using rail, HOB up  Transfers Overall transfer level: Independent Equipment used: None Transfers: Sit to/from Stand Sit to Stand: Independent            Ambulation/Gait Ambulation/Gait assistance: Independent Gait Distance (Feet): 450 Feet Assistive device: None Gait Pattern/deviations: Step-through pattern;Decreased stride length Gait velocity: decr   General Gait Details: steady, no loss of balance   Stairs             Wheelchair Mobility    Modified Rankin (Stroke Patients Only)       Balance Overall balance assessment: No apparent balance deficits (not formally assessed)                                          Cognition Arousal/Alertness: Awake/alert Behavior During Therapy: WFL for tasks assessed/performed Overall Cognitive Status: Within Functional  Limits for tasks assessed                                 General Comments: AxO very motivated      Exercises      General Comments        Pertinent Vitals/Pain Faces Pain Scale: Hurts a little bit Pain Location: abdomen Pain Descriptors / Indicators: Tender;Sore Pain Intervention(s): Limited activity within patient's tolerance;Monitored during session;Premedicated before session    Home Living                      Prior Function            PT Goals (current goals can now be found in the care plan section) Acute Rehab PT Goals 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, 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 PT Goal Formulation: With patient Time For Goal Achievement: 10/10/19 Progress towards PT goals: Goals met/education completed, patient discharged from PT    Frequency    Min 3X/week      PT Plan Current plan remains appropriate    Co-evaluation              AM-PAC PT "6 Clicks" Mobility   Outcome Measure  Help needed turning from your back to your side while in a flat bed without using bedrails?: None  Help needed moving from lying on your back to sitting on the side of a flat bed without using bedrails?: None Help needed moving to and from a bed to a chair (including a wheelchair)?: None Help needed standing up from a chair using your arms (e.g., wheelchair or bedside chair)?: None Help needed to walk in hospital room?: None Help needed climbing 3-5 steps with a railing? : None 6 Click Score: 24    End of Session   Activity Tolerance: Patient tolerated treatment well Patient left: with call bell/phone within reach;in chair Nurse Communication: Mobility status PT Visit Diagnosis: Difficulty in walking, not elsewhere classified (R26.2)     Time: 9211-9417 PT Time Calculation (min) (ACUTE ONLY): 16 min  Charges:  $Gait Training: 8-22 mins                    Blondell Reveal Kistler PT 10/04/2019  Acute Rehabilitation  Services Pager [phone] Office [phone]

## 2019-10-04 NOTE — Progress Notes (Signed)
11 Days Post-Op  Subjective: CC: Doing well. Sitting up on room bench. Pain well controlled. Still having some pain in epigastrium. No n/v. Passing flatus. BM yesterday that was mushy and soft. Mobilizing.   Objective: Vital signs in last 24 hours: Temp:  [98.4 F (36.9 C)-99.1 F (37.3 C)] 98.6 F (37 C) (07/28 0558) Pulse Rate:  [87-96] 87 (07/28 0558) Resp:  [17-18] 18 (07/28 0558) BP: (116-129)/(77-80) 129/80 (07/28 0558) SpO2:  [96 %-98 %] 96 % (07/28 0558) Last BM Date: 10/03/19  Intake/Output from previous day: 07/27 0701 - 07/28 0700 In: 1664.6 [I.V.:1326.5; IV Piggyback:328.1] Out: 1347 [Urine:1225; Drains:122] Intake/Output this shift: No intake/output data recorded.  PE: Gen:  Alert, NAD, pleasant Pulm: normal rate and effort  Abd: Soft, ND, mild epigastric tenderness without peritonitis, slightly hypoactive bowel sounds, right JP with scant tan/pink drainage. 22cc/24 hours. Left surgical drain with tan milky fluid. 30cc/24 hours. LLQ IR drain with SS fluid. 70cc/24 hours.  Ext:  No LE edema  Psych: A&Ox3  Skin: no rashes noted, warm and dry   Lab Results:  Recent Labs    10/02/19 0353  WBC 14.3*  HGB 11.2*  HCT 35.3*  PLT 470*   BMET Recent Labs    10/02/19 0353 10/04/19 0424  NA 129* 134*  K 3.7 4.1  CL 95* 99  CO2 24 25  GLUCOSE 148* 148*  BUN 14 14  CREATININE 0.87 0.80  CALCIUM 7.9* 8.4*   PT/INR No results for input(s): LABPROT, INR in the last 72 hours. CMP     Component Value Date/Time   NA 134 (L) 10/04/2019 0424   K 4.1 10/04/2019 0424   CL 99 10/04/2019 0424   CO2 25 10/04/2019 0424   GLUCOSE 148 (H) 10/04/2019 0424   BUN 14 10/04/2019 0424   CREATININE 0.80 10/04/2019 0424   CALCIUM 8.4 (L) 10/04/2019 0424   PROT 6.9 10/02/2019 0353   ALBUMIN 2.5 (L) 10/02/2019 0353   AST 18 10/02/2019 0353   ALT 35 10/02/2019 0353   ALKPHOS 280 (H) 10/02/2019 0353   BILITOT 0.6 10/02/2019 0353   GFRNONAA >60 10/04/2019 0424    GFRAA >60 10/04/2019 0424   Lipase     Component Value Date/Time   LIPASE 31 09/22/2019 0623       Studies/Results: No results found.  Anti-infectives: Anti-infectives (From admission, onward)   Start     Dose/Rate Route Frequency Ordered Stop   09/27/19 1000  anidulafungin (ERAXIS) 100 mg in sodium chloride 0.9 % 100 mL IVPB     Discontinue     100 mg 78 mL/hr over 100 Minutes Intravenous Every 24 hours 09/26/19 0819     09/26/19 1000  anidulafungin (ERAXIS) 200 mg in sodium chloride 0.9 % 200 mL IVPB        200 mg 78 mL/hr over 200 Minutes Intravenous  Once 09/26/19 0819 09/26/19 1334   09/24/19 0730  piperacillin-tazobactam (ZOSYN) IVPB 3.375 g     Discontinue    Note to Pharmacy: Pharmacy to check dosing   3.375 g 12.5 mL/hr over 240 Minutes Intravenous Every 8 hours 09/24/19 0722     09/24/19 0600  cefoTEtan (CEFOTAN) 2 g in sodium chloride 0.9 % 100 mL IVPB        2 g 200 mL/hr over 30 Minutes Intravenous On call to O.R. 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 Maisie Fus Cornett/Dr. Cala Bradford - Persistent leak at GJ sitewith JP drain controlling leak  - S/p IR drain placement left pelvis 09/29/19 - Continue NPO, TPN - Cont abx. Cx with enterobacter. Will discuss with MD about narrowing abx - Mobilize - IS - Repeat lab in AM  FEN: N.p.o./TNA ID: Zosyn 7/18 >>day 10;  anidulafungin 7/20>>day 8 DVT: SCDs, Lovenox Follow up: TBD   LOS: 11 days    Brad Hughes , [facility] 10/04/2019, 8:45 AM Please see Amion for pager number during day hours 7:00am-4:30pm

## 2019-10-05 ENCOUNTER — Inpatient Hospital Stay (HOSPITAL_COMMUNITY): Payer: Medicaid Other

## 2019-10-05 ENCOUNTER — Encounter (HOSPITAL_COMMUNITY): Payer: Self-pay

## 2019-10-05 LAB — CBC
HCT: 32.5 % — ABNORMAL LOW (ref 39.0–52.0)
Hemoglobin: 10.5 g/dL — ABNORMAL LOW (ref 13.0–17.0)
MCH: 28.6 pg (ref 26.0–34.0)
MCHC: 32.3 g/dL (ref 30.0–36.0)
MCV: 88.6 fL (ref 80.0–100.0)
Platelets: 436 10*3/uL — ABNORMAL HIGH (ref 150–400)
RBC: 3.67 MIL/uL — ABNORMAL LOW (ref 4.22–5.81)
RDW: 12.8 % (ref 11.5–15.5)
WBC: 8.7 10*3/uL (ref 4.0–10.5)
nRBC: 0 % (ref 0.0–0.2)

## 2019-10-05 LAB — COMPREHENSIVE METABOLIC PANEL
ALT: 36 U/L (ref 0–44)
AST: 23 U/L (ref 15–41)
Albumin: 2.6 g/dL — ABNORMAL LOW (ref 3.5–5.0)
Alkaline Phosphatase: 311 U/L — ABNORMAL HIGH (ref 38–126)
Anion gap: 13 (ref 5–15)
BUN: 13 mg/dL (ref 6–20)
CO2: 23 mmol/L (ref 22–32)
Calcium: 8.6 mg/dL — ABNORMAL LOW (ref 8.9–10.3)
Chloride: 99 mmol/L (ref 98–111)
Creatinine, Ser: 0.87 mg/dL (ref 0.61–1.24)
GFR calc Af Amer: >60 mL/min (ref >60)
GFR calc non Af Amer: >60 mL/min (ref >60)
Glucose, Bld: 138 mg/dL — ABNORMAL HIGH (ref 70–99)
Potassium: 4.3 mmol/L (ref 3.5–5.1)
Sodium: 135 mmol/L (ref 135–145)
Total Bilirubin: 0.5 mg/dL (ref 0.3–1.2)
Total Protein: 7 g/dL (ref 6.5–8.1)

## 2019-10-05 LAB — MAGNESIUM: Magnesium: 2.2 mg/dL (ref 1.7–2.4)

## 2019-10-05 LAB — PHOSPHORUS: Phosphorus: 4.1 mg/dL (ref 2.5–4.6)

## 2019-10-05 LAB — GLUCOSE, CAPILLARY
Glucose-Capillary: 130 mg/dL — ABNORMAL HIGH (ref 70–99)
Glucose-Capillary: 136 mg/dL — ABNORMAL HIGH (ref 70–99)
Glucose-Capillary: 154 mg/dL — ABNORMAL HIGH (ref 70–99)

## 2019-10-05 MED ORDER — IOHEXOL 300 MG/ML  SOLN
100.0000 mL | Freq: Once | INTRAMUSCULAR | Status: AC | PRN
  Administered 2019-10-05: 18:00:00 100 mL via INTRAVENOUS

## 2019-10-05 MED ORDER — TRAVASOL 10 % IV SOLN
INTRAVENOUS | Status: AC
  Filled 2019-10-05: qty 1272

## 2019-10-05 MED ORDER — IOHEXOL 9 MG/ML PO SOLN
ORAL | Status: AC
  Administered 2019-10-05: 500 mL
  Filled 2019-10-05: qty 1000

## 2019-10-05 MED ORDER — LIDOCAINE 5 % EX PTCH
1.0000 | MEDICATED_PATCH | CUTANEOUS | Status: DC
  Administered 2019-10-05 – 2019-10-10 (×6): 1 via TRANSDERMAL
  Filled 2019-10-05 (×8): qty 1

## 2019-10-05 MED ORDER — IOHEXOL 9 MG/ML PO SOLN
500.0000 mL | ORAL | Status: AC
  Administered 2019-10-05 (×2): 500 mL via ORAL

## 2019-10-05 MED ORDER — SODIUM CHLORIDE (PF) 0.9 % IJ SOLN
INTRAMUSCULAR | Status: AC
  Filled 2019-10-05: qty 50

## 2019-10-05 NOTE — Progress Notes (Signed)
Referring Physician(s): Dr. Ezzard Standing  Supervising Physician: Malachy Moan  Patient Status:  [facility] - In-pt  Chief Complaint: Abdominal pain/fluid collections  Subjective: Patient sitting up in bed, drinking contrast. He states he is feeling good; very pleasant and conversational. Surgery ordered a CT abdomen/pelvis for today.   HPI:  Mr. 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 He presented to the [facility] ED 09/22/19 with abdominal pain. CT abdomen was negative for post-operative leak and he was admitted for observation. On 09/23/19 patient developed a low grade temperature and tachycardia in addition to worsening pain and distention. He was taken to the OR by Dr. Luisa Hart and a disruption of the gastrojejunostomy anastomosis was discovered, with suture line breakdown and peritonitis. Two upper abdominal drains were placed at that time.  Repeat CT imaging done 09/29/19 showed the development of a lower abdominal fluid collection and a drain was placed by IR that same day.   Allergies: Patient has no known allergies.  Medications: Prior to Admission medications   Medication Sig Start Date End Date Taking? Authorizing Provider  hyoscyamine (LEVSIN SL) 0.125 MG SL tablet Place 0.125 mg under the tongue every 6 (six) hours. 09/19/19  Yes [provider]  ibuprofen (ADVIL) 100 MG/5ML suspension Take 30 mLs by mouth every 6 (six) hours as needed for pain. 09/19/19  Yes [provider]  ondansetron (ZOFRAN-ODT) 4 MG disintegrating tablet Take 4 mg by mouth every 6 (six) hours as needed for nausea/vomiting. 09/19/19  Yes [provider]  pantoprazole (PROTONIX) 40 MG tablet Take 40 mg by mouth daily. 09/19/19  Yes [provider]  cephALEXin (KEFLEX) 500 MG capsule Take 1 capsule (500 mg total) by mouth 3 (three) times daily. Patient not taking: Reported on 09/22/2019 02/23/14   Vivi Barrack, DPM  HYDROcodone-acetaminophen  (NORCO/VICODIN) 5-325 MG tablet Take 1-2 tablets by mouth every 4 (four) hours as needed. Patient not taking: Reported on 09/22/2019 12/26/15   Romie Levee, MD  oxyCODONE-acetaminophen (PERCOCET/ROXICET) 5-325 MG tablet Take 1 tablet by mouth every 8 (eight) hours as needed for pain. 09/19/19   [provider]  PROMETHEGAN 25 MG suppository Place 25 mg rectally every 12 (twelve) hours as needed for nausea/vomiting. 09/19/19   [provider]     Vital Signs: BP (!) 143/80 (BP Location: Left Arm)   Pulse 88   Temp 99.2 F (37.3 C) (Oral)   Resp 18   Ht 5\' 10"  (1.778 m)   Wt 290 lb (131.5 kg)   SpO2 98%   BMI 41.61 kg/m   Physical Exam Constitutional:      General: He is not in acute distress.    Appearance: He is well-developed.  Pulmonary:     Effort: Pulmonary effort is normal.  Abdominal:     Comments: Two upper abdominal JP drains in place. Pelvic drain placed by IR is intact, approximately 10 cc of serosanguineous fluid in gravity bag. Skin insertion site is clean and dry, no erythema, swelling or drainage present.   Neurological:     Mental Status: He is alert.      Labs:  CBC: Recent Labs    09/30/19 0400 10/01/19 0500 10/02/19 0353 10/05/19 0337  WBC 16.0* 15.1* 14.3* 8.7  HGB 10.8* 10.7* 11.2* 10.5*  HCT 34.6* 34.3* 35.3* 32.5*  PLT 366 404* 470* 436*    COAGS: Recent Labs    09/22/19 0623 09/29/19 1356  INR 1.1 1.0    BMP:  Recent Labs    10/01/19 0500 10/02/19 0353 10/04/19 0424 10/05/19 0337  NA 133* 129* 134* 135  K 4.1 3.7 4.1 4.3  CL 99 95* 99 99  CO2 26 24 25 23   GLUCOSE 156* 148* 148* 138*  BUN 16 14 14 13   CALCIUM 8.1* 7.9* 8.4* 8.6*  CREATININE 0.90 0.87 0.80 0.87  GFRNONAA >60 >60 >60 >60  GFRAA >60 >60 >60 >60    LIVER FUNCTION TESTS: Recent Labs    09/28/19 0406 10/01/19 0500 10/02/19 0353 10/05/19 0337  BILITOT 1.4* 1.1 0.6 0.5  AST 38 21 18 23   ALT 54* 41 35 36  ALKPHOS 222* 266* 280* 311*   PROT 6.2* 7.1 6.9 7.0  ALBUMIN 2.2* 2.5* 2.5* 2.6*    Assessment and Plan:  Abdominal fluid collections s/p Roux-en-y gastric bypass: Patient is now s/p diagnostic laparoscopy with Dr. 10/04/19 and the placement of 2 surgical drains 09/23/19. He is also s/p LLQ pelvic fluid collection aspiration with drain placement 09/29/19. Vital signs are stable, WBC today is 8.7. CT abdomen pelvis was ordered today by surgery - this is pending. 20 cc output documented in Epic, another 10 cc in gravity bag during assessment.   Continue current drain orders. IR will continue to follow. Other plans per CCS.    Electronically Signed: Luisa Hart, AGACNP-BC [phone] 10/05/2019, 11:50 AM   I spent a total of 15 Minutes at the the patient's bedside AND on the patient's hospital floor or unit, greater than 50% of which was counseling/coordinating care for left pelvic abscess drain.

## 2019-10-05 NOTE — Progress Notes (Signed)
PHARMACY - TOTAL PARENTERAL NUTRITION CONSULT NOTE   Indication: post-op bowel rest  Patient Measurements: Height: 5' 10" (177.8 cm) Weight: 131.5 kg (290 lb) IBW/kg (Calculated) : 73 TPN AdjBW (KG): 87.6 Body mass index is 41.61 kg/m. Usual Weight: 131.5kg Adj body weight: ~ 100 kg  Assessment:  24 yoM admit 7/16 with post-op leak at [place] anastomosis (surgery 7/12), Expl Lap 7/17, drains placed - Peritonitis, on Zosyn 3.375gm q8h, 7/20 Eraxis added   Glucose / Insulin: no hx DM, A1c is 5.6, CBGs down to 154 after insulin in TPN increased to yesterday  (goal <150), used 7 units SSI in 24 hrs - TPN currently with 40 units of insulin in bag  Electrolytes: Na up to 135 with Max Na in TPN; other lytes WNL,  Renal: SCr  WNL LFTs / TGs: Alk phos up to 311 (on 7/29), TG 398 >> 443> 319 >> 236>> 237>> 243>262 (7/26) Lipids were removed from TPN 7/19, added back 10 g/L on  7/23 then increased to 15 g/L on 7/26 Prealbumin / albumin: prealbumin < 5 on 7/19, up to 12.7 7/26;  Albumin 2.6 Intake / Output; MIVF:  net +277.7 mL, 77 ml from drains, -  D5 & 1/2NS w/20 KCl at 25 ml/lhr GI Imaging: 7/16 Chest/Abd CT: no PE, possible aspiration/lungs, pneumoperitoneum/gas, ascites, severe hepatic steatosis, mild diverticulosis - no inflammation Surgeries / Procedures: 7/13 Roux-en-Y bypass in [place], was d/c same day. 7/17 Expl Lap at [facility] - repair anastomosis  Central access: PICC 7/18 TPN start date: 7/18  Nutritional Goals ( RD recommendations on 7/26 ): KCal: 2300-2500 , Protein: 115-130, Fluid: >/= 2.5 L/day Goal TPN rate is 100 mL/hr (provides 120  g of protein and 2388 kcals per day with lipids, 1908 kcals per day w/o lipids)  Current Nutrition:  NPO, aspiration precautions  Plan:   Continue TPN at goal rate  of 100 ml/hr  TPN provides ~ 127 gm protein and ~ 2297 Kcal for ~ 100 % support  Electrolytes in TPN: 150 mEq/L of Na (max), 77mq/L of K, 530m/L of Ca, 35m59mL of Mg, and continue 10  mmol/L of Phosphorus; Cl:Ac ratio 1:1  Standard MVI and trace elements to TPN  Continue  Sensitive q6h SSI and adjust as needed   continue insulin 40 units to TPN  Continue MIVF to 25 mL/hr or per CCS  Monitor TPN labs on Mon/Thurs  MicEudelia Bunchharm.D 10/05/2019 7:14 AM

## 2019-10-05 NOTE — Progress Notes (Addendum)
12 Days Post-Op  Subjective: CC: Doing well. Wants something to drink. Some epigastric abdominal pain that he rates up to a 6/10. No nausea. Passing flatus. Last BM 7/27. Mobilizing.   Objective: Vital signs in last 24 hours: Temp:  [98.3 F (36.8 C)-99.2 F (37.3 C)] 99.2 F (37.3 C) (07/29 0546) Pulse Rate:  [87-92] 88 (07/29 0546) Resp:  [18] 18 (07/29 0546) BP: (124-143)/(80-81) 143/80 (07/29 0546) SpO2:  [96 %-98 %] 98 % (07/29 0546) Last BM Date: 10/03/19  Intake/Output from previous day: 07/28 0701 - 07/29 0700 In: 2054.7 [I.V.:1824.8; IV Piggyback:229.9] Out: 1777 [Urine:1700; Drains:77] Intake/Output this shift: No intake/output data recorded.  PE: Gen:  Alert, NAD, pleasant Pulm: normal rate and effort  Abd: Soft, ND, mild epigastric tenderness without peritonitis, slightly hypoactive bowel sounds, right JP with scant tan/pink drainage. 5cc/24 hours. Left surgical drain with tan milky fluid. 52cc/24 hours. LLQ IR drain with SS fluid. 20cc/24 hours.  Ext:  No LE edema  Psych: A&Ox3  Skin: no rashes noted, warm and dry  Lab Results:  Recent Labs    10/05/19 0337  WBC 8.7  HGB 10.5*  HCT 32.5*  PLT 436*   BMET Recent Labs    10/04/19 0424 10/05/19 0337  NA 134* 135  K 4.1 4.3  CL 99 99  CO2 25 23  GLUCOSE 148* 138*  BUN 14 13  CREATININE 0.80 0.87  CALCIUM 8.4* 8.6*   PT/INR No results for input(s): LABPROT, INR in the last 72 hours. CMP     Component Value Date/Time   NA 135 10/05/2019 0337   K 4.3 10/05/2019 0337   CL 99 10/05/2019 0337   CO2 23 10/05/2019 0337   GLUCOSE 138 (H) 10/05/2019 0337   BUN 13 10/05/2019 0337   CREATININE 0.87 10/05/2019 0337   CALCIUM 8.6 (L) 10/05/2019 0337   PROT 7.0 10/05/2019 0337   ALBUMIN 2.6 (L) 10/05/2019 0337   AST 23 10/05/2019 0337   ALT 36 10/05/2019 0337   ALKPHOS 311 (H) 10/05/2019 0337   BILITOT 0.5 10/05/2019 0337   GFRNONAA >60 10/05/2019 0337   GFRAA >60 10/05/2019 0337   Lipase      Component Value Date/Time   LIPASE 31 09/22/2019 0623       Studies/Results: No results found.  Anti-infectives: Anti-infectives (From admission, onward)   Start     Dose/Rate Route Frequency Ordered Stop   09/27/19 1000  anidulafungin (ERAXIS) 100 mg in sodium chloride 0.9 % 100 mL IVPB  Status:  Discontinued        100 mg 78 mL/hr over 100 Minutes Intravenous Every 24 hours 09/26/19 0819 10/04/19 1614   09/26/19 1000  anidulafungin (ERAXIS) 200 mg in sodium chloride 0.9 % 200 mL IVPB        200 mg 78 mL/hr over 200 Minutes Intravenous  Once 09/26/19 0819 09/26/19 1334   09/24/19 0730  piperacillin-tazobactam (ZOSYN) IVPB 3.375 g     Discontinue    Note to Pharmacy: Pharmacy to check dosing   3.375 g 12.5 mL/hr over 240 Minutes Intravenous Every 8 hours 09/24/19 0722     09/24/19 0600  cefoTEtan (CEFOTAN) 2 g in sodium chloride 0.9 % 100 mL IVPB        2 g 200 mL/hr over 30 Minutes Intravenous On call to O.R. 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 Maisie Fus Cornett/Dr. Otho Ket - Last CT 7/23 w/ persistent leak at GJ sitewith JP drain controlling leak . Repeat CT today - S/p IR drain placement left pelvis 09/29/19 - Continue NPO, TPN (monitor LFT's, slightly elevated) - Cont abx. Cx with enterobacter. Cont Zosyn - Mobilize - IS - Repeat lab in AM  FEN: N.p.o./TNA ID: Zosyn  7/18 >>day 11 DVT: SCDs, Lovenox Follow up: TBD   LOS: 12 days    Jacinto Halim , [facility] 10/05/2019, 8:33 AM Please see Amion for pager number during day hours 7:00am-4:30pm

## 2019-10-05 NOTE — TOC Progression Note (Signed)
Transition of Care [facility]) - Progression Note    Patient Details  Name: Brad Brad MRN: [id number] Date of Birth: [birth year]  [facility] CM/SW Contact  Lennart Pall, LCSW Phone Number: 10/05/2019, 1:21 PM  Clinical Narrative:    Met with pt today to introduce myself/ role.  Have been monitoring chart and pt progress and pt hopeful he may be getting closer to d/c readiness.  He will return home with his wife who is not currently working.  They have a 2 yr old child in the home and several family members living locally.   Confirmed that pt is currently uninsured.  He did meet with financial counselor yesterday to begin Medicaid application process.  Pt does not have a primary MD and we discussed possible referrals to one the the medical clinics under [facility] - pt agreeable with a referral.  May also need assistance with medications at d/c - will monitor. Pt reports they are to do follow up scans today and hopes to have more information about d/c after that is done.  Plan to follow up with pt again tomorrow.   Expected Discharge Plan: Home/Self Care Barriers to Discharge: Continued Medical Work up  Expected Discharge Plan and Services Expected Discharge Plan: Home/Self Care   Discharge Planning Services: CM Consult   Living arrangements for the past 2 months: Single Family Home                                       Social Determinants of Health (SDOH) Interventions    Readmission Risk Interventions No flowsheet data found.

## 2019-10-06 ENCOUNTER — Encounter (HOSPITAL_COMMUNITY): Payer: Self-pay

## 2019-10-06 ENCOUNTER — Inpatient Hospital Stay (HOSPITAL_COMMUNITY): Payer: Medicaid Other

## 2019-10-06 LAB — BASIC METABOLIC PANEL
Anion gap: 7 (ref 5–15)
BUN: <5 mg/dL — ABNORMAL LOW (ref 6–20)
CO2: 26 mmol/L (ref 22–32)
Calcium: 8.4 mg/dL — ABNORMAL LOW (ref 8.9–10.3)
Chloride: 107 mmol/L (ref 98–111)
Creatinine, Ser: 0.57 mg/dL — ABNORMAL LOW (ref 0.61–1.24)
GFR calc Af Amer: >60 mL/min (ref >60)
GFR calc non Af Amer: >60 mL/min (ref >60)
Glucose, Bld: 89 mg/dL (ref 70–99)
Potassium: 3.1 mmol/L — ABNORMAL LOW (ref 3.5–5.1)
Sodium: 140 mmol/L (ref 135–145)

## 2019-10-06 LAB — CBC
HCT: 33.7 % — ABNORMAL LOW (ref 39.0–52.0)
Hemoglobin: 10.7 g/dL — ABNORMAL LOW (ref 13.0–17.0)
MCH: 28.1 pg (ref 26.0–34.0)
MCHC: 31.8 g/dL (ref 30.0–36.0)
MCV: 88.5 fL (ref 80.0–100.0)
Platelets: 451 10*3/uL — ABNORMAL HIGH (ref 150–400)
RBC: 3.81 MIL/uL — ABNORMAL LOW (ref 4.22–5.81)
RDW: 12.6 % (ref 11.5–15.5)
WBC: 7.8 10*3/uL (ref 4.0–10.5)
nRBC: 0 % (ref 0.0–0.2)

## 2019-10-06 LAB — GLUCOSE, CAPILLARY: Glucose-Capillary: 144 mg/dL — ABNORMAL HIGH (ref 70–99)

## 2019-10-06 MED ORDER — POTASSIUM CHLORIDE 10 MEQ/50ML IV SOLN
10.0000 meq | INTRAVENOUS | Status: AC
  Administered 2019-10-06 (×5): 10 meq via INTRAVENOUS
  Filled 2019-10-06 (×5): qty 50

## 2019-10-06 MED ORDER — TRAVASOL 10 % IV SOLN
INTRAVENOUS | Status: AC
  Filled 2019-10-06: qty 1272

## 2019-10-06 NOTE — Progress Notes (Signed)
PHARMACY - TOTAL PARENTERAL NUTRITION CONSULT NOTE   Indication: post-op bowel rest  Patient Measurements: Height: _0  (177.8 cm) Weight: 131.5 kg (290 lb) IBW/kg (Calculated) : 73 TPN AdjBW (KG): 87.6 Body mass index is 41.61 kg/m. Usual Weight: 131.5kg Adj body weight: ~ 100 kg  Assessment:  47 yoM admit 7/16 with post-op leak at [place] anastomosis (surgery 7/12), Expl Lap 7/17, drains placed - Peritonitis, on Zosyn 3.375gm q8h,  Eraxis 7/20-7/28  Glucose / Insulin: no hx DM, A1c is 5.6, CBGs 130-136 (goal <150), used 5 units SSI in 24 hrs - TPN currently with 40 units of insulin in bag  Electrolytes: Na up to 140 with Max Na in TPN; K 3.1   Renal: SCr  WNL LFTs / TGs: Alk phos up to 311 (on 7/29), TG 398 >> 443> 319 >> 236>> 237>> 243>262 (7/26) Lipids were removed from TPN 7/19, added back 10 g/L on  7/23 then increased to 15 g/L on 7/26 Prealbumin / albumin: prealbumin < 5 on 7/19, up to 12.7 7/26;  Albumin 2.6 Intake / Output; MIVF:  net +1969 mL, 1200 ml UOP/24  Hrs, 40 ml from drains, -  D5 & 1/2NS w/20 KCl at 25 ml/lhr GI Imaging: 7/16 Chest/Abd CT: no PE, possible aspiration/lungs, pneumoperitoneum/gas, ascites, severe hepatic steatosis, mild diverticulosis - no inflammation 7/29 CT: interval decrease in size of fluid collections s/p drain placement, and evaluation of gastrojejunostomy is limited in the absence of oral contrast within the stomach Surgeries / Procedures: 7/13 [place]oux-en[place] bypass in [place], was d/c same day. 7/17 Expl Lap at [facility] - repair anastomosis  Central access: PICC 7/18 TPN start date: 7/18  Nutritional Goals ( RD recommendations on 7/26 ): KCal: 2300-2500 , Protein: 115-130, Fluid: >/= 2.5 L/day Goal TPN rate is 100 mL/hr (provides 120  g of protein and 2388 kcals per day with lipids, 1908 kcals per day w/o lipids)  Current Nutrition:  NPO, aspiration precautions  Plan:  Now: 5 runs KCL  Continue TPN at goal rate  of 100 ml/hr  TPN provides ~ 127 gm  protein and ~ 2297 Kcal for ~ 100 % support  Electrolytes in TPN: decr to 120 mEq/L of Na, 14mq/L of K, 577m/L of Ca, 54m84mL of Mg, and continue 10 mmol/L of Phosphorus; Cl:Ac ratio 1:1  Standard MVI and trace elements to TPN  Continue  Sensitive q6h SSI and adjust as needed   continue insulin 40 units to TPN  Continue MIVF to 25 mL/hr or per CCS  Monitor TPN labs on Mon/Thurs, BMET & triglycerides in AM  f/u UGI ordered for today. If negative for leak may start on clear liquids.   MicEudelia Bunchharm.D 10/06/2019 8:39 AM

## 2019-10-06 NOTE — Progress Notes (Addendum)
[facility] Progress Note  13 Days Post-Op  Subjective: CC-  Abdominal pain slowly improving. Rates pain as 4/10. Denies n/v. States that he is thirsty. Passing flatus, BM yesterday. He has already been up in chair this morning. CT yesterday shows interval decrease in size of fluid collections s/p drain placement, and evaluation of gastrojejunostomy is limited in the absence of oral contrast within the stomach.  Objective: Vital signs in last 24 hours: Temp:  [98.2 F (36.8 C)-98.8 F (37.1 C)] 98.2 F (36.8 C) (07/30 0607) Pulse Rate:  [84-89] 84 (07/30 0607) Resp:  [19-20] 19 (07/30 0607) BP: (126-138)/(75-86) 132/84 (07/30 0607) SpO2:  [94 %-98 %] 94 % (07/30 0607) Last BM Date: 10/05/19  Intake/Output from previous day: 07/29 0701 - 07/30 0700 In: 3208.9 [I.V.:2976.5; IV Piggyback:222.5] Out: 1240 [Urine:1200; Drains:40] Intake/Output this shift: No intake/output data recorded.  PE: Gen: Alert, NAD, pleasant Pulm: normal rate and effort Abd: Soft,ND, mild epigastric tenderness without peritonitis,few BS heard,right JP with no drainage in bulb. Left surgical drain with tan milky fluid. LLQ IR drain with SS fluid Ext: No LE edema, calves soft and nontender Psych: A&Ox3  Skin: no rashes noted, warm and dry   Lab Results:  Recent Labs    10/05/19 0337 10/06/19 0400  WBC 8.7 7.8  HGB 10.5* 10.7*  HCT 32.5* 33.7*  PLT 436* 451*   BMET Recent Labs    10/05/19 0337 10/06/19 0400  NA 135 140  K 4.3 3.1*  CL 99 107  CO2 23 26  GLUCOSE 138* 89  BUN 13 <5*  CREATININE 0.87 0.57*  CALCIUM 8.6* 8.4*   PT/INR No results for input(s): LABPROT, INR in the last 72 hours. CMP     Component Value Date/Time   NA 140 10/06/2019 0400   K 3.1 (L) 10/06/2019 0400   CL 107 10/06/2019 0400   CO2 26 10/06/2019 0400   GLUCOSE 89 10/06/2019 0400   BUN <5 (L) 10/06/2019 0400   CREATININE 0.57 (L) 10/06/2019 0400   CALCIUM 8.4 (L) 10/06/2019 0400   PROT  7.0 10/05/2019 0337   ALBUMIN 2.6 (L) 10/05/2019 0337   AST 23 10/05/2019 0337   ALT 36 10/05/2019 0337   ALKPHOS 311 (H) 10/05/2019 0337   BILITOT 0.5 10/05/2019 0337   GFRNONAA >60 10/06/2019 0400   GFRAA >60 10/06/2019 0400   Lipase     Component Value Date/Time   LIPASE 31 09/22/2019 0623       Studies/Results: CT ABDOMEN PELVIS W CONTRAST  Result Date: 10/05/2019 CLINICAL DATA:  44 year old male with concern for abdominal infection/abscess. Status post recent Roux-en-Y gastric bypass. EXAM: CT ABDOMEN AND PELVIS WITH CONTRAST TECHNIQUE: Multidetector CT imaging of the abdomen and pelvis was performed using the standard protocol following bolus administration of intravenous contrast. CONTRAST:  OMNIPAQUE IOHEXOL 300 MG/ML  SOLN COMPARISON:  CT abdomen pelvis dated 09/29/2019. FINDINGS: Lower chest: Small left and trace right pleural effusion, with slight interval increase in the size of left pleural effusion compared to the prior CT. There are bilateral lower lobe partial consolidative changes which may represent atelectasis or infiltrate. Clinical correlation is recommended. Partially visualized central venous line with tip at the cavoatrial junction. No intra-abdominal free air. There is diffuse mesenteric edema and stranding. Hepatobiliary: Apparent fatty infiltration of the liver. No intrahepatic biliary ductal dilatation. The gallbladder is unremarkable. Pancreas: Unremarkable. No pancreatic ductal dilatation or surrounding inflammatory changes. Spleen: Normal in size without focal abnormality. Adrenals/Urinary Tract: The adrenal  glands unremarkable. There is no hydronephrosis on either side. There is symmetric enhancement and excretion of contrast by both kidneys. The visualized ureters appear unremarkable. The urinary bladder is collapsed. Stomach/Bowel: There is postsurgical changes of gastric bypass. Evaluation of the gastrojejunostomy is limited in the absence of oral  contrast within the stomach. Several mildly dilated small bowel loops in the left hemiabdomen measure up to 4 cm in caliber. Oral contrast however noted throughout the colon from prior administration. The dilated small bowel loops in the left hemiabdomen likely represent a degree of ileus. The appendix is normal. Vascular/Lymphatic: The abdominal aorta and IVC unremarkable. No portal venous gas. There is no adenopathy. Reproductive: The prostate and seminal vesicles are grossly unremarkable. Other: There is a small loculated collection with a small pocket of air along the undersurface of the left lobe of the liver and caudate lobe corresponding to the collection seen on the prior CT. There has been interval decrease in the size of this collection compared to the prior study. Several drainage catheters in the upper abdomen are in similar position. Similarly there has been interval decrease in the size of the loculated collection along the undersurface of the right lobe of the liver measuring approximately 3.3 x 2.1 cm on coronal series 5, image 62 (previously 6.2 x 3.7 cm. There has been interval placement of a pigtail drainage catheter in the loculated collection within the pelvis superior to the bladder. There is significant interval decrease in the size of this collection compared to the prior CT. Several small additional loculated collections noted in the left hemiabdomen and along the small bowel loops. Musculoskeletal: No acute or significant osseous findings. IMPRESSION: 1. Postsurgical changes of gastric bypass. Mildly dilated small bowel loops in the left hemiabdomen likely represent a degree of ileus. No evidence of bowel obstruction. Normal appendix. 2. Interval placement of a pigtail drainage catheter in the loculated collection within the pelvis with significant interval decrease in the size of this collection compared to the prior CT. 3. Interval decrease in the size of additional loculated collections  along the undersurface of the liver. 4. Small left and trace right pleural effusion with bibasilar atelectasis or infiltrate. Clinical correlation is recommended. Electronically Signed   By: Elgie Collard M.D.   On: 10/05/2019 19:19    Anti-infectives: Anti-infectives (From admission, onward)   Start     Dose/Rate Route Frequency Ordered Stop   09/27/19 1000  anidulafungin (ERAXIS) 100 mg in sodium chloride 0.9 % 100 mL IVPB  Status:  Discontinued        100 mg 78 mL/hr over 100 Minutes Intravenous Every 24 hours 09/26/19 0819 10/04/19 1614   09/26/19 1000  anidulafungin (ERAXIS) 200 mg in sodium chloride 0.9 % 200 mL IVPB        200 mg 78 mL/hr over 200 Minutes Intravenous  Once 09/26/19 0819 09/26/19 1334   09/24/19 0730  piperacillin-tazobactam (ZOSYN) IVPB 3.375 g     Discontinue    Note to Pharmacy: Pharmacy to check dosing   3.375 g 12.5 mL/hr over 240 Minutes Intravenous Every 8 hours 09/24/19 0722     09/24/19 0600  cefoTEtan (CEFOTAN) 2 g in sodium chloride 0.9 % 100 mL IVPB        2 g 200 mL/hr over 30 Minutes Intravenous On call to O.R. 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 Maisie Fus Cornett/Dr. Jefm Miles - S/pIR drain placementleft pelvis 09/29/19 >> cx: Enterobacter aerogenes. Continue zosyn - continue drain x3 and monitor output - CT 7/29 shows interval decrease in size of fluid  collections s/p drain placement, and evaluation of gastrojejunostomy is limited in the absence of oral contrast within the stomach. - Continue NPO, TPN (monitor LFT's, slightly elevated)  FEN: N.p.o./TNA ID: Zosyn 7/18 >>day#12 DVT: SCDs,Lovenox Follow up: TBD  Plan: Difficult to evaluate gastrojejunostomy on CT from yesterday. Will obtain UGI today. If negative for leak may start on clear liquids. Continue TPN. Continue drains and IV antibiotics.  UGI is negative for a leak, post gastrectomy and Roux-en-Y.  Will start on ice chips and sips of water today.     LOS: 13 days    Franne Forts, [facility] 10/06/2019, 8:40 AM Please see Amion for pager number during day hours 7:00am-4:30pm

## 2019-10-06 NOTE — Progress Notes (Signed)
Referring Physician(s): Newman,D  Supervising Physician: Ruel Favors  Patient Status:  [name]  Chief Complaint: Abdominal pain/fluid collections   Subjective: Pt denies worsening abd pain,N/V; has had BM today,+flatus   Allergies: Patient has no known allergies.  Medications: Prior to Admission medications   Medication Sig Start Date End Date Taking? Authorizing Provider  hyoscyamine (LEVSIN SL) 0.125 MG SL tablet Place 0.125 mg under the tongue every 6 (six) hours. 09/19/19  Yes [provider]  ibuprofen (ADVIL) 100 MG/5ML suspension Take 30 mLs by mouth every 6 (six) hours as needed for pain. 09/19/19  Yes [provider]  ondansetron (ZOFRAN-ODT) 4 MG disintegrating tablet Take 4 mg by mouth every 6 (six) hours as needed for nausea/vomiting. 09/19/19  Yes [provider]  pantoprazole (PROTONIX) 40 MG tablet Take 40 mg by mouth daily. 09/19/19  Yes [provider]  cephALEXin (KEFLEX) 500 MG capsule Take 1 capsule (500 mg total) by mouth 3 (three) times daily. Patient not taking: Reported on 09/22/2019 02/23/14   Vivi Barrack, DPM  HYDROcodone-acetaminophen (NORCO/VICODIN) 5-325 MG tablet Take 1-2 tablets by mouth every 4 (four) hours as needed. Patient not taking: Reported on 09/22/2019 12/26/15   Romie Levee, MD  oxyCODONE-acetaminophen (PERCOCET/ROXICET) 5-325 MG tablet Take 1 tablet by mouth every 8 (eight) hours as needed for pain. 09/19/19   [provider]  PROMETHEGAN 25 MG suppository Place 25 mg rectally every 12 (twelve) hours as needed for nausea/vomiting. 09/19/19   [provider]     Vital Signs: BP (!) 132/84 (BP Location: Left Arm)   Pulse 84   Temp 98.2 F (36.8 C) (Oral)   Resp 19   Ht 5\' 10"  (1.778 m)   Wt 290 lb (131.5 kg)   SpO2 94%   BMI 41.61 kg/m   Physical Exam awake/alert; LLQ drain intact, insertion site ok, not sig tender, OP   cc serosang fluid with some tissue debris;  drain flushed without difficulty Imaging: CT ABDOMEN PELVIS W CONTRAST  Result Date: 10/05/2019 CLINICAL DATA:  44 year old male with concern for abdominal infection/abscess. Status post recent Roux-en-Y gastric bypass. EXAM: CT ABDOMEN AND PELVIS WITH CONTRAST TECHNIQUE: Multidetector CT imaging of the abdomen and pelvis was performed using the standard protocol following bolus administration of intravenous contrast. CONTRAST:  55 OMNIPAQUE IOHEXOL 300 MG/ML  SOLN COMPARISON:  CT abdomen pelvis dated 09/29/2019. FINDINGS: Lower chest: Small left and trace right pleural effusion, with slight interval increase in the size of left pleural effusion compared to the prior CT. There are bilateral lower lobe partial consolidative changes which may represent atelectasis or infiltrate. Clinical correlation is recommended. Partially visualized central venous line with tip at the cavoatrial junction. No intra-abdominal free air. There is diffuse mesenteric edema and stranding. Hepatobiliary: Apparent fatty infiltration of the liver. No intrahepatic biliary ductal dilatation. The gallbladder is unremarkable. Pancreas: Unremarkable. No pancreatic ductal dilatation or surrounding inflammatory changes. Spleen: Normal in size without focal abnormality. Adrenals/Urinary Tract: The adrenal glands unremarkable. There is no hydronephrosis on either side. There is symmetric enhancement and excretion of contrast by both kidneys. The visualized ureters appear unremarkable. The urinary bladder is collapsed. Stomach/Bowel: There is postsurgical changes of gastric bypass. Evaluation of the gastrojejunostomy is limited in the absence of oral contrast within the stomach. Several mildly dilated small bowel loops in the left hemiabdomen measure up to 4 cm in caliber. Oral contrast however noted throughout the colon from prior administration. The dilated small bowel loops  in the left hemiabdomen likely represent a degree of ileus. The  appendix is normal. Vascular/Lymphatic: The abdominal aorta and IVC unremarkable. No portal venous gas. There is no adenopathy. Reproductive: The prostate and seminal vesicles are grossly unremarkable. Other: There is a small loculated collection with a small pocket of air along the undersurface of the left lobe of the liver and caudate lobe corresponding to the collection seen on the prior CT. There has been interval decrease in the size of this collection compared to the prior study. Several drainage catheters in the upper abdomen are in similar position. Similarly there has been interval decrease in the size of the loculated collection along the undersurface of the right lobe of the liver measuring approximately 3.3 x 2.1 cm on coronal series 5, image 62 (previously 6.2 x 3.7 cm. There has been interval placement of a pigtail drainage catheter in the loculated collection within the pelvis superior to the bladder. There is significant interval decrease in the size of this collection compared to the prior CT. Several small additional loculated collections noted in the left hemiabdomen and along the small bowel loops. Musculoskeletal: No acute or significant osseous findings. IMPRESSION: 1. Postsurgical changes of gastric bypass. Mildly dilated small bowel loops in the left hemiabdomen likely represent a degree of ileus. No evidence of bowel obstruction. Normal appendix. 2. Interval placement of a pigtail drainage catheter in the loculated collection within the pelvis with significant interval decrease in the size of this collection compared to the prior CT. 3. Interval decrease in the size of additional loculated collections along the undersurface of the liver. 4. Small left and trace right pleural effusion with bibasilar atelectasis or infiltrate. Clinical correlation is recommended. Electronically Signed   By: Elgie Collard M.D.   On: 10/05/2019 19:19    Labs:  CBC: Recent Labs    10/01/19 0500  10/02/19 0353 10/05/19 0337 10/06/19 0400  WBC 15.1* 14.3* 8.7 7.8  HGB 10.7* 11.2* 10.5* 10.7*  HCT 34.3* 35.3* 32.5* 33.7*  PLT 404* 470* 436* 451*    COAGS: Recent Labs    09/22/19 0623 09/29/19 1356  INR 1.1 1.0    BMP: Recent Labs    10/02/19 0353 10/04/19 0424 10/05/19 0337 10/06/19 0400  NA 129* 134* 135 140  K 3.7 4.1 4.3 3.1*  CL 95* 99 99 107  CO2 24 25 23 26   GLUCOSE 148* 148* 138* 89  BUN 14 14 13  <5*  CALCIUM 7.9* 8.4* 8.6* 8.4*  CREATININE 0.87 0.80 0.87 0.57*  GFRNONAA >60 >60 >60 >60  GFRAA >60 >60 >60 >60    LIVER FUNCTION TESTS: Recent Labs    09/28/19 0406 10/01/19 0500 10/02/19 0353 10/05/19 0337  BILITOT 1.4* 1.1 0.6 0.5  AST 38 21 18 23   ALT 54* 41 35 36  ALKPHOS 222* 266* 280* 311*  PROT 6.2* 7.1 6.9 7.0  ALBUMIN 2.2* 2.5* 2.5* 2.6*    Assessment and Plan: Pts/p outpatient lap Roux-en-Y bypass surgery in [place] 09/19/19; s/p diag lap with placement of 2 surgical drains 7/17 for post op leak at GJ site; s/p drainage of LLQ/pelvic fluid collection 7/23; drain fl cx- rare enterobacter; afebrile,WBC nl, hgb stable, K 3.1- replace;  creat 0.57; f/u CT A/P yesterday: 1. Postsurgical changes of gastric bypass. Mildly dilated small bowel loops in the left hemiabdomen likely represent a degree of ileus. No evidence of bowel obstruction. Normal appendix. 2. Interval placement of a pigtail drainage catheter in the loculated collection within the  pelvis with significant interval decrease in the size of this collection compared to the prior CT. 3. Interval decrease in the size of additional loculated collections along the undersurface of the liver. 4. Small left and trace right pleural effusion with bibasilar atelectasis or infiltrate.  For UGI today per [facility] to eval for leak; cont drain/output monitoring  Electronically Signed: D. Jeananne Rama, PA-C 10/06/2019, 9:42 AM - I spent a total of 15 minutes at the the patient's bedside AND on the  patient's hospital floor or unit, greater than 50% of which was counseling/coordinating care for left abdominal abscess drain    Patient ID: Brad Hughes, male   DOB: [birth year], 44 y.o.   MRN: [id number]

## 2019-10-07 LAB — BASIC METABOLIC PANEL
Anion gap: 13 (ref 5–15)
BUN: 15 mg/dL (ref 6–20)
CO2: 24 mmol/L (ref 22–32)
Calcium: 8.9 mg/dL (ref 8.9–10.3)
Chloride: 99 mmol/L (ref 98–111)
Creatinine, Ser: 0.87 mg/dL (ref 0.61–1.24)
GFR calc Af Amer: >60 mL/min (ref >60)
GFR calc non Af Amer: >60 mL/min (ref >60)
Glucose, Bld: 148 mg/dL — ABNORMAL HIGH (ref 70–99)
Potassium: 4.1 mmol/L (ref 3.5–5.1)
Sodium: 136 mmol/L (ref 135–145)

## 2019-10-07 LAB — GLUCOSE, CAPILLARY: Glucose-Capillary: 128 mg/dL — ABNORMAL HIGH (ref 70–99)

## 2019-10-07 LAB — TRIGLYCERIDES: Triglycerides: 259 mg/dL — ABNORMAL HIGH (ref <150)

## 2019-10-07 MED ORDER — SODIUM CHLORIDE 0.9 % IV SOLN: INTRAVENOUS | Status: DC

## 2019-10-07 MED ORDER — INSULIN ASPART 100 UNIT/ML ~~LOC~~ SOLN
0.0000 [IU] | Freq: Three times a day (TID) | SUBCUTANEOUS | Status: DC
  Administered 2019-10-07 – 2019-10-09 (×5): 1 [IU] via SUBCUTANEOUS

## 2019-10-07 MED ORDER — TRAVASOL 10 % IV SOLN
INTRAVENOUS | Status: AC
  Filled 2019-10-07: qty 1272

## 2019-10-07 NOTE — Progress Notes (Signed)
PHARMACY - TOTAL PARENTERAL NUTRITION CONSULT NOTE   Indication: post-op bowel rest  Patient Measurements: Height: '5\' 10"'  (177.8 cm) Weight: 131.5 kg (290 lb) IBW/kg (Calculated) : 73 TPN AdjBW (KG): 87.6 Body mass index is 41.61 kg/m. Usual Weight: 131.5kg Adj body weight: ~ 100 kg  Assessment:  58 yoM admit 7/16 with post-op leak at [place] anastomosis (surgery 7/12), Expl Lap 7/17, drains placed - Peritonitis, on Zosyn 3.375gm q8h,  Eraxis 7/20-7/28  Glucose / Insulin: no hx DM, A1c 5.6, CBGs well controlled (range 130-148; goal 100-150) - 3 units SSI in 24 hrs - TPN currently with 40 units of insulin in bag  Electrolytes: Na up to 140 with Max Na in TPN; K 3.1   Renal: SCr, BUN, bicarb stable WNL; UOP low but incompletely charted; looks adequate per RN Hepatic: AST/ALT stable WNL; Alk phos rising, now 2.5 ULN (7/29); albumin remains low TGs: Elevated on D2 TPN, no baseline obtained so unknown if this was entirely d/t TPN; peaked at 443 on 7/20, now stable around 260 - Lipids removed 7/19, added back at 10 g/L on 7/23; increased to 15 g/L on 7/26 Prealbumin: remains low but improved since initiation of TPN Intake / Output: UOP incompletely charted, drain OP up from yesterday, but overall improved from previous week MIVF: D5-1/2 NS w/ 20 mEq KCl at 25 ml/hr GI Imaging:  - 7/16 Chest/Abd CT: no PE, possible aspiration/lungs, pneumoperitoneum/gas, ascites, severe hepatic steatosis, mild diverticulosis - no inflammation - 7/29 CT: interval decrease in size of fluid collections s/p drain placement, and evaluation of gastrojejunostomy is limited in the absence of oral contrast within the stomach Surgeries / Procedures:  - 7/13 Roux-en-Y bypass in [place], was d/c same day - 7/17 Expl Lap at [facility] - repair anastomosis  Central access: PICC 7/18 TPN start date: 7/18  Nutritional Goals (RD recommendations on 7/26 ): KCal: 2300-2500 , Protein: 115-130, Fluid: >/= 2.5 L/day Goal TPN rate is 100  mL/hr (provides 120  g of protein and 2388 kcals per day with lipids, 1908 kcals per day w/o lipids)  Current Nutrition:  NPO, aspiration precautions  Plan:    Continue TPN at goal rate  of 100 ml/hr  provides 127 gm protein and 2297 Kcal daily, meeting 100% nutritional goals  Electrolytes in TPN: increase Na, K  Na 140 mEq/L  K 70 mEq/L  Ca 5 mEq/L  Mg 5 mEq/L  Phos 10 mmol/L  Cl:Ac ratio 1:1  Standard MVI and trace elements to TPN  Reduce sSSI to q8hr CBG checks  Continue insulin 40 units to TPN  Change MIVF to NS at 20 ml/hr  Monitor TPN labs on Mon/Thurs  BMET & triglycerides tomorrow  Reuel Boom, PharmD, BCPS [phone] 10/07/2019, 10:59 AM

## 2019-10-07 NOTE — Progress Notes (Signed)
14 Days Post-Op   Subjective/Chief Complaint: Pt doing well this AM Tol clears   Objective: Vital signs in last 24 hours: Temp:  [98 F (36.7 C)-98.6 F (37 C)] 98 F (36.7 C) (07/31 0634) Pulse Rate:  [85-93] 86 (07/31 0634) Resp:  [18] 18 (07/31 0634) BP: (120-132)/(71-82) 120/82 (07/31 0634) SpO2:  [95 %-99 %] 97 % (07/31 0634) Last BM Date: 10/06/19  Intake/Output from previous day: 07/30 0701 - 07/31 0700 In: 3270.2 [P.O.:120; I.V.:2786.2; IV Piggyback:363.9] Out: 732 [Urine:701; Drains:30; Stool:1] Intake/Output this shift: No intake/output data recorded.   PE:  Constitutional: No acute distress, conversant, appears states age. Eyes: Anicteric sclerae, moist conjunctiva, no lid lag Lungs: Clear to auscultation bilaterally, normal respiratory effort CV: regular rate and rhythm, no murmurs, no peripheral edema, pedal pulses 2+ GI: Soft, no masses or hepatosplenomegaly, non-tender to palpation, Drain SS Skin: No rashes, palpation reveals normal turgor Psychiatric: appropriate judgment and insight, oriented to person, place, and time   Lab Results:  Recent Labs    10/05/19 0337 10/06/19 0400  WBC 8.7 7.8  HGB 10.5* 10.7*  HCT 32.5* 33.7*  PLT 436* 451*   BMET Recent Labs    10/06/19 0400 10/07/19 0509  NA 140 136  K 3.1* 4.1  CL 107 99  CO2 26 24  GLUCOSE 89 148*  BUN <5* 15  CREATININE 0.57* 0.87  CALCIUM 8.4* 8.9   PT/INR No results for input(s): LABPROT, INR in the last 72 hours. ABG No results for input(s): PHART, HCO3 in the last 72 hours.  Invalid input(s): PCO2, PO2  Studies/Results: CT ABDOMEN PELVIS W CONTRAST  Result Date: 10/05/2019 CLINICAL DATA:  44 year old male with concern for abdominal infection/abscess. Status post recent Roux-en-Y gastric bypass. EXAM: CT ABDOMEN AND PELVIS WITH CONTRAST TECHNIQUE: Multidetector CT imaging of the abdomen and pelvis was performed using the standard protocol following bolus administration of  intravenous contrast. CONTRAST:  OMNIPAQUE IOHEXOL 300 MG/ML  SOLN COMPARISON:  CT abdomen pelvis dated 09/29/2019. FINDINGS: Lower chest: Small left and trace right pleural effusion, with slight interval increase in the size of left pleural effusion compared to the prior CT. There are bilateral lower lobe partial consolidative changes which may represent atelectasis or infiltrate. Clinical correlation is recommended. Partially visualized central venous line with tip at the cavoatrial junction. No intra-abdominal free air. There is diffuse mesenteric edema and stranding. Hepatobiliary: Apparent fatty infiltration of the liver. No intrahepatic biliary ductal dilatation. The gallbladder is unremarkable. Pancreas: Unremarkable. No pancreatic ductal dilatation or surrounding inflammatory changes. Spleen: Normal in size without focal abnormality. Adrenals/Urinary Tract: The adrenal glands unremarkable. There is no hydronephrosis on either side. There is symmetric enhancement and excretion of contrast by both kidneys. The visualized ureters appear unremarkable. The urinary bladder is collapsed. Stomach/Bowel: There is postsurgical changes of gastric bypass. Evaluation of the gastrojejunostomy is limited in the absence of oral contrast within the stomach. Several mildly dilated small bowel loops in the left hemiabdomen measure up to 4 cm in caliber. Oral contrast however noted throughout the colon from prior administration. The dilated small bowel loops in the left hemiabdomen likely represent a degree of ileus. The appendix is normal. Vascular/Lymphatic: The abdominal aorta and IVC unremarkable. No portal venous gas. There is no adenopathy. Reproductive: The prostate and seminal vesicles are grossly unremarkable. Other: There is a small loculated collection with a small pocket of air along the undersurface of the left lobe of the liver and caudate lobe corresponding to the collection  seen on the prior CT. There has  been interval decrease in the size of this collection compared to the prior study. Several drainage catheters in the upper abdomen are in similar position. Similarly there has been interval decrease in the size of the loculated collection along the undersurface of the right lobe of the liver measuring approximately 3.3 x 2.1 cm on coronal series 5, image 62 (previously 6.2 x 3.7 cm. There has been interval placement of a pigtail drainage catheter in the loculated collection within the pelvis superior to the bladder. There is significant interval decrease in the size of this collection compared to the prior CT. Several small additional loculated collections noted in the left hemiabdomen and along the small bowel loops. Musculoskeletal: No acute or significant osseous findings. IMPRESSION: 1. Postsurgical changes of gastric bypass. Mildly dilated small bowel loops in the left hemiabdomen likely represent a degree of ileus. No evidence of bowel obstruction. Normal appendix. 2. Interval placement of a pigtail drainage catheter in the loculated collection within the pelvis with significant interval decrease in the size of this collection compared to the prior CT. 3. Interval decrease in the size of additional loculated collections along the undersurface of the liver. 4. Small left and trace right pleural effusion with bibasilar atelectasis or infiltrate. Clinical correlation is recommended. Electronically Signed   By: Elgie Collard M.D.   On: 10/05/2019 19:19   DG UGI W SINGLE CM (SOL OR THIN BA)  Result Date: 10/06/2019 CLINICAL DATA:  Gastric bypass and Roux-en-Y 09/19/2019. Bowel leak following surgery with intra-abdominal abscesses. Status post abscess drainage. EXAM: WATER SOLUBLE UPPER GI SERIES TECHNIQUE: Single-column upper GI series was performed using water soluble contrast. COMPARISON:  CT abdomen pelvis 10/05/2019, 09/29/2019, 09/22/2019 FLUOROSCOPY TIME:  Fluoroscopy Time:  2 minutes 30 second  Radiation Exposure Index (if provided by the fluoroscopic device): Number of Acquired Spot Images: 0 FINDINGS: Esophageal mucosa and motility normal. Postop gastric bypass with Roux-en-Y appears appropriate post surgery. There is normal emptying into the Roux-en-Y. No leak identified. Contrast was followed through proximal small bowel without leak. There is dilatation of the jejunum similar to that seen on prior CT. Pigtail drain in the pelvis.  Surgical drains in the upper abdomen. IMPRESSION: Negative for leak.  Postop gastrectomy and Roux-en-Y. Dilatation of the jejunum compatible with postop ileus versus obstruction. Electronically Signed   By: Marlan Palau M.D.   On: 10/06/2019 13:48    Anti-infectives: Anti-infectives (From admission, onward)   Start     Dose/Rate Route Frequency Ordered Stop   09/27/19 1000  anidulafungin (ERAXIS) 100 mg in sodium chloride 0.9 % 100 mL IVPB  Status:  Discontinued        100 mg 78 mL/hr over 100 Minutes Intravenous Every 24 hours 09/26/19 0819 10/04/19 1614   09/26/19 1000  anidulafungin (ERAXIS) 200 mg in sodium chloride 0.9 % 200 mL IVPB        200 mg 78 mL/hr over 200 Minutes Intravenous  Once 09/26/19 0819 09/26/19 1334   09/24/19 0730  piperacillin-tazobactam (ZOSYN) IVPB 3.375 g     Discontinue    Note to Pharmacy: Pharmacy to check dosing   3.375 g 12.5 mL/hr over 240 Minutes Intravenous Every 8 hours 09/24/19 0722     09/24/19 0600  cefoTEtan (CEFOTAN) 2 g in sodium chloride 0.9 % 100 mL IVPB        2 g 200 mL/hr over 30 Minutes Intravenous On call to O.R. 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 Maisie Fus Cornett/Dr. Kandis Ban - S/pIR drain placementleft pelvis 09/29/19 >> cx: Enterobacter aerogenes. Continue zosyn - continue drain x3 and monitor output - CT 7/29 shows interval decrease in size of fluid collections s/p drain placement, and evaluation of gastrojejunostomy is limited in the absence of oral contrast within the stomach. - Continue Clears & TPN(monitor LFT's, slightly elevated)  FEN: N.p.o./TNA ID: Zosyn 7/18 >>day#13 DVT: SCDs,Lovenox Follow up: TBD  Plan: Con't clear and TNA for now until on a normal diet. Continue drains and IV antibiotics.   LOS: 14 days    Axel Filler 10/07/2019

## 2019-10-08 DIAGNOSIS — Z79899 Other long term (current) drug therapy: Secondary | ICD-10-CM | POA: Diagnosis not present

## 2019-10-08 DIAGNOSIS — N179 Acute kidney failure, unspecified: Secondary | ICD-10-CM | POA: Diagnosis present

## 2019-10-08 DIAGNOSIS — K651 Peritoneal abscess: Secondary | ICD-10-CM | POA: Diagnosis present

## 2019-10-08 DIAGNOSIS — R188 Other ascites: Secondary | ICD-10-CM | POA: Diagnosis present

## 2019-10-08 DIAGNOSIS — K9589 Other complications of other bariatric procedure: Secondary | ICD-10-CM | POA: Diagnosis present

## 2019-10-08 DIAGNOSIS — R000 Tachycardia, unspecified: Secondary | ICD-10-CM | POA: Diagnosis present

## 2019-10-08 DIAGNOSIS — G4733 Obstructive sleep apnea (adult) (pediatric): Secondary | ICD-10-CM | POA: Diagnosis present

## 2019-10-08 DIAGNOSIS — Z6841 Body Mass Index (BMI) 40.0 and over, adult: Secondary | ICD-10-CM | POA: Diagnosis not present

## 2019-10-08 DIAGNOSIS — A419 Sepsis, unspecified organism: Secondary | ICD-10-CM | POA: Diagnosis present

## 2019-10-08 DIAGNOSIS — Z791 Long term (current) use of non-steroidal anti-inflammatories (NSAID): Secondary | ICD-10-CM | POA: Diagnosis not present

## 2019-10-08 DIAGNOSIS — K650 Generalized (acute) peritonitis: Secondary | ICD-10-CM | POA: Diagnosis present

## 2019-10-08 DIAGNOSIS — K760 Fatty (change of) liver, not elsewhere classified: Secondary | ICD-10-CM | POA: Diagnosis present

## 2019-10-08 DIAGNOSIS — K5730 Diverticulosis of large intestine without perforation or abscess without bleeding: Secondary | ICD-10-CM | POA: Diagnosis present

## 2019-10-08 DIAGNOSIS — E860 Dehydration: Secondary | ICD-10-CM | POA: Diagnosis present

## 2019-10-08 DIAGNOSIS — Z79891 Long term (current) use of opiate analgesic: Secondary | ICD-10-CM | POA: Diagnosis not present

## 2019-10-08 DIAGNOSIS — E785 Hyperlipidemia, unspecified: Secondary | ICD-10-CM | POA: Diagnosis present

## 2019-10-08 DIAGNOSIS — R109 Unspecified abdominal pain: Secondary | ICD-10-CM | POA: Diagnosis present

## 2019-10-08 DIAGNOSIS — E43 Unspecified severe protein-calorie malnutrition: Secondary | ICD-10-CM | POA: Diagnosis present

## 2019-10-08 DIAGNOSIS — R739 Hyperglycemia, unspecified: Secondary | ICD-10-CM | POA: Diagnosis not present

## 2019-10-08 DIAGNOSIS — Z87891 Personal history of nicotine dependence: Secondary | ICD-10-CM | POA: Diagnosis not present

## 2019-10-08 DIAGNOSIS — Z20822 Contact with and (suspected) exposure to covid-19: Secondary | ICD-10-CM | POA: Diagnosis present

## 2019-10-08 DIAGNOSIS — J9811 Atelectasis: Secondary | ICD-10-CM | POA: Diagnosis present

## 2019-10-08 DIAGNOSIS — J9601 Acute respiratory failure with hypoxia: Secondary | ICD-10-CM | POA: Diagnosis not present

## 2019-10-08 DIAGNOSIS — Z9884 Bariatric surgery status: Secondary | ICD-10-CM | POA: Diagnosis not present

## 2019-10-08 DIAGNOSIS — Y832 Surgical operation with anastomosis, bypass or graft as the cause of abnormal reaction of the patient, or of later complication, without mention of misadventure at the time of the procedure: Secondary | ICD-10-CM | POA: Diagnosis present

## 2019-10-08 LAB — BASIC METABOLIC PANEL
Anion gap: 8 (ref 5–15)
BUN: 16 mg/dL (ref 6–20)
CO2: 26 mmol/L (ref 22–32)
Calcium: 8.6 mg/dL — ABNORMAL LOW (ref 8.9–10.3)
Chloride: 100 mmol/L (ref 98–111)
Creatinine, Ser: 0.88 mg/dL (ref 0.61–1.24)
GFR calc Af Amer: >60 mL/min (ref >60)
GFR calc non Af Amer: >60 mL/min (ref >60)
Glucose, Bld: 130 mg/dL — ABNORMAL HIGH (ref 70–99)
Potassium: 4.2 mmol/L (ref 3.5–5.1)
Sodium: 134 mmol/L — ABNORMAL LOW (ref 135–145)

## 2019-10-08 LAB — GLUCOSE, CAPILLARY
Glucose-Capillary: 124 mg/dL — ABNORMAL HIGH (ref 70–99)
Glucose-Capillary: 142 mg/dL — ABNORMAL HIGH (ref 70–99)

## 2019-10-08 LAB — TRIGLYCERIDES: Triglycerides: 274 mg/dL — ABNORMAL HIGH (ref <150)

## 2019-10-08 MED ORDER — DEXTROSE 10 % IV SOLN: INTRAVENOUS | Status: AC

## 2019-10-08 MED ORDER — TRAVASOL 10 % IV SOLN
INTRAVENOUS | Status: DC
  Filled 2019-10-08: qty 1272

## 2019-10-08 NOTE — Progress Notes (Signed)
[facility] and [facility] pharmacists notified of TNA.  D10 hung and infusing in place.  Shanon RN aware to notify MD. Jillene Bucks Nurse aware of  Interventions.

## 2019-10-08 NOTE — Progress Notes (Signed)
15 Days Post-Op   Subjective/Chief Complaint: Pt with no acute changes Doing well with clears   Objective: Vital signs in last 24 hours: Temp:  [97.9 F (36.6 C)-98.7 F (37.1 C)] 97.9 F (36.6 C) (08/01 0559) Pulse Rate:  [84-94] 84 (08/01 0559) Resp:  [18-19] 19 (08/01 0559) BP: (111-126)/(76-79) 126/79 (08/01 0559) SpO2:  [93 %-100 %] 95 % (08/01 0559) Last BM Date: 10/07/19  Intake/Output from previous day: 07/31 0701 - 08/01 0700 In: 3032 [P.O.:60; I.V.:2723.6; IV Piggyback:248.3] Out: 1710 [Urine:1700; Drains:10] Intake/Output this shift: No intake/output data recorded.  General appearance: alert and cooperative GI: soft, non-tender; bowel sounds normal; no masses,  no organomegaly  Lab Results:  Recent Labs    10/06/19 0400  WBC 7.8  HGB 10.7*  HCT 33.7*  PLT 451*   BMET Recent Labs    10/07/19 0509 10/08/19 0415  NA 136 134*  K 4.1 4.2  CL 99 100  CO2 24 26  GLUCOSE 148* 130*  BUN 15 16  CREATININE 0.87 0.88  CALCIUM 8.9 8.6*   PT/INR No results for input(s): LABPROT, INR in the last 72 hours. ABG No results for input(s): PHART, HCO3 in the last 72 hours.  Invalid input(s): PCO2, PO2  Studies/Results: DG UGI W SINGLE CM (SOL OR THIN BA)  Result Date: 10/06/2019 CLINICAL DATA:  Gastric bypass and Roux-en-Y 09/19/2019. Bowel leak following surgery with intra-abdominal abscesses. Status post abscess drainage. EXAM: WATER SOLUBLE UPPER GI SERIES TECHNIQUE: Single-column upper GI series was performed using water soluble contrast. COMPARISON:  CT abdomen pelvis 10/05/2019, 09/29/2019, 09/22/2019 FLUOROSCOPY TIME:  Fluoroscopy Time:  2 minutes 30 second Radiation Exposure Index (if provided by the fluoroscopic device): Number of Acquired Spot Images: 0 FINDINGS: Esophageal mucosa and motility normal. Postop gastric bypass with Roux-en-Y appears appropriate post surgery. There is normal emptying into the Roux-en-Y. No leak identified. Contrast was  followed through proximal small bowel without leak. There is dilatation of the jejunum similar to that seen on prior CT. Pigtail drain in the pelvis.  Surgical drains in the upper abdomen. IMPRESSION: Negative for leak.  Postop gastrectomy and Roux-en-Y. Dilatation of the jejunum compatible with postop ileus versus obstruction. Electronically Signed   By: Marlan Palau M.D.   On: 10/06/2019 13:48    Anti-infectives: Anti-infectives (From admission, onward)   Start     Dose/Rate Route Frequency Ordered Stop   09/27/19 1000  anidulafungin (ERAXIS) 100 mg in sodium chloride 0.9 % 100 mL IVPB  Status:  Discontinued        100 mg 78 mL/hr over 100 Minutes Intravenous Every 24 hours 09/26/19 0819 10/04/19 1614   09/26/19 1000  anidulafungin (ERAXIS) 200 mg in sodium chloride 0.9 % 200 mL IVPB        200 mg 78 mL/hr over 200 Minutes Intravenous  Once 09/26/19 0819 09/26/19 1334   09/24/19 0730  piperacillin-tazobactam (ZOSYN) IVPB 3.375 g     Discontinue    Note to Pharmacy: Pharmacy to check dosing   3.375 g 12.5 mL/hr over 240 Minutes Intravenous Every 8 hours 09/24/19 0722     09/24/19 0600  cefoTEtan (CEFOTAN) 2 g in sodium chloride 0.9 % 100 mL IVPB        2 g 200 mL/hr over 30 Minutes Intravenous On call to O.R. 09/23/19 1735 09/23/19 1830   09/22/19 0915  piperacillin-tazobactam (ZOSYN) IVPB 3.375 g        3.375 g 100 mL/hr over 30 Minutes Intravenous  Once  09/22/19 0906 09/22/19 1001      Assessment/Plan: s/p Procedure(s): LAPAROSCOPY DIAGNOSTIC, EGD, Drainage of intra-abdominal abscess (N/A) Advance diet to FLD Con't TNA IV Abx Mobilize  LOS: 15 days    Brad Hughes 10/08/2019

## 2019-10-08 NOTE — Progress Notes (Signed)
PHARMACY - TOTAL PARENTERAL NUTRITION CONSULT NOTE   Indication: post-op bowel rest  Patient Measurements: Height: '5\' 10"'  (177.8 cm) Weight: 131.5 kg (290 lb) IBW/kg (Calculated) : 73 TPN AdjBW (KG): 87.6 Body mass index is 41.61 kg/m. Usual Weight: 131.5kg Adj body weight: ~ 100 kg  Assessment:  17 yoM admit 7/16 with post-op leak at [place] anastomosis (surgery 7/12), Expl Lap 7/17, drains placed - Peritonitis, on Zosyn 3.375gm q8h,  Eraxis 7/20-7/28  Glucose / Insulin: no hx DM, A1c 5.6, CBGs well controlled on current insulin/TPN regimen - 4 units SSI in 24 hrs - TPN delivers 40 units of insulin/day Electrolytes: Na dropping again after backing off Na in TPN slightly; other labs stable WNL   Renal: SCr, BUN, bicarb stable WNL; UOP low but incompletely charted; looks adequate per RN Hepatic (7/29): AST/ALT stable WNL; Alk phos rising, now 2.5 ULN (7/29); albumin remains low TGs (8/1): Elevated on D2 TPN, no baseline obtained so unknown if this was entirely d/t TPN; peaked at 443 on 7/20, recovered to 260, now slowly rising again - Lipids removed 7/19, added back at 10 g/L on 7/23; increased to 15 g/L on 7/26 Prealbumin (7/26): remains low but improved since initiation of TPN Intake / Output: UOP incompletely charted, minimal drain OP MIVF: NS at 20 ml/hr GI Imaging:  - 7/16 Chest/Abd CT: no PE, possible aspiration/lungs, pneumoperitoneum/gas, ascites, severe hepatic steatosis, mild diverticulosis - no inflammation - 7/29 CT: interval decrease in size of fluid collections s/p drain placement, and evaluation of gastrojejunostomy is limited in the absence of oral contrast within the stomach - 7/30 UGI: postop ileus vs obstruction; no leakage noted at Roux-en-Y Surgeries / Procedures:  - 7/13 Roux-en-Y bypass in [place], was d/c same day - 7/17 Expl Lap at [facility] - repair anastomosis  Central access: PICC 7/18 TPN start date: 7/18  Nutritional Goals (RD recommendations on 7/26 ): KCal:  2300-2500 , Protein: 115-130, Fluid: >/= 2.5 L/day Goal TPN rate is 100 mL/hr (provides 120  g of protein and 2388 kcals per day with lipids, 1908 kcals per day w/o lipids)  Current Nutrition:  CLD, no supplements ordered   Plan:    Continue TPN at goal rate  of 100 ml/hr  provides 127 gm protein and 2297 Kcal daily, meeting 100% nutritional goals  Electrolytes in TPN: increase Na  Na 150 mEq/L  K 70 mEq/L  Ca 5 mEq/L  Mg 5 mEq/L  Phos 10 mmol/L  Cl:Ac ratio 1:1  Standard MVI and trace elements to TPN  Continue sensitive SSI with q8hr CBG checks  Continue insulin 40 units in TPN  Continue NS at 20 ml/hr  Monitor TPN labs on Mon/Thurs - f/u TG closely; may need daily monitoring  Reuel Boom, PharmD, BCPS [phone] 10/08/2019, 8:27 AM

## 2019-10-09 LAB — COMPREHENSIVE METABOLIC PANEL
ALT: 36 U/L (ref 0–44)
AST: 26 U/L (ref 15–41)
Albumin: 2.9 g/dL — ABNORMAL LOW (ref 3.5–5.0)
Alkaline Phosphatase: 208 U/L — ABNORMAL HIGH (ref 38–126)
Anion gap: 13 (ref 5–15)
BUN: 15 mg/dL (ref 6–20)
CO2: 24 mmol/L (ref 22–32)
Calcium: 8.9 mg/dL (ref 8.9–10.3)
Chloride: 98 mmol/L (ref 98–111)
Creatinine, Ser: 1.06 mg/dL (ref 0.61–1.24)
GFR calc Af Amer: >60 mL/min (ref >60)
GFR calc non Af Amer: >60 mL/min (ref >60)
Glucose, Bld: 126 mg/dL — ABNORMAL HIGH (ref 70–99)
Potassium: 4.1 mmol/L (ref 3.5–5.1)
Sodium: 135 mmol/L (ref 135–145)
Total Bilirubin: 0.7 mg/dL (ref 0.3–1.2)
Total Protein: 7.2 g/dL (ref 6.5–8.1)

## 2019-10-09 LAB — DIFFERENTIAL
Abs Immature Granulocytes: 0.05 10*3/uL (ref 0.00–0.07)
Basophils Absolute: 0 10*3/uL (ref 0.0–0.1)
Basophils Relative: 0 %
Eosinophils Absolute: 0.2 10*3/uL (ref 0.0–0.5)
Eosinophils Relative: 3 %
Immature Granulocytes: 1 %
Lymphocytes Relative: 17 %
Lymphs Abs: 1.1 10*3/uL (ref 0.7–4.0)
Monocytes Absolute: 0.6 10*3/uL (ref 0.1–1.0)
Monocytes Relative: 9 %
Neutro Abs: 4.6 10*3/uL (ref 1.7–7.7)
Neutrophils Relative %: 70 %

## 2019-10-09 LAB — CBC
HCT: 32.6 % — ABNORMAL LOW (ref 39.0–52.0)
Hemoglobin: 10.5 g/dL — ABNORMAL LOW (ref 13.0–17.0)
MCH: 28 pg (ref 26.0–34.0)
MCHC: 32.2 g/dL (ref 30.0–36.0)
MCV: 86.9 fL (ref 80.0–100.0)
Platelets: 311 10*3/uL (ref 150–400)
RBC: 3.75 MIL/uL — ABNORMAL LOW (ref 4.22–5.81)
RDW: 12.8 % (ref 11.5–15.5)
WBC: 6.5 10*3/uL (ref 4.0–10.5)
nRBC: 0 % (ref 0.0–0.2)

## 2019-10-09 LAB — MAGNESIUM: Magnesium: 2.1 mg/dL (ref 1.7–2.4)

## 2019-10-09 LAB — GLUCOSE, CAPILLARY
Glucose-Capillary: 122 mg/dL — ABNORMAL HIGH (ref 70–99)
Glucose-Capillary: 110 mg/dL — ABNORMAL HIGH (ref 70–99)

## 2019-10-09 LAB — TRIGLYCERIDES: Triglycerides: 275 mg/dL — ABNORMAL HIGH (ref <150)

## 2019-10-09 LAB — PREALBUMIN: Prealbumin: 20.1 mg/dL (ref 18–38)

## 2019-10-09 LAB — PHOSPHORUS: Phosphorus: 4.7 mg/dL — ABNORMAL HIGH (ref 2.5–4.6)

## 2019-10-09 MED ORDER — ENSURE MAX PROTEIN PO LIQD
11.0000 [oz_av] | Freq: Two times a day (BID) | ORAL | Status: DC
  Administered 2019-10-09 – 2019-10-12 (×6): 11 [oz_av] via ORAL

## 2019-10-09 MED ORDER — INSULIN ASPART 100 UNIT/ML ~~LOC~~ SOLN
0.0000 [IU] | Freq: Four times a day (QID) | SUBCUTANEOUS | Status: AC
  Administered 2019-10-10 (×3): 2 [IU] via SUBCUTANEOUS

## 2019-10-09 MED ORDER — TRAVASOL 10 % IV SOLN
INTRAVENOUS | Status: AC
  Filled 2019-10-09: qty 1272

## 2019-10-09 NOTE — Progress Notes (Signed)
Nutrition Follow-up  DOCUMENTATION CODES:   Morbid obesity  INTERVENTION:   -TPN management per Pharmacy -Ensure MAX Protein po BID, each supplement provides 150 kcal and 30 grams of protein  NUTRITION DIAGNOSIS:   Inadequate oral intake related to inability to eat as evidenced by NPO status.  Now on fulls  GOAL:   Patient will meet greater than or equal to 90% of their needs  Progressing.  MONITOR:   Diet advancement, Labs, Weight trends, Other (Comment) (TPN regimen)  ASSESSMENT:   44 year-old male with medical hx of obesity, HLD, anal fistula. He underwent Roux-en-Y gastric bypass in [place] on 7/14 and presented to the [facility] ED on 7/16 due to uncontrolled abdominal pain. He drove back from [place] on 7/15.  7/17: s/p laparoscopy with placement of drainage tubes and I&D of intra-abdominal abscess and EGD 7/18: PICC placed, TPN initiated 7/23: s/p IR drain placement 7/31: diet advanced to clears 8/2: diet advanced to fulls  Diet now full liquids. Pt was consuming  75-100% of clears yesterday. Will order Ensure Max supplements for additional protein.   Pt is currently receiving D10 infusion d/t tubing issues. TPN to resume today.  TPN @ 100 ml/hr (providing 2296 kcals and 127g protein).   Admission weight: 290 lbs. Current weight: 280 lbs.   Medications: D10 infusion  Labs reviewed: CBGs: 122-142 Elevated Phos Mg WNL TG: 275  Diet Order:   Diet Order            Diet full liquid Room service appropriate? Yes; Fluid consistency: Thin  Diet effective now                 EDUCATION NEEDS:   Not appropriate for education at this time  Skin:  Skin Assessment: Reviewed RN Assessment  Last BM:  8/1  Height:   Ht Readings from Last 1 Encounters:  10/09/19 5\' 10"  (1.778 m)    Weight:   Wt Readings from Last 1 Encounters:  10/09/19 (!) 127.4 kg    BMI:  Body mass index is 40.29 kg/m.  Estimated Nutritional Needs:   Kcal:  2300-2500  kcal  Protein:  115-130 grams  Fluid:  >/= 2.5 L/day  12/09/19, MS, RD, LDN Inpatient Clinical Dietitian Contact information available via Amion

## 2019-10-09 NOTE — Progress Notes (Signed)
Referring Physician(s): Newman,D  Supervising Physician: Malachy Moan  Patient Status:  [facility] - In-pt  Chief Complaint: Abdominal pain/fluid collections   Subjective: Patient without acute changes; reports some minimal epigastric discomfort, no nausea or vomiting.   Allergies: Patient has no known allergies.  Medications: Prior to Admission medications   Medication Sig Start Date End Date Taking? Authorizing Provider  hyoscyamine (LEVSIN SL) 0.125 MG SL tablet Place 0.125 mg under the tongue every 6 (six) hours. 09/19/19  Yes [provider]  ibuprofen (ADVIL) 100 MG/5ML suspension Take 30 mLs by mouth every 6 (six) hours as needed for pain. 09/19/19  Yes [provider]  ondansetron (ZOFRAN-ODT) 4 MG disintegrating tablet Take 4 mg by mouth every 6 (six) hours as needed for nausea/vomiting. 09/19/19  Yes [provider]  pantoprazole (PROTONIX) 40 MG tablet Take 40 mg by mouth daily. 09/19/19  Yes [provider]  cephALEXin (KEFLEX) 500 MG capsule Take 1 capsule (500 mg total) by mouth 3 (three) times daily. Patient not taking: Reported on 09/22/2019 02/23/14   Vivi Barrack, DPM  HYDROcodone-acetaminophen (NORCO/VICODIN) 5-325 MG tablet Take 1-2 tablets by mouth every 4 (four) hours as needed. Patient not taking: Reported on 09/22/2019 12/26/15   Romie Levee, MD  oxyCODONE-acetaminophen (PERCOCET/ROXICET) 5-325 MG tablet Take 1 tablet by mouth every 8 (eight) hours as needed for pain. 09/19/19   [provider]  PROMETHEGAN 25 MG suppository Place 25 mg rectally every 12 (twelve) hours as needed for nausea/vomiting. 09/19/19   [provider]     Vital Signs: BP 119/74 (BP Location: Left Arm)   Pulse 86   Temp 98 F (36.7 C)   Resp 16   Ht 5\' 10"  (1.778 m)   Wt (!) 280 lb 12.8 oz (127.4 kg)   SpO2 98%   BMI 40.29 kg/m   Physical Exam awake, alert.  Left lower quadrant drain intact, insertion site okay,  minimal tenderness to palpation, output 20 cc serosanguineous fluid  Imaging: CT ABDOMEN PELVIS W CONTRAST  Result Date: 10/05/2019 CLINICAL DATA:  44 year old male with concern for abdominal infection/abscess. Status post recent Roux-en-Y gastric bypass. EXAM: CT ABDOMEN AND PELVIS WITH CONTRAST TECHNIQUE: Multidetector CT imaging of the abdomen and pelvis was performed using the standard protocol following bolus administration of intravenous contrast. CONTRAST:  55 OMNIPAQUE IOHEXOL 300 MG/ML  SOLN COMPARISON:  CT abdomen pelvis dated 09/29/2019. FINDINGS: Lower chest: Small left and trace right pleural effusion, with slight interval increase in the size of left pleural effusion compared to the prior CT. There are bilateral lower lobe partial consolidative changes which may represent atelectasis or infiltrate. Clinical correlation is recommended. Partially visualized central venous line with tip at the cavoatrial junction. No intra-abdominal free air. There is diffuse mesenteric edema and stranding. Hepatobiliary: Apparent fatty infiltration of the liver. No intrahepatic biliary ductal dilatation. The gallbladder is unremarkable. Pancreas: Unremarkable. No pancreatic ductal dilatation or surrounding inflammatory changes. Spleen: Normal in size without focal abnormality. Adrenals/Urinary Tract: The adrenal glands unremarkable. There is no hydronephrosis on either side. There is symmetric enhancement and excretion of contrast by both kidneys. The visualized ureters appear unremarkable. The urinary bladder is collapsed. Stomach/Bowel: There is postsurgical changes of gastric bypass. Evaluation of the gastrojejunostomy is limited in the absence of oral contrast within the stomach. Several mildly dilated small bowel loops in the left hemiabdomen measure up to 4 cm in caliber. Oral contrast however noted throughout the colon from prior administration. The dilated small  bowel loops in the left hemiabdomen likely  represent a degree of ileus. The appendix is normal. Vascular/Lymphatic: The abdominal aorta and IVC unremarkable. No portal venous gas. There is no adenopathy. Reproductive: The prostate and seminal vesicles are grossly unremarkable. Other: There is a small loculated collection with a small pocket of air along the undersurface of the left lobe of the liver and caudate lobe corresponding to the collection seen on the prior CT. There has been interval decrease in the size of this collection compared to the prior study. Several drainage catheters in the upper abdomen are in similar position. Similarly there has been interval decrease in the size of the loculated collection along the undersurface of the right lobe of the liver measuring approximately 3.3 x 2.1 cm on coronal series 5, image 62 (previously 6.2 x 3.7 cm. There has been interval placement of a pigtail drainage catheter in the loculated collection within the pelvis superior to the bladder. There is significant interval decrease in the size of this collection compared to the prior CT. Several small additional loculated collections noted in the left hemiabdomen and along the small bowel loops. Musculoskeletal: No acute or significant osseous findings. IMPRESSION: 1. Postsurgical changes of gastric bypass. Mildly dilated small bowel loops in the left hemiabdomen likely represent a degree of ileus. No evidence of bowel obstruction. Normal appendix. 2. Interval placement of a pigtail drainage catheter in the loculated collection within the pelvis with significant interval decrease in the size of this collection compared to the prior CT. 3. Interval decrease in the size of additional loculated collections along the undersurface of the liver. 4. Small left and trace right pleural effusion with bibasilar atelectasis or infiltrate. Clinical correlation is recommended. Electronically Signed   By: Elgie Collard M.D.   On: 10/05/2019 19:19   DG UGI W SINGLE CM  (SOL OR THIN BA)  Result Date: 10/06/2019 CLINICAL DATA:  Gastric bypass and Roux-en-Y 09/19/2019. Bowel leak following surgery with intra-abdominal abscesses. Status post abscess drainage. EXAM: WATER SOLUBLE UPPER GI SERIES TECHNIQUE: Single-column upper GI series was performed using water soluble contrast. COMPARISON:  CT abdomen pelvis 10/05/2019, 09/29/2019, 09/22/2019 FLUOROSCOPY TIME:  Fluoroscopy Time:  2 minutes 30 second Radiation Exposure Index (if provided by the fluoroscopic device): Number of Acquired Spot Images: 0 FINDINGS: Esophageal mucosa and motility normal. Postop gastric bypass with Roux-en-Y appears appropriate post surgery. There is normal emptying into the Roux-en-Y. No leak identified. Contrast was followed through proximal small bowel without leak. There is dilatation of the jejunum similar to that seen on prior CT. Pigtail drain in the pelvis.  Surgical drains in the upper abdomen. IMPRESSION: Negative for leak.  Postop gastrectomy and Roux-en-Y. Dilatation of the jejunum compatible with postop ileus versus obstruction. Electronically Signed   By: Marlan Palau M.D.   On: 10/06/2019 13:48    Labs:  CBC: Recent Labs    10/02/19 0353 10/05/19 0337 10/06/19 0400 10/09/19 0300  WBC 14.3* 8.7 7.8 6.5  HGB 11.2* 10.5* 10.7* 10.5*  HCT 35.3* 32.5* 33.7* 32.6*  PLT 470* 436* 451* 311    COAGS: Recent Labs    09/22/19 0623 09/29/19 1356  INR 1.1 1.0    BMP: Recent Labs    10/06/19 0400 10/07/19 0509 10/08/19 0415 10/09/19 0300  NA 140 136 134* 135  K 3.1* 4.1 4.2 4.1  CL 107 99 100 98  CO2 26 24 26 24   GLUCOSE 89 148* 130* 126*  BUN <5* 15 16 15  CALCIUM 8.4* 8.9 8.6* 8.9  CREATININE 0.57* 0.87 0.88 1.06  GFRNONAA >60 >60 >60 >60  GFRAA >60 >60 >60 >60    LIVER FUNCTION TESTS: Recent Labs    10/01/19 0500 10/02/19 0353 10/05/19 0337 10/09/19 0300  BILITOT 1.1 0.6 0.5 0.7  AST 21 18 23 26   ALT 41 35 36 36  ALKPHOS 266* 280* 311* 208*  PROT  7.1 6.9 7.0 7.2  ALBUMIN 2.5* 2.5* 2.6* 2.9*    Assessment and Plan: Pts/p outpatient lap Roux-en-Y bypass surgery in [place] 09/19/19; s/p diag lap with placement of 2 surgical drains 7/17 for post op leak at GJ site; s/p drainage of LLQ/pelvic fluid collection 7/23; drain fl cx- rare enterobacter;  afebrile, WBC normal, hemoglobin stable, creatinine normal; upper GI study done on 7/30 was negative for leak; dilatation of jejunum noted compatible with postop ileus versus obstruction; continue current treatment for now   Electronically Signed: D. 8/30, PA-C 10/09/2019, 2:54 PM   I spent a total of 15 minutes at the the patient's bedside AND on the patient's hospital floor or unit, greater than 50% of which was counseling/coordinating care for left abdominal abscess drain    Patient ID: [id number], male   DOB: Apr 26, 1975, 44 y.o.   MRN: [id number]

## 2019-10-09 NOTE — Progress Notes (Signed)
16 Days Post-Op   Subjective/Chief Complaint: Feels better. Complains of discomfort with bm and burning on right big toe   Objective: Vital signs in last 24 hours: Temp:  [98.3 F (36.8 C)-98.5 F (36.9 C)] 98.3 F (36.8 C) (08/02 0558) Pulse Rate:  [89-92] 89 (08/02 0558) Resp:  [16-18] 16 (08/02 0558) BP: (117-133)/(75-79) 122/78 (08/02 0558) SpO2:  [94 %-98 %] 98 % (08/02 0558) Last BM Date: 10/08/19  Intake/Output from previous day: 08/01 0701 - 08/02 0700 In: 4274.2 [P.O.:1200; I.V.:2880.2; IV Piggyback:174] Out: 39 [Drains:39] Intake/Output this shift: No intake/output data recorded.  General appearance: alert and cooperative Resp: clear to auscultation bilaterally Cardio: regular rate and rhythm GI: soft, nontender. drain output getting less cloudy Extremities: toes look normal. good distal pulses  Lab Results:  Recent Labs    10/09/19 0300  WBC 6.5  HGB 10.5*  HCT 32.6*  PLT 311   BMET Recent Labs    10/08/19 0415 10/09/19 0300  NA 134* 135  K 4.2 4.1  CL 100 98  CO2 26 24  GLUCOSE 130* 126*  BUN 16 15  CREATININE 0.88 1.06  CALCIUM 8.6* 8.9   PT/INR No results for input(s): LABPROT, INR in the last 72 hours. ABG No results for input(s): PHART, HCO3 in the last 72 hours.  Invalid input(s): PCO2, PO2  Studies/Results: No results found.  Anti-infectives: Anti-infectives (From admission, onward)   Start     Dose/Rate Route Frequency Ordered Stop   09/27/19 1000  anidulafungin (ERAXIS) 100 mg in sodium chloride 0.9 % 100 mL IVPB  Status:  Discontinued        100 mg 78 mL/hr over 100 Minutes Intravenous Every 24 hours 09/26/19 0819 10/04/19 1614   09/26/19 1000  anidulafungin (ERAXIS) 200 mg in sodium chloride 0.9 % 200 mL IVPB        200 mg 78 mL/hr over 200 Minutes Intravenous  Once 09/26/19 0819 09/26/19 1334   09/24/19 0730  piperacillin-tazobactam (ZOSYN) IVPB 3.375 g     Discontinue    Note to Pharmacy: Pharmacy to check dosing    3.375 g 12.5 mL/hr over 240 Minutes Intravenous Every 8 hours 09/24/19 0722     09/24/19 0600  cefoTEtan (CEFOTAN) 2 g in sodium chloride 0.9 % 100 mL IVPB        2 g 200 mL/hr over 30 Minutes Intravenous On call to O.R. 09/23/19 1735 09/23/19 1830   09/22/19 0915  piperacillin-tazobactam (ZOSYN) IVPB 3.375 g        3.375 g 100 mL/hr over 30 Minutes Intravenous  Once 09/22/19 0906 09/22/19 1001      Assessment/Plan: s/p Procedure(s): LAPAROSCOPY DIAGNOSTIC, EGD, Drainage of intra-abdominal abscess (N/A) Advance diet. Start full liquids today Continue tpn until oral intake improves Ambulate Continue drains and abx  LOS: 16 days    Brad Hughes 10/09/2019

## 2019-10-09 NOTE — Progress Notes (Signed)
PHARMACY - TOTAL PARENTERAL NUTRITION CONSULT NOTE   Indication: post-op bowel rest  Patient Measurements: Height: _0  (177.8 cm) Weight: 131.5 kg (290 lb) IBW/kg (Calculated) : 73 TPN AdjBW (KG): 87.6 Body mass index is 41.61 kg/m. Usual Weight: 131.5kg, ordered updated weight Adj body weight: ~ 100 kg  Assessment:  67 yoM admit 7/16 with post-op leak at [place] anastomosis (s/p Rous-en-Y gastric bypass on 7/14), Expl Lap 7/17 with placement of drainage tubes and I&D of intra-abdominal abscess. Pharmacy consulted for TPN.  Glucose / Insulin: no hx DM, A1c 5.6, CBGs well controlled on current insulin/TPN regimen - 2 units SSI in 24 hrs - TPN delivers 40 units of insulin/day but BG WNL without TPN currently infusing Electrolytes: Phos (4.7) slightly elevated; all other lytes WNL Renal: SCr increased to 1.06; BUN WNL & stable; unmeasured UOP x8 Hepatic (7/29): AST/ALT stable WNL; Alk phos elevated but decreasing (311 > 208); Tbili WNL TGs: Remain elevated but stable (275).  Elevated on D2 TPN, no baseline obtained so unknown if this was entirely d/t TPN; peaked at 443 on 7/20 - Lipids removed 7/19, added back at 10 g/L on 7/23; increased to 15 g/L on 7/26 Prealbumin: Improved to normal (20.1) Intake / Output: UOP incompletely charted, minimal drain OP MIVF: NS at 20 mL/hr GI Imaging:  - 7/16 Chest/Abd CT: no PE, possible aspiration/lungs, pneumoperitoneum/gas, ascites, severe hepatic steatosis, mild diverticulosis - no inflammation - 7/29 CT: interval decrease in size of fluid collections s/p drain placement, and evaluation of gastrojejunostomy is limited in the absence of oral contrast within the stomach - 7/30 UGI: postop ileus vs obstruction; no leakage noted at Roux-en-Y Surgeries / Procedures:  - 7/13 Roux-en-Y bypass in [place], was d/c same day - 7/17 Expl Lap at [facility] - repair anastomosis  Central access: PICC 7/18 TPN start date: 7/18  Nutritional Goals (RD recommendations on  7/26): KCal: 7530-0511 , Protein: 115-130, Fluid: >/= 2.5 L/day  Goal TPN rate is 100 mL/hr (provides 127g of protein and 2297 kcals per day)  Current Nutrition:  -TPN. Currently infusing Dextrose 10% @ 100 mL/hr due to issue with TPN tubing on 8/1 -Advancing diet to FLD  Plan:  Continue Dextrose 10% infusion @ 100 mL/hr for now  At 1800:  Resume TPN at goal rate of 100 mL/hr  provides 127 gm protein and 2297 Kcal daily, meeting 100% nutritional goals  Electrolytes in TPN: Decrease K & Phos  Na 150 mEq/L  K 50 mEq/L   Ca 5 mEq/L  Mg 5 mEq/L  Phos 8 mmol/L  Cl:Ac ratio 1:1  Standard MVI and trace elements to TPN  Increase SSI to moderate q6h w/ CBG checks  Remove insulin from TPN  Continue NS at 20 ml/hr  Monitor TPN labs on Mon/Thurs, recheck electrolytes tomorrow  Monitor for ability to continue to tolerate advancement of diet and potential taper of TPN  Lenis Noon, PharmD 10/09/19 10:00 AM

## 2019-10-10 LAB — GLUCOSE, CAPILLARY: Glucose-Capillary: 115 mg/dL — ABNORMAL HIGH (ref 70–99)

## 2019-10-10 LAB — BASIC METABOLIC PANEL
Anion gap: 12 (ref 5–15)
BUN: 14 mg/dL (ref 6–20)
CO2: 26 mmol/L (ref 22–32)
Calcium: 8.8 mg/dL — ABNORMAL LOW (ref 8.9–10.3)
Chloride: 99 mmol/L (ref 98–111)
Creatinine, Ser: 1.04 mg/dL (ref 0.61–1.24)
GFR calc Af Amer: >60 mL/min (ref >60)
GFR calc non Af Amer: >60 mL/min (ref >60)
Glucose, Bld: 136 mg/dL — ABNORMAL HIGH (ref 70–99)
Potassium: 4 mmol/L (ref 3.5–5.1)
Sodium: 137 mmol/L (ref 135–145)

## 2019-10-10 LAB — PHOSPHORUS: Phosphorus: 4.5 mg/dL (ref 2.5–4.6)

## 2019-10-10 LAB — MAGNESIUM: Magnesium: 2.3 mg/dL (ref 1.7–2.4)

## 2019-10-10 MED ORDER — OXYCODONE HCL 5 MG PO TABS
5.0000 mg | ORAL_TABLET | ORAL | Status: DC | PRN
  Administered 2019-10-10 – 2019-10-12 (×7): 10 mg via ORAL
  Filled 2019-10-10 (×7): qty 2

## 2019-10-10 MED ORDER — ACETAMINOPHEN 325 MG PO TABS: 650.0000 mg | ORAL_TABLET | Freq: Four times a day (QID) | ORAL | Status: DC | PRN

## 2019-10-10 MED ORDER — TRAVASOL 10 % IV SOLN
INTRAVENOUS | Status: AC
  Filled 2019-10-10: qty 636

## 2019-10-10 MED ORDER — HYDROMORPHONE HCL 1 MG/ML IJ SOLN
1.0000 mg | INTRAMUSCULAR | Status: DC | PRN
  Administered 2019-10-10: 1 mg via INTRAVENOUS
  Filled 2019-10-10 (×2): qty 1

## 2019-10-10 MED ORDER — TRAVASOL 10 % IV SOLN
INTRAVENOUS | Status: DC
  Filled 2019-10-10: qty 1272

## 2019-10-10 NOTE — Progress Notes (Signed)
[facility] Progress Note  17 Days Post-Op  Subjective: CC-  Comfortable this morning. He reports only mild abdominal pain. Tolerating full liquids. Denies bloating or n/v. BM yesterday.  Objective: Vital signs in last 24 hours: Temp:  [98 F (36.7 C)-98.1 F (36.7 C)] 98.1 F (36.7 C) (08/03 0559) Pulse Rate:  [67-88] 67 (08/03 0559) Resp:  [16] 16 (08/03 0559) BP: (119-127)/(74-80) 127/78 (08/03 0559) SpO2:  [96 %-98 %] 97 % (08/03 0559) Last BM Date: 10/09/19  Intake/Output from previous day: 08/02 0701 - 08/03 0700 In: 3438.5 [P.O.:360; I.V.:2880; IV Piggyback:198.5] Out: 1306 [Urine:1300; Drains:5; Stool:1] Intake/Output this shift: No intake/output data recorded.  PE: Gen: Alert, NAD, pleasant Pulm: normal rate and effort Abd: Soft,ND, mild RUQ/epigastric without peritonitis,+BS,right JP drain with no fluid in bulb. Left surgical drain with tan milky fluid. LLQ IR drain with SS fluid Ext: No LE edema Psych: A&Ox3  Skin: no rashes noted, warm and dry   Lab Results:  Recent Labs    10/09/19 0300  WBC 6.5  HGB 10.5*  HCT 32.6*  PLT 311   BMET Recent Labs    10/09/19 0300 10/10/19 0405  NA 135 137  K 4.1 4.0  CL 98 99  CO2 24 26  GLUCOSE 126* 136*  BUN 15 14  CREATININE 1.06 1.04  CALCIUM 8.9 8.8*   PT/INR No results for input(s): LABPROT, INR in the last 72 hours. CMP     Component Value Date/Time   NA 137 10/10/2019 0405   K 4.0 10/10/2019 0405   CL 99 10/10/2019 0405   CO2 26 10/10/2019 0405   GLUCOSE 136 (H) 10/10/2019 0405   BUN 14 10/10/2019 0405   CREATININE 1.04 10/10/2019 0405   CALCIUM 8.8 (L) 10/10/2019 0405   PROT 7.2 10/09/2019 0300   ALBUMIN 2.9 (L) 10/09/2019 0300   AST 26 10/09/2019 0300   ALT 36 10/09/2019 0300   ALKPHOS 208 (H) 10/09/2019 0300   BILITOT 0.7 10/09/2019 0300   GFRNONAA >60 10/10/2019 0405   GFRAA >60 10/10/2019 0405   Lipase     Component Value Date/Time   LIPASE 31 09/22/2019  0623       Studies/Results: No results found.  Anti-infectives: Anti-infectives (From admission, onward)   Start     Dose/Rate Route Frequency Ordered Stop   09/27/19 1000  anidulafungin (ERAXIS) 100 mg in sodium chloride 0.9 % 100 mL IVPB  Status:  Discontinued        100 mg 78 mL/hr over 100 Minutes Intravenous Every 24 hours 09/26/19 0819 10/04/19 1614   09/26/19 1000  anidulafungin (ERAXIS) 200 mg in sodium chloride 0.9 % 200 mL IVPB        200 mg 78 mL/hr over 200 Minutes Intravenous  Once 09/26/19 0819 09/26/19 1334   09/24/19 0730  piperacillin-tazobactam (ZOSYN) IVPB 3.375 g     Discontinue    Note to Pharmacy: Pharmacy to check dosing   3.375 g 12.5 mL/hr over 240 Minutes Intravenous Every 8 hours 09/24/19 0722     09/24/19 0600  cefoTEtan (CEFOTAN) 2 g in sodium chloride 0.9 % 100 mL IVPB        2 g 200 mL/hr over 30 Minutes Intravenous On call to O.R. 09/23/19 1735 09/23/19 1830   09/22/19 0915  piperacillin-tazobactam (ZOSYN) IVPB 3.375 g        3.375 g 100 mL/hr over 30 Minutes Intravenous  Once 09/22/19 0906 09/22/19 1001       Assessment/Plan  Obstructive sleep apnea/CPAP nightly/as needed daytime sleep AKI- resolved Severe calorie malnutrition - Pre albumin <5initially, now 20.1 (8/2) BMI 41  Abdominal abscess with sepsis/peritonitis -S/p Roux-en-Y gastric bypass 09/20/2019, Dr.Christopher Ibikunlewith postoperative leakPOD#20 -Diagnostic laparoscopy, placement of drainage tubes and irrigation/debridement of intra-abdominal abscess/EGD 09/23/2019, Dr. Maisie Fus Cornett/Dr. Caprice Beaver - S/pIR drain placementleft pelvis 09/29/19 >> cx: Enterobacter aerogenes. Continue zosyn - continue drain x3 and monitor output - CT 7/29 shows interval decrease in size of fluid collections s/p drain placement, and evaluation of gastrojejunostomy is limited in the absence of oral contrast within the stomach. - UGI 7/30 negative for leak  FEN: 1/2 TPN, soft  diet ID: Zosyn 7/18 >>day#16 DVT: SCDs,Lovenox Follow up: TBD, ?[facility] in [place], [place]  Plan: Advance to soft diet and start weaning TPN. Continue drains and monitor output. If output remains 0 we may ask IR to study drain(s) prior to discharge. Continue IV antibiotics, I will discuss length of abx therapy with MD.   LOS: 17 days    Franne Forts, [facility] 10/10/2019, 11:25 AM Please see Amion for pager number during day hours 7:00am-4:30pm

## 2019-10-10 NOTE — Progress Notes (Addendum)
PHARMACY - TOTAL PARENTERAL NUTRITION CONSULT NOTE   Indication: post-op bowel rest, intolerance to enteral feeding  Patient Measurements: Height: 5' 10" (177.8 cm) Weight: (!) 127.4 kg (280 lb 12.8 oz) IBW/kg (Calculated) : 73 TPN AdjBW (KG): 86.6 Body mass index is 40.29 kg/m. Usual Weight: 131.5kg, Wt (8/2) = 127.4 kg Adj body weight: ~ 100 kg  Assessment:  44 yoM admit 7/16 with post-op leak at GJ anastomosis (s/p Rous-en-Y gastric bypass on 7/14), Expl Lap 7/17 with placement of drainage tubes and I&D of intra-abdominal abscess. Pharmacy consulted for TPN.  Glucose / Insulin: CBG goal 100-150. Currently WNL; range 110-142 - no hx DM, A1c 5.6 - 4 units SSI in 24 hrs - Insulin removed from TPN on 8/2 Electrolytes: Electrolytes WNL, including CorrCa (9.7) Renal: SCr has slightly increased but stable; BUN WNL & stable; unmeasured UOP x5 Hepatic: AST/ALT WNL; Alk phos elevated but trending down (311 > 208); Tbili WNL TGs: Remain elevated but stable (275 on 8/2).  Elevated on D2 TPN, no baseline obtained so unknown if this was entirely d/t TPN; peaked at 443 on 7/20 - Lipids removed 7/19, added back at 10 g/L on 7/23; increased to 15 g/L on 7/26 Prealbumin: Improved to normal (20.1) Intake / Output: UOP incompletely charted, drain OP 5mL MIVF: NS at 20 mL/hr GI Imaging:  - 7/16 Chest/Abd CT: no PE, possible aspiration/lungs, pneumoperitoneum/gas, ascites, severe hepatic steatosis, mild diverticulosis - no inflammation - 7/29 CT: interval decrease in size of fluid collections s/p drain placement, and evaluation of gastrojejunostomy is limited in the absence of oral contrast within the stomach - 7/30 UGI: postop ileus vs obstruction; no leakage noted at Roux-en-Y Surgeries / Procedures:  - 7/13: Roux-en-Y bypass in [place], was d/c same day - 7/17: Expl Lap at [facility] - repair anastomosis, I&D of intra-abdominal abscess, placement of drainage tubes - 7/23: IR placement of left pelvic  drain  Central access: PICC 7/18 TPN start date: 7/18  Nutritional Goals (RD recommendations on 8/2): KCal: 2300-2500, Protein: 115-130, Fluid: >/= 2.5 L/day  Goal TPN rate is 100 mL/hr (provides 127g of protein and 2297 kcals per day); meeting >90% of patient needs  Current Nutrition:  -TPN -Diet advanced to full liquid -Ensure max BID  Plan:   At 1800:  Continue TPN at goal rate of 100 mL/hr  provides 127 gm protein and 2297 Kcal daily, meeting 100% nutritional goals  Electrolytes in TPN: Slightly decrease Phos  Na 150 mEq/L  K 50 mEq/L   Ca 5 mEq/L  Mg 5 mEq/L  Phos 7 mmol/L  Cl:Ac ratio 1:1  Standard MVI and trace elements in TPN  Continue CBG checks + moderate SSI q6h  Continue NS at 20 ml/hr  Monitor TPN labs on Mon/Thurs, recheck electrolytes tomorrow.   Monitor for ability to continue to tolerate advancement of diet and potential taper of TPN. Per [facility] note, continue TPN until PO intake improves  Mary M Swayne, PharmD 10/10/19 9:14 AM  Addendum:  Orders received from [facility] to reduce TPN to 1/2 rate. Will order TPN at above concentration but reduce rate to 50 mL/hr.  Mary M Swayne, PharmD 10/10/19 11:33 AM  

## 2019-10-10 NOTE — Progress Notes (Signed)
Referring Physician(s): Dr. Raelyn Mora  Supervising Physician: Irish Lack  Patient Status:  [facility] - In-pt  Chief Complaint:  Abdominal pain and distention. Fund to have multiple intra abdominal abscess collection s/p IR LLQ/ pelvic drain on 7.23.21  Subjective: 44 y.o. male, inpatient. 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 Presented to [facility] ED with abdominal pain and tachycardia found to have multiple intra abdominal fluid collections. S/p lap chole on  7.17.21 with 2 blake drains. IR placed a third drain to the LLQ/ pelvis fluid collection on 7.23.21. Patient alert and sitting in a chair calm and comfortable. Wife in room Denies any fevers, headache, chest pain, SOB, cough, nausea, vomiting or bleeding. Patient reports a marked improvement of his symptoms since admission.   Allergies: Patient has no known allergies.  Medications: Prior to Admission medications   Medication Sig Start Date End Date Taking? Authorizing Provider  hyoscyamine (LEVSIN SL) 0.125 MG SL tablet Place 0.125 mg under the tongue every 6 (six) hours. 09/19/19  Yes [provider]  ibuprofen (ADVIL) 100 MG/5ML suspension Take 30 mLs by mouth every 6 (six) hours as needed for pain. 09/19/19  Yes [provider]  ondansetron (ZOFRAN-ODT) 4 MG disintegrating tablet Take 4 mg by mouth every 6 (six) hours as needed for nausea/vomiting. 09/19/19  Yes [provider]  pantoprazole (PROTONIX) 40 MG tablet Take 40 mg by mouth daily. 09/19/19  Yes [provider]  cephALEXin (KEFLEX) 500 MG capsule Take 1 capsule (500 mg total) by mouth 3 (three) times daily. Patient not taking: Reported on 09/22/2019 02/23/14   Vivi Barrack, DPM  HYDROcodone-acetaminophen (NORCO/VICODIN) 5-325 MG tablet Take 1-2 tablets by mouth every 4 (four) hours as needed. Patient not taking: Reported on 09/22/2019 12/26/15   Romie Levee, MD  oxyCODONE-acetaminophen (PERCOCET/ROXICET) 5-325 MG tablet  Take 1 tablet by mouth every 8 (eight) hours as needed for pain. 09/19/19   [provider]  PROMETHEGAN 25 MG suppository Place 25 mg rectally every 12 (twelve) hours as needed for nausea/vomiting. 09/19/19   [provider]     Vital Signs: BP 116/69 (BP Location: Right Arm)   Pulse 83   Temp 98.3 F (36.8 C)   Resp 19   Ht 5\' 10"  (1.778 m)   Wt (!) 280 lb 12.8 oz (127.4 kg)   SpO2 98%   BMI 40.29 kg/m   Physical Exam Vitals and nursing note reviewed.  Constitutional:      Appearance: He is well-developed.  HENT:     Head: Normocephalic.  Pulmonary:     Effort: Pulmonary effort is normal.  Abdominal:     Comments: Positive LLQ drain  to gravity bag. Site is unremarkable with no erythema, edema, tenderness, bleeding or drainage noted at exit site. Suture and stat lock in place. Dressing is clean dry and intact. 20 ml of  serosanguinous colored fluid noted in gravity bad. Drain is able to be flushed easily.    Musculoskeletal:        General: Normal range of motion.     Cervical back: Normal range of motion.  Skin:    General: Skin is dry.  Neurological:     Mental Status: He is alert and oriented to person, place, and time.     Imaging: No results found.  Labs:  CBC: Recent Labs    10/02/19 0353 10/05/19 0337 10/06/19 0400 10/09/19 0300  WBC 14.3* 8.7 7.8 6.5  HGB 11.2* 10.5* 10.7* 10.5*  HCT 35.3* 32.5* 33.7* 32.6*  PLT 470* 436* 451* 311    COAGS: Recent Labs    09/22/19 0623 09/29/19 1356  INR 1.1 1.0    BMP: Recent Labs    10/07/19 0509 10/08/19 0415 10/09/19 0300 10/10/19 0405  NA 136 134* 135 137  K 4.1 4.2 4.1 4.0  CL 99 100 98 99  CO2 24 26 24 26   GLUCOSE 148* 130* 126* 136*  BUN 15 16 15 14   CALCIUM 8.9 8.6* 8.9 8.8*  CREATININE 0.87 0.88 1.06 1.04  GFRNONAA >60 >60 >60 >60  GFRAA >60 >60 >60 >60    LIVER FUNCTION TESTS: Recent Labs    10/01/19 0500 10/02/19 0353 10/05/19 0337 10/09/19 0300  BILITOT  1.1 0.6 0.5 0.7  AST 21 18 23 26   ALT 41 35 36 36  ALKPHOS 266* 280* 311* 208*  PROT 7.1 6.9 7.0 7.2  ALBUMIN 2.5* 2.5* 2.6* 2.9*    Assessment and Plan:  44 y.o, male inpatient. 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 Presented to [facility] ED with abdominal pain and tachycardia found to have multiple intra abdominal fluid collections. S/p lap chole on  7.17.21 with 2 blake drains. IR placed a third drain to the LLQ/ pelvis fluid collection on 7.23.21. Per Epic output is:  .20 ml, 10 ml, 20 ml  CT abd pelvis from 7.29.20 reads Interval placement of a pigtail drainage catheter in the loculated collection within the pelvis with significant interval decrease in the size of this collection compared to the prior CT. Interval decrease in the size of additional loculated collections along the undersurface of the liver.  No elevated WBC, Hgb stable,  All other labs are within acceptable parameters. Patient is afebrile. GI study from 7.30.21 was negative for leak.   Recommend team continue with flushing TID, output recording q shift and dressing changes as needed. Would consider additional imaging when output is less than 10 ml for 24 hours not including flush material.   Continue current treatment plans as per Surgery   Electronically Signed: 11-22-1967, NP 10/10/2019, 4:34 PM   I spent a total of 15 Minutes at the patient's bedside AND on the patient's hospital floor or unit, greater than 50% of which was counseling/coordinating care for LLQ/ pelvic abscess drain.

## 2019-10-11 LAB — BASIC METABOLIC PANEL
Anion gap: 12 (ref 5–15)
BUN: 15 mg/dL (ref 6–20)
CO2: 23 mmol/L (ref 22–32)
Calcium: 8.5 mg/dL — ABNORMAL LOW (ref 8.9–10.3)
Chloride: 102 mmol/L (ref 98–111)
Creatinine, Ser: 1.06 mg/dL (ref 0.61–1.24)
GFR calc Af Amer: >60 mL/min (ref >60)
GFR calc non Af Amer: >60 mL/min (ref >60)
Glucose, Bld: 115 mg/dL — ABNORMAL HIGH (ref 70–99)
Potassium: 4.1 mmol/L (ref 3.5–5.1)
Sodium: 137 mmol/L (ref 135–145)

## 2019-10-11 LAB — GLUCOSE, CAPILLARY
Glucose-Capillary: 102 mg/dL — ABNORMAL HIGH (ref 70–99)
Glucose-Capillary: 111 mg/dL — ABNORMAL HIGH (ref 70–99)
Glucose-Capillary: 119 mg/dL — ABNORMAL HIGH (ref 70–99)
Glucose-Capillary: 128 mg/dL — ABNORMAL HIGH (ref 70–99)

## 2019-10-11 LAB — PHOSPHORUS: Phosphorus: 4.5 mg/dL (ref 2.5–4.6)

## 2019-10-11 LAB — MAGNESIUM: Magnesium: 2.2 mg/dL (ref 1.7–2.4)

## 2019-10-11 MED ORDER — ADULT MULTIVITAMIN LIQUID CH
15.0000 mL | Freq: Every day | ORAL | Status: DC
  Administered 2019-10-11 – 2019-10-12 (×2): 15 mL via ORAL
  Filled 2019-10-11 (×2): qty 15

## 2019-10-11 NOTE — Progress Notes (Signed)
[facility] Progress Note  18 Days Post-Op  Subjective: CC-  Feeling well this morning. Abdomen still sore at times but improving. Denies n/v. Tolerating soft diet. BM yesterday.  Objective: Vital signs in last 24 hours: Temp:  [98.2 F (36.8 C)-98.6 F (37 C)] 98.2 F (36.8 C) (08/04 0612) Pulse Rate:  [79-84] 79 (08/04 0612) Resp:  [18-19] 19 (08/04 0612) BP: (113-120)/(69-76) 113/75 (08/04 0612) SpO2:  [96 %-98 %] 97 % (08/04 0612) Last BM Date: 10/09/19  Intake/Output from previous day: 08/03 0701 - 08/04 0700 In: 2466.4 [P.O.:350; I.V.:1950.2; IV Piggyback:156.3] Out: 540 [Urine:500; Drains:40] Intake/Output this shift: No intake/output data recorded.  PE: Gen: Alert, NAD, pleasant Pulm: normal rate and effort Abd: Soft,ND, mild RUQ/epigastric without peritonitis,+BS,right JP drain with trace thin dark fluid.Left surgical drain with less milky fluid and more serous/cloudy. LLQ IR drain with SS fluid Ext: No LE edema Psych: A&Ox3  Skin: no rashes noted, warm and dry    Lab Results:  Recent Labs    10/09/19 0300  WBC 6.5  HGB 10.5*  HCT 32.6*  PLT 311   BMET Recent Labs    10/10/19 0405 10/10/19 0430  NA 137 137  K 4.0 4.1  CL 99 102  CO2 26 23  GLUCOSE 136* 115*  BUN 14 15  CREATININE 1.04 1.06  CALCIUM 8.8* 8.5*   PT/INR No results for input(s): LABPROT, INR in the last 72 hours. CMP     Component Value Date/Time   NA 137 10/10/2019 0430   K 4.1 10/10/2019 0430   CL 102 10/10/2019 0430   CO2 23 10/10/2019 0430   GLUCOSE 115 (H) 10/10/2019 0430   BUN 15 10/10/2019 0430   CREATININE 1.06 10/10/2019 0430   CALCIUM 8.5 (L) 10/10/2019 0430   PROT 7.2 10/09/2019 0300   ALBUMIN 2.9 (L) 10/09/2019 0300   AST 26 10/09/2019 0300   ALT 36 10/09/2019 0300   ALKPHOS 208 (H) 10/09/2019 0300   BILITOT 0.7 10/09/2019 0300   GFRNONAA >60 10/10/2019 0430   GFRAA >60 10/10/2019 0430   Lipase     Component Value Date/Time    LIPASE 31 09/22/2019 0623       Studies/Results: No results found.  Anti-infectives: Anti-infectives (From admission, onward)   Start     Dose/Rate Route Frequency Ordered Stop   09/27/19 1000  anidulafungin (ERAXIS) 100 mg in sodium chloride 0.9 % 100 mL IVPB  Status:  Discontinued        100 mg 78 mL/hr over 100 Minutes Intravenous Every 24 hours 09/26/19 0819 10/04/19 1614   09/26/19 1000  anidulafungin (ERAXIS) 200 mg in sodium chloride 0.9 % 200 mL IVPB        200 mg 78 mL/hr over 200 Minutes Intravenous  Once 09/26/19 0819 09/26/19 1334   09/24/19 0730  piperacillin-tazobactam (ZOSYN) IVPB 3.375 g       Note to Pharmacy: Pharmacy to check dosing   3.375 g 12.5 mL/hr over 240 Minutes Intravenous Every 8 hours 09/24/19 0722 10/10/19 2359   09/24/19 0600  cefoTEtan (CEFOTAN) 2 g in sodium chloride 0.9 % 100 mL IVPB        2 g 200 mL/hr over 30 Minutes Intravenous On call to O.R. 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 Maisie Fus Cornett/Dr. Wynelle Cleveland - S/pIR drain placementleft pelvis 09/29/19 >> cx: Enterobacter aerogenes. Completed 16 days zosyn - continue drain x3 and monitor output - CT 7/29 shows interval decrease in size of fluid collections s/p drain placement, and evaluation ofgastrojejunostomy is limited in the absence of oral contrast within the stomach. - UGI 7/30 negative for  leak  FEN: 1/2 TPN, soft diet (proteins only) ID: Zosyn 7/18 >>8/3 DVT: SCDs,Lovenox Follow up: IR, Dr. Daphine Deutscher?  Plan: D/c TPN after today. Continue soft diet. Will ask bariatric coordinator to see patient for postop education. Continue drains - needs drain teaching so he can care for these at home. Possible discharge home tomorrow.   LOS: 18 days    Franne Forts, [facility] Surgery 10/11/2019, 8:08 AM Please see Amion for pager number during day hours 7:00am-4:30pm

## 2019-10-11 NOTE — Progress Notes (Signed)
PHARMACY - TOTAL PARENTERAL NUTRITION CONSULT NOTE   Indication: post-op bowel rest, intolerance to enteral feeding  Patient Measurements: Height: 5' 10" (177.8 cm) Weight: (!) 127.4 kg (280 lb 12.8 oz) IBW/kg (Calculated) : 73 TPN AdjBW (KG): 86.6 Body mass index is 40.29 kg/m. Usual Weight: 131.5kg, Wt (8/2) = 127.4 kg Adj body weight: ~ 100 kg  Assessment:  18 yoM admit 7/16 with post-op leak at [place] anastomosis (s/p Rous-en-Y gastric bypass on 7/14), Expl Lap 7/17 with placement of drainage tubes and I&D of intra-abdominal abscess. Pharmacy consulted for TPN.  Glucose / Insulin: CBG goal 100-150. Currently WNL; range 111-115 - no hx DM, A1c 5.6 - 2 units SSI in 24 hrs - Insulin removed from TPN on 8/2 Electrolytes: Electrolytes WNL, including CorrCa (9.7) on 8/4 Renal: SCr has slightly increased but stable; BUN WNL & stable; unmeasured UOP Hepatic: AST/ALT WNL; Alk phos elevated but trending down (311 > 208); Tbili WNL TGs: Remain elevated but stable (275 on 8/2).  Elevated on D2 TPN, no baseline obtained so unknown if this was entirely d/t TPN; peaked at 443 on 7/20 - Lipids removed 7/19, added back at 10 g/L on 7/23; increased to 15 g/L on 7/26 Prealbumin: Improved to normal (20.1) Intake / Output: UOP incompletely charted, drain OP 78m MIVF: NS at 20 mL/hr GI Imaging:  - 7/16 Chest/Abd CT: no PE, possible aspiration/lungs, pneumoperitoneum/gas, ascites, severe hepatic steatosis, mild diverticulosis - no inflammation - 7/29 CT: interval decrease in size of fluid collections s/p drain placement, and evaluation of gastrojejunostomy is limited in the absence of oral contrast within the stomach - 7/30 UGI: postop ileus vs obstruction; no leakage noted at Roux-en-Y Surgeries / Procedures:  - 7/13: Roux-en-Y bypass in [place], was d/c same day - [facility], I&D of intra-abdominal abscess, placement of drainage tubes - 7/23: IR placement of left pelvic  drain  Central access: PICC 7/18 TPN start date: 7/18  Nutritional Goals (RD recommendations on 8/2): KCal: 2300-2500, Protein: 115-130, Fluid: >/= 2.5 L/day  Goal TPN rate is 100 mL/hr (provides 127g of protein and 2297 kcals per day); meeting >90% of patient needs  Current Nutrition:  -TPN at 50 mL/hr (1/2 of goal rate) -Diet advanced to soft -Ensure max BID  Plan:  Received orders from [facility] to wean off of TPN today  TPN already infusing at reduced rate. Will infuse until 1800 tonight when bag is complete.   Will discontinue all associated labs, nursing orders, and SSI/CBG checks.   Pharmacy to sign off.   MLenis Noon PharmD 10/11/19 8:42 AM

## 2019-10-11 NOTE — Progress Notes (Signed)
Referring Physician(s): Newman,D  Supervising Physician: Simonne Come  Patient Status:  [facility] - In-pt  Chief Complaint: Abdominal pain/fluid collections   Subjective: Patient doing fairly well today ; plans noted for possible discharge home tomorrow; has some minimal abdominal discomfort, no nausea or vomiting   Allergies: Patient has no known allergies.  Medications: Prior to Admission medications   Medication Sig Start Date End Date Taking? Authorizing Provider  hyoscyamine (LEVSIN SL) 0.125 MG SL tablet Place 0.125 mg under the tongue every 6 (six) hours. 09/19/19  Yes [provider]  ibuprofen (ADVIL) 100 MG/5ML suspension Take 30 mLs by mouth every 6 (six) hours as needed for pain. 09/19/19  Yes [provider]  ondansetron (ZOFRAN-ODT) 4 MG disintegrating tablet Take 4 mg by mouth every 6 (six) hours as needed for nausea/vomiting. 09/19/19  Yes [provider]  pantoprazole (PROTONIX) 40 MG tablet Take 40 mg by mouth daily. 09/19/19  Yes [provider]  cephALEXin (KEFLEX) 500 MG capsule Take 1 capsule (500 mg total) by mouth 3 (three) times daily. Patient not taking: Reported on 09/22/2019 02/23/14   Vivi Barrack, DPM  HYDROcodone-acetaminophen (NORCO/VICODIN) 5-325 MG tablet Take 1-2 tablets by mouth every 4 (four) hours as needed. Patient not taking: Reported on 09/22/2019 12/26/15   Romie Levee, MD  oxyCODONE-acetaminophen (PERCOCET/ROXICET) 5-325 MG tablet Take 1 tablet by mouth every 8 (eight) hours as needed for pain. 09/19/19   [provider]  PROMETHEGAN 25 MG suppository Place 25 mg rectally every 12 (twelve) hours as needed for nausea/vomiting. 09/19/19   [provider]     Vital Signs: BP 132/82 (BP Location: Left Arm)   Pulse 95   Temp 98.4 F (36.9 C) (Oral)   Resp 17   Ht 5\' 10"  (1.778 m)   Wt (!) 280 lb 12.8 oz (127.4 kg)   SpO2 99%   BMI 40.29 kg/m   Physical Exam awake, alert.  Left  lower quadrant drain intact, insertion site okay, minimal tenderness to palpation, output about 10 cc serosanguineous fluid  Imaging: No results found.  Labs:  CBC: Recent Labs    10/02/19 0353 10/05/19 0337 10/06/19 0400 10/09/19 0300  WBC 14.3* 8.7 7.8 6.5  HGB 11.2* 10.5* 10.7* 10.5*  HCT 35.3* 32.5* 33.7* 32.6*  PLT 470* 436* 451* 311    COAGS: Recent Labs    09/22/19 0623 09/29/19 1356  INR 1.1 1.0    BMP: Recent Labs    10/08/19 0415 10/09/19 0300 10/10/19 0405 10/10/19 0430  NA 134* 135 137 137  K 4.2 4.1 4.0 4.1  CL 100 98 99 102  CO2 26 24 26 23   GLUCOSE 130* 126* 136* 115*  BUN 16 15 14 15   CALCIUM 8.6* 8.9 8.8* 8.5*  CREATININE 0.88 1.06 1.04 1.06  GFRNONAA >60 >60 >60 >60  GFRAA >60 >60 >60 >60    LIVER FUNCTION TESTS: Recent Labs    10/01/19 0500 10/02/19 0353 10/05/19 0337 10/09/19 0300  BILITOT 1.1 0.6 0.5 0.7  AST 21 18 23 26   ALT 41 35 36 36  ALKPHOS 266* 280* 311* 208*  PROT 7.1 6.9 7.0 7.2  ALBUMIN 2.5* 2.5* 2.6* 2.9*    Assessment and Plan: Pts/p outpatient lap Roux-en-Y bypass surgery in [place] 09/19/19; s/p diag lap with placement of 2 surgical drains 7/17 for post op leak at GJ site; s/p drainage of LLQ/pelvic fluid collection 7/23;drain fl cx- rare enterobacter; afebrile, no new labs; patient tentatively scheduled for  discharge home tomorrow; have instructed patient to flush left lower quadrant drain once daily with 5 cc sterile normal saline, record output and change dressing every 2 to 3 days.  Patient given prescription for saline flushes as well as output card with IR clinic phone number; will schedule patient for follow-up in IR clinic   Electronically Signed: D. Jeananne Rama, PA-C 10/11/2019, 1:43 PM   I spent a total of 15 minutes at the the patient's bedside AND on the patient's hospital floor or unit, greater than 50% of which was counseling/coordinating care for left abdominal abscess drain    Patient ID:  Brad Hughes, male   DOB: [birth year], 44 y.o.   MRN: [id number]

## 2019-10-11 NOTE — Progress Notes (Addendum)
Provided contact information, reviewed Phase 3 diet with patient and spouse.  We discussed the need for lifelong vitamin/calcium supplementation and how those supplements should be taken.  Provided patient and spouse list for this supplementation and resources for obtaining. Appointment made with outpatient RD for diet education and advancement 11/16/19 at 9 am.  Discussed patient needs with RD Derry Skill.  Corene Cornea Bariatric Nurse Coordinator Mobile Number [phone]  Documents provided:  Phase 3 Bariatric Diet (proteins only) Vitamin Schedule Vitamin Supplementation Recommendations.

## 2019-10-12 ENCOUNTER — Other Ambulatory Visit: Payer: Self-pay | Admitting: Surgery

## 2019-10-12 DIAGNOSIS — R188 Other ascites: Secondary | ICD-10-CM

## 2019-10-12 LAB — GLUCOSE, CAPILLARY
Glucose-Capillary: 105 mg/dL — ABNORMAL HIGH (ref 70–99)
Glucose-Capillary: 90 mg/dL (ref 70–99)

## 2019-10-12 MED ORDER — ACETAMINOPHEN 325 MG PO TABS: 650.0000 mg | ORAL_TABLET | Freq: Four times a day (QID) | ORAL | Status: AC | PRN

## 2019-10-12 MED ORDER — ENSURE MAX PROTEIN PO LIQD: 11.0000 [oz_av] | Freq: Two times a day (BID) | ORAL | Status: DC

## 2019-10-12 MED ORDER — PANTOPRAZOLE SODIUM 40 MG PO TBEC: 40.0000 mg | DELAYED_RELEASE_TABLET | Freq: Two times a day (BID) | ORAL | 0 refills | Status: DC

## 2019-10-12 MED ORDER — OXYCODONE HCL 5 MG PO TABS: 5.0000 mg | ORAL_TABLET | Freq: Four times a day (QID) | ORAL | 0 refills | Status: DC | PRN

## 2019-10-12 MED ORDER — LIDOCAINE 5 % EX PTCH: 1.0000 | MEDICATED_PATCH | CUTANEOUS | 0 refills | Status: DC

## 2019-10-12 MED FILL — oxyCODONE HCL 5 MG TABS: 5 | 7 days supply | Qty: 28 | Fill #0

## 2019-10-12 MED FILL — PANTOPRAZOLE SOD DR 40 MG T: 40 | 30 days supply | Qty: 60 | Fill #0

## 2019-10-12 MED FILL — LIDOCAINE PATCH 5%: 5 | 15 days supply | Qty: 15 | Fill #0

## 2019-10-12 NOTE — Progress Notes (Signed)
D/C instructions given to patient. Patient had no questions. NT or writer will wheel patient out once he is dressed  

## 2019-10-16 LAB — GLUCOSE, CAPILLARY
Glucose-Capillary: 117 mg/dL — ABNORMAL HIGH (ref 70–99)
Glucose-Capillary: 119 mg/dL — ABNORMAL HIGH (ref 70–99)
Glucose-Capillary: 126 mg/dL — ABNORMAL HIGH (ref 70–99)
Glucose-Capillary: 127 mg/dL — ABNORMAL HIGH (ref 70–99)
Glucose-Capillary: 129 mg/dL — ABNORMAL HIGH (ref 70–99)
Glucose-Capillary: 131 mg/dL — ABNORMAL HIGH (ref 70–99)
Glucose-Capillary: 131 mg/dL — ABNORMAL HIGH (ref 70–99)
Glucose-Capillary: 133 mg/dL — ABNORMAL HIGH (ref 70–99)
Glucose-Capillary: 141 mg/dL — ABNORMAL HIGH (ref 70–99)
Glucose-Capillary: 142 mg/dL — ABNORMAL HIGH (ref 70–99)
Glucose-Capillary: 144 mg/dL — ABNORMAL HIGH (ref 70–99)
Glucose-Capillary: 146 mg/dL — ABNORMAL HIGH (ref 70–99)
Glucose-Capillary: 147 mg/dL — ABNORMAL HIGH (ref 70–99)
Glucose-Capillary: 149 mg/dL — ABNORMAL HIGH (ref 70–99)
Glucose-Capillary: 153 mg/dL — ABNORMAL HIGH (ref 70–99)
Glucose-Capillary: 155 mg/dL — ABNORMAL HIGH (ref 70–99)
Glucose-Capillary: 156 mg/dL — ABNORMAL HIGH (ref 70–99)
Glucose-Capillary: 166 mg/dL — ABNORMAL HIGH (ref 70–99)
Glucose-Capillary: 91 mg/dL (ref 70–99)
Glucose-Capillary: 155 mg/dL — ABNORMAL HIGH (ref 70–99)

## 2019-10-24 ENCOUNTER — Ambulatory Visit
Admission: RE | Admit: 2019-10-24 | Discharge: 2019-10-24 | Disposition: A | Discharge status: Home / Self Care | Payer: Self-pay | Source: Ambulatory Visit | Attending: Radiology | Admitting: Radiology

## 2019-10-24 ENCOUNTER — Encounter: Payer: Self-pay | Admitting: Radiology

## 2019-10-24 ENCOUNTER — Ambulatory Visit
Admission: RE | Admit: 2019-10-24 | Discharge: 2019-10-24 | Disposition: A | Discharge status: Home / Self Care | Payer: Self-pay | Source: Ambulatory Visit | Attending: Surgery | Admitting: Surgery

## 2019-10-24 DIAGNOSIS — R188 Other ascites: Secondary | ICD-10-CM

## 2019-10-24 HISTORY — PX: IR RADIOLOGIST EVAL & MGMT: IMG5224

## 2019-10-24 MED ORDER — IOPAMIDOL (ISOVUE-300) INJECTION 61%
100.0000 mL | Freq: Once | INTRAVENOUS | Status: AC | PRN
  Administered 2019-10-24: 100 mL via INTRAVENOUS

## 2019-10-24 NOTE — Progress Notes (Signed)
Chief Complaint: Patient was seen in consultation today for abscess drain check  Referring Physician(s): Dr. Wenda Low  History of Present Illness: Brad Hughes is a 44 y.o. male with hx of Roux-En-Y bypass and developed post op abscesses. Required IR perc drain to LLQ on 7/23. Pt here today for follow up CT and drain eval. Feels well, doing well. Reports minimal output from LLQ drain. Also still has two upper abd surgical drains.  Past Medical History:  Diagnosis Date  . Anal fistula 2017  . Hyperlipidemia   . 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   POD#2 s/p roux-en-y gastric bypass     Past Surgical History:  Procedure Laterality Date  . ANAL FISTULOTOMY N/A 12/26/2015   Procedure: FISTULOTOMY;  Surgeon: Romie Levee, MD;  Location: [facility];  Service: General;  Laterality: N/A;  . IR RADIOLOGIST EVAL & MGMT  10/24/2019  . LAPAROSCOPY N/A 09/23/2019   Procedure: LAPAROSCOPY DIAGNOSTIC, EGD, Drainage of intra-abdominal abscess;  Surgeon: Harriette Bouillon, MD;  Location: [facility];  Service: General;  Laterality: N/A;  . RECTAL EXAM UNDER ANESTHESIA  2015  . RECTAL EXAM UNDER ANESTHESIA N/A 12/26/2015   Procedure: RECTAL EXAM UNDER ANESTHESIA;  Surgeon: Romie Levee, MD;  Location: [facility];  Service: General;  Laterality: N/A;  . ROUX-EN-Y GASTRIC BYPASS  09/20/2019   in [place]   . TREATMENT FISTULA ANAL      Allergies: Patient has no known allergies.  Medications: Prior to Admission medications   Medication Sig Start Date End Date Taking? Authorizing Provider  acetaminophen (TYLENOL) 325 MG tablet Take 2 tablets (650 mg total) by mouth every 6 (six) hours as needed for mild pain. 10/12/19   Meuth, Lina Sar, PA-C  Ensure Max Protein (ENSURE MAX PROTEIN) LIQD Take 330 mLs (11 oz total) by mouth 2 (two) times daily. 10/12/19   Meuth,  Lina Sar, PA-C  hyoscyamine (LEVSIN SL) 0.125 MG SL tablet Place 0.125 mg under the tongue every 6 (six) hours. 09/19/19   [provider]  lidocaine (LIDODERM) 5 % Place 1 patch onto the skin daily. Remove & Discard patch within 12 hours or as directed by MD 10/12/19   Carlena Bjornstad A, PA-C  oxyCODONE (OXY IR/ROXICODONE) 5 MG immediate release tablet Take 1 tablet (5 mg total) by mouth every 6 (six) hours as needed for severe pain. 10/12/19   Meuth, Brooke A, PA-C  pantoprazole (PROTONIX) 40 MG tablet Take 1 tablet (40 mg total) by mouth 2 (two) times daily. 10/12/19   Meuth, Brooke A, PA-C     No family history on file.  Social History   Socioeconomic History  . Marital status: Married    Spouse name: Not on file  . Number of children: Not on file  . Years of education: Not on file  . Highest education level: Not on file  Occupational History  . Not on file  Tobacco Use  . Smoking status: Former Smoker    Types: Cigarettes    Quit date: 12/22/2008    Years since quitting: 10.8  . Smokeless tobacco: Never Used  Substance and Sexual Activity  . Alcohol use: No  . Drug use: Not on file  . Sexual activity: Not on file  Other Topics Concern  . Not on file  Social History Narrative   ** Merged History Encounter **  Social Determinants of Health   Financial Resource Strain:   . Difficulty of Paying Living Expenses:   Food Insecurity:   . Worried About Programme researcher, broadcasting/film/video in the Last Year:   . Barista in the Last Year:   Transportation Needs:   . Freight forwarder (Medical):   Marland Kitchen Lack of Transportation (Non-Medical):   Physical Activity:   . Days of Exercise per Week:   . Minutes of Exercise per Session:   Stress:   . Feeling of Stress :   Social Connections:   . Frequency of Communication with Friends and Family:   . Frequency of Social Gatherings with Friends and Family:   . Attends Religious Services:   . Active Member of Clubs or Organizations:    . Attends Banker Meetings:   Marland Kitchen Marital Status:     Review of Systems: A 12 point ROS discussed and pertinent positives are indicated in the HPI above.  All other systems are negative.  Review of Systems  Vital Signs: BP 126/78   Pulse 87   Temp 98.3 F (36.8 C)   SpO2 98%   Physical Exam Cardiovascular:     Rate and Rhythm: Normal rate and regular rhythm.     Heart sounds: Normal heart sounds.  Pulmonary:     Effort: Pulmonary effort is normal. No respiratory distress.     Breath sounds: Normal breath sounds.  Abdominal:     General: Abdomen is flat.     Palpations: Abdomen is soft.     Comments: RUQ and LUQ surgical drains intact  LLQ IR perc drain intact, scant output, site clean.      Imaging: IR Radiologist Eval & Mgmt  Result Date: 10/24/2019 Please refer to notes tab for details about interventional procedure. (Op Note)   Labs:  CBC: Recent Labs    10/02/19 0353 10/05/19 0337 10/06/19 0400 10/09/19 0300  WBC 14.3* 8.7 7.8 6.5  HGB 11.2* 10.5* 10.7* 10.5*  HCT 35.3* 32.5* 33.7* 32.6*  PLT 470* 436* 451* 311    COAGS: Recent Labs    09/22/19 0623 09/29/19 1356  INR 1.1 1.0    BMP: Recent Labs    10/08/19 0415 10/09/19 0300 10/10/19 0405 10/10/19 0430  NA 134* 135 137 137  K 4.2 4.1 4.0 4.1  CL 100 98 99 102  CO2 26 24 26 23   GLUCOSE 130* 126* 136* 115*  BUN 16 15 14 15   CALCIUM 8.6* 8.9 8.8* 8.5*  CREATININE 0.88 1.06 1.04 1.06  GFRNONAA >60 >60 >60 >60  GFRAA >60 >60 >60 >60    LIVER FUNCTION TESTS: Recent Labs    10/01/19 0500 10/02/19 0353 10/05/19 0337 10/09/19 0300  BILITOT 1.1 0.6 0.5 0.7  AST 21 18 23 26   ALT 41 35 36 36  ALKPHOS 266* 280* 311* 208*  PROT 7.1 6.9 7.0 7.2  ALBUMIN 2.5* 2.5* 2.6* 2.9*    TUMOR MARKERS: No results for input(s): AFPTM, CEA, CA199, CHROMGRNA in the last 8760 hours.  Assessment and Plan: LLQ post surgical abscess. S/p perc drain 7/23 CT reviewed by Dr.  12/09/19 LLQ abscess resolved. Drain removed without difficulty. Pt to follow up with his surgeon regarding remaining drains and full CT report. No further IR follow up necessary.  Thank you for this interesting consult.  I greatly enjoyed meeting Brad Hughes and look forward to participating in their care.  A copy of this report was sent to the  requesting provider on this date.  Electronically Signed: Brayton El 10/24/2019, 1:22 PM   I spent a total of 20 minutes in face to face in clinical consultation, greater than 50% of which was counseling/coordinating care for LLQ abscess drain

## 2019-11-06 ENCOUNTER — Other Ambulatory Visit: Payer: Self-pay | Admitting: Surgery

## 2019-11-06 ENCOUNTER — Other Ambulatory Visit (HOSPITAL_COMMUNITY): Payer: Self-pay | Admitting: Surgery

## 2019-11-06 DIAGNOSIS — Z9884 Bariatric surgery status: Secondary | ICD-10-CM

## 2019-11-15 ENCOUNTER — Other Ambulatory Visit: Payer: Self-pay | Admitting: Surgery

## 2019-11-15 DIAGNOSIS — Z9884 Bariatric surgery status: Secondary | ICD-10-CM

## 2019-11-16 ENCOUNTER — Encounter: Payer: Self-pay | Attending: Surgery | Admitting: Dietician

## 2019-11-16 ENCOUNTER — Encounter: Payer: Self-pay | Admitting: Dietician

## 2019-11-16 ENCOUNTER — Other Ambulatory Visit: Payer: Self-pay

## 2019-11-16 DIAGNOSIS — E669 Obesity, unspecified: Secondary | ICD-10-CM | POA: Insufficient documentation

## 2019-11-16 NOTE — Progress Notes (Signed)
Bariatric Nutrition Follow-Up Visit Medical Nutrition Therapy  Appt Start Time: 9:15am    End Time: 9:45am  2 Months Post-Operative RYGB Surgery Surgery Date: 09/20/2019   NUTRITION ASSESSMENT  Anthropometrics  Start weight at [facility]: 246 lbs (date: 11/16/2019) Today's weight: 246 lbs BMI: 35.3 kg/m   Lifestyle & Dietary Hx Patient has RYGB surgery almost 2 months ago and experienced post-operative complications requiring hospitalization. Patient was advanced to solid protein foods at the appropriate time. Patient states he can tolerate beans, chicken, and beef well, and has tried some small amounts of fruit and vegetables. Typical meal pattern includes a protein shake plus 2 protein meals per day. States he found low sugar ice cream he has sometimes. Fluid intake is low, patient reports ~1 bottle water and ~1 bottle Propel water per day. States it is difficult for him to tolerate water in general, even small amounts at a time. States he cannot do much physical activity without feeling his surgical sites. Advised to wait for MD approval for exercise. States he experienced some lightheadedness which has improved over the past couple of days, thinks his food intake was too low.   24-Hr Dietary Recall First Meal: protein shake Snack: -  Second Meal: beans (or beef, or chicken)  Snack: ice cream  Third Meal: chicken  Snack: - Beverages: Propel, water  Estimated daily fluid intake: 32 oz Estimated daily protein intake: 80 g Supplements: chewable bariatric MVI, Tums  Current average weekly physical activity: walking   Post-Op Goals/ Signs/ Symptoms Using straws: no Drinking while eating: no Chewing/swallowing difficulties: no Changes in vision: no Changes to mood/headaches: no Hair loss/changes to skin/nails: no Difficulty focusing/concentrating: no Sweating: no Dizziness/lightheadedness: yes (gotten better over past few days)  Palpitations: no  Carbonated/caffeinated beverages:  no N/V/D/C/Gas: no Abdominal pain: no Dumping syndrome: no   NUTRITION DIAGNOSIS  Overweight/obesity ([place]-3.3) related to past poor dietary habits and physical inactivity as evidenced by completed bariatric surgery and following dietary guidelines for continued weight loss and healthy nutrition status.   NUTRITION INTERVENTION Nutrition counseling (C-1) and education (E-2) to facilitate bariatric surgery goals, including: . Diet advancement to the next phase (phase 4) now including non-starchy vegetables  . The importance of consuming adequate calories as well as certain nutrients daily due to the body's need for essential vitamins, minerals, and fats . The importance of daily physical activity and to reach a goal of at least 150 minutes of moderate to vigorous physical activity weekly (or as directed by their physician) due to benefits such as increased musculature and improved lab values  Handouts Provided Include   Phase 4: Protein + Non-Starchy Vegetables   Protein Foods List  Learning Style & Readiness for Change Teaching method utilized: Visual & Auditory  Demonstrated degree of understanding via: Teach Back  Barriers to learning/adherence to lifestyle change: None Identified    MONITORING & EVALUATION Dietary intake, weekly physical activity, body weight, and goals in 2 months.  Next Steps Patient is to follow-up in 2 months for 4 month post-op follow-up.

## 2019-11-16 NOTE — Patient Instructions (Signed)
.   Continue to aim for a minimum of 64 fluid ounces daily with at least 32 ounces being plain water . Eat non-starchy vegetables 2 times a day 7 days a week . Start out with soft cooked vegetables today and tomorrow; if tolerated, begin to eat raw vegetables including salads . Per meal/snack, eat 3 ounces of protein first then non-starchy vegetables o Once you understand how much of your meal leads to satisfaction (not full) while still eating 3 ounces of protein and non-starchy vegetables, you can eat them in any order (figure out how much you can eat at a time to get enough but not too much)  . Continue to aim for 30 minutes of physical activity at least 5 times a week o Check in with your surgeon before you do more intense exercises.  . Remember to take 3 calcium's plus your bariatric multivitamin DAILY. If you haven't already, you may now switch from a gummy/chewable to a capsule.

## 2019-11-22 ENCOUNTER — Ambulatory Visit
Admission: RE | Admit: 2019-11-22 | Discharge: 2019-11-22 | Disposition: A | Discharge status: Home / Self Care | Payer: Self-pay | Source: Ambulatory Visit | Attending: Surgery | Admitting: Surgery

## 2019-11-22 ENCOUNTER — Ambulatory Visit: Admission: RE | Admit: 2019-11-22 | Payer: Self-pay | Source: Ambulatory Visit

## 2019-11-22 ENCOUNTER — Other Ambulatory Visit: Payer: Self-pay

## 2019-11-22 DIAGNOSIS — Z9884 Bariatric surgery status: Secondary | ICD-10-CM | POA: Insufficient documentation

## 2019-11-22 MED ORDER — IOHEXOL 300 MG/ML  SOLN
75.0000 mL | Freq: Once | INTRAMUSCULAR | Status: AC | PRN
  Administered 2019-11-22: 75 mL via ORAL

## 2020-01-16 ENCOUNTER — Ambulatory Visit: Payer: Self-pay | Admitting: Skilled Nursing Facility1

## 2020-06-13 ENCOUNTER — Ambulatory Visit
Admission: EM | Admit: 2020-06-13 | Discharge: 2020-06-13 | Disposition: A | Discharge status: Home / Self Care | Payer: Self-pay | Attending: Emergency Medicine | Admitting: Emergency Medicine

## 2020-06-13 ENCOUNTER — Other Ambulatory Visit: Payer: Self-pay

## 2020-06-13 DIAGNOSIS — L239 Allergic contact dermatitis, unspecified cause: Secondary | ICD-10-CM

## 2020-06-13 MED ORDER — PREDNISONE 10 MG PO TABS
ORAL_TABLET | ORAL | 0 refills | Status: DC
Start: 2020-06-13 — End: 2021-11-12

## 2020-06-13 MED ORDER — METHYLPREDNISOLONE SODIUM SUCC 125 MG IJ SOLR
125.0000 mg | Freq: Once | INTRAMUSCULAR | Status: AC
  Administered 2020-06-13: 125 mg via INTRAMUSCULAR

## 2020-06-13 MED ORDER — CETIRIZINE HCL 10 MG PO CAPS: 10.0000 mg | ORAL_CAPSULE | Freq: Every day | ORAL | 0 refills | Status: AC

## 2020-06-13 NOTE — ED Provider Notes (Signed)
[name]    CSN: [id number] Arrival date & time: 06/13/20  1944      History   Chief Complaint Chief Complaint  Patient presents with  . Rash    HPI Brad Hughes is a 45 y.o. male presenting today for evaluation of rash.  Reports itchy rash to neck groin and face beginning yesterday.  Denies known new exposures.  Denies exposure to woods/plants.  Denies history of similar.  Denies difficulty breathing. HPI  Past Medical History:  Diagnosis Date  . Anal fistula 2017  . Hyperlipidemia   . 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 Acute hypoxemic respiratory failure (HCC) 09/24/2019  . Pulmonary infiltrates 09/24/2019  . Sepsis following intra-abdominal surgery (HCC) 09/24/2019  . Dehydration 09/23/2019  . Post-operative state 09/22/2019    Past Surgical History:  Procedure Laterality Date  . ANAL FISTULOTOMY N/A 12/26/2015   Procedure: FISTULOTOMY;  Surgeon: Romie Levee, MD;  Location: [facility];  Service: General;  Laterality: N/A;  . IR RADIOLOGIST EVAL & MGMT  10/24/2019  . LAPAROSCOPY N/A 09/23/2019   Procedure: LAPAROSCOPY DIAGNOSTIC, EGD, Drainage of intra-abdominal abscess;  Surgeon: Harriette Bouillon, MD;  Location: [facility];  Service: General;  Laterality: N/A;  . RECTAL EXAM UNDER ANESTHESIA  2015  . RECTAL EXAM UNDER ANESTHESIA N/A 12/26/2015   Procedure: RECTAL EXAM UNDER ANESTHESIA;  Surgeon: Romie Levee, MD;  Location: [facility];  Service: General;  Laterality: N/A;  . ROUX-EN-Y GASTRIC BYPASS  09/20/2019   in [place]   . TREATMENT FISTULA ANAL         Home Medications    Prior to Admission medications   Medication Sig Start Date End Date Taking? Authorizing Provider  Cetirizine HCl 10 MG CAPS Take 1 capsule (10  mg total) by mouth daily for 10 days. 06/13/20 06/23/20 Yes Mata Rowen C, PA-C  predniSONE (DELTASONE) 10 MG tablet Begin with 6 tabs on day 1, 5 tab on day 2, 4 tab on day 3, 3 tab on day 4, 2 tab on day 5, 1 tab on day 6-take with food 06/13/20  Yes Grisell Bissette C, PA-C  acetaminophen (TYLENOL) 325 MG tablet Take 2 tablets (650 mg total) by mouth every 6 (six) hours as needed for mild pain. 10/12/19   Meuth, Lina Sar, PA-C  hyoscyamine (LEVSIN SL) 0.125 MG SL tablet Place 0.125 mg under the tongue every 6 (six) hours. 09/19/19   [provider]    Family History History reviewed. No pertinent family history.  Social History Social History   Tobacco Use  . Smoking status: Former Smoker    Types: Cigarettes    Quit date: 12/22/2008    Years since quitting: 11.4  . Smokeless tobacco: Never Used  Substance Use Topics  . Alcohol use: No     Allergies   Patient has no known allergies.   Review of Systems Review of Systems  Constitutional: Negative for fatigue and fever.  Eyes: Negative for redness, itching and visual disturbance.  Respiratory: Negative for shortness of breath.   Cardiovascular: Negative for chest pain and leg swelling.  Gastrointestinal: Negative for nausea and vomiting.  Musculoskeletal: Negative for arthralgias and myalgias.  Skin: Positive for color change and rash. Negative for wound.  Neurological: Negative  for dizziness, syncope, weakness, light-headedness and headaches.     Physical Exam Triage Vital Signs ED Triage Vitals  Enc Vitals Group     BP 06/13/20 2000 129/79     Pulse Rate 06/13/20 2000 80     Resp 06/13/20 2000 18     Temp 06/13/20 2000 98.1 F (36.7 C)     Temp Source 06/13/20 2000 Oral     SpO2 06/13/20 2000 97 %     Weight --      Height --      Head Circumference --      Peak Flow --      Pain Score 06/13/20 2001 0     Pain Loc --      Pain Edu? --      Excl. in GC? --    No data found.  Updated Vital Signs BP  129/79 (BP Location: Right Arm)   Pulse 80   Temp 98.1 F (36.7 C) (Oral)   Resp 18   SpO2 97%   Visual Acuity Right Eye Distance:   Left Eye Distance:   Bilateral Distance:    Right Eye Near:   Left Eye Near:    Bilateral Near:     Physical Exam Vitals and nursing note reviewed.  Constitutional:      Appearance: He is well-developed.     Comments: No acute distress  HENT:     Head: Normocephalic and atraumatic.     Nose: Nose normal.  Eyes:     Conjunctiva/sclera: Conjunctivae normal.  Cardiovascular:     Rate and Rhythm: Normal rate.  Pulmonary:     Effort: Pulmonary effort is normal. No respiratory distress.  Abdominal:     General: There is no distension.  Musculoskeletal:        General: Normal range of motion.     Cervical back: Neck supple.  Skin:    General: Skin is warm and dry.     Comments: Erythematous maculopapular rash noted to neck, small area underneath right eye, small area to right forearm as well as along shaft of penis and scrotum  Neurological:     Mental Status: He is alert and oriented to person, place, and time.      UC Treatments / Results  Labs (all labs ordered are listed, but only abnormal results are displayed) Labs Reviewed - No data to display  EKG   Radiology No results found.  Procedures Procedures (including critical care time)  Medications Ordered in UC Medications  methylPREDNISolone sodium succinate (SOLU-MEDROL) 125 mg/2 mL injection 125 mg (has no administration in time range)    Initial Impression / Assessment and Plan / UC Course  I have reviewed the triage vital signs and the nursing notes.  Pertinent labs & imaging results that were available during my care of the patient were reviewed by me and considered in my medical decision making (see chart for details).     Rash appears most consistent with a contact dermatitis, unclear trigger at this time, treating with Solu-Medrol IM prior to discharge and  continuing on prednisone taper x6 days, recommended antihistamines to supplement.  Monitor for resolution, patient to return for follow-up if not improving or worsening.  Discussed strict return precautions. Patient verbalized understanding and is agreeable with plan.  Final Clinical Impressions(s) / UC Diagnoses   Final diagnoses:  Allergic contact dermatitis, unspecified trigger     Discharge Instructions     We gave you a shot of Solu-Medrol  Continue with prednisone taper x6 days beginning tomorrow morning-begin with 6 tablets on day 1, Decrease by 1 tablet each day until complete-6, 5, 4, 3, 2, 1-take with food and earlier in the day if possible Please also take daily cetirizine/Zyrtec, supplement Benadryl in the evening.    ED Prescriptions    Medication Sig Dispense Auth. Provider   predniSONE (DELTASONE) 10 MG tablet Begin with 6 tabs on day 1, 5 tab on day 2, 4 tab on day 3, 3 tab on day 4, 2 tab on day 5, 1 tab on day 6-take with food 21 tablet Kaianna Dolezal C, PA-C   Cetirizine HCl 10 MG CAPS Take 1 capsule (10 mg total) by mouth daily for 10 days. 10 capsule Abigail Marsiglia, [place] C, PA-C     PDMP not reviewed this encounter.   Lew Dawes, [place] 06/13/20 2017

## 2020-06-13 NOTE — Discharge Instructions (Addendum)
We gave you a shot of Solu-Medrol Continue with prednisone taper x6 days beginning tomorrow morning-begin with 6 tablets on day 1, Decrease by 1 tablet each day until complete-6, 5, 4, 3, 2, 1-take with food and earlier in the day if possible Please also take daily cetirizine/Zyrtec, supplement Benadryl in the evening.

## 2020-06-13 NOTE — ED Triage Notes (Signed)
Pt c/o itchy rash to back of neck, groin area, and face since yesterday.

## 2020-10-01 ENCOUNTER — Encounter: Payer: Self-pay | Admitting: Emergency Medicine

## 2020-10-01 ENCOUNTER — Other Ambulatory Visit: Payer: Self-pay

## 2020-10-01 ENCOUNTER — Emergency Department (HOSPITAL_COMMUNITY)
Admission: EM | Admit: 2020-10-01 | Discharge: 2020-10-02 | Disposition: A | Discharge status: Home / Self Care | Payer: Self-pay | Attending: Emergency Medicine | Admitting: Emergency Medicine

## 2020-10-01 ENCOUNTER — Ambulatory Visit
Admission: EM | Admit: 2020-10-01 | Discharge: 2020-10-01 | Disposition: A | Discharge status: Home / Self Care | Payer: Self-pay | Attending: Student | Admitting: Student

## 2020-10-01 DIAGNOSIS — Z87891 Personal history of nicotine dependence: Secondary | ICD-10-CM | POA: Insufficient documentation

## 2020-10-01 DIAGNOSIS — R42 Dizziness and giddiness: Secondary | ICD-10-CM | POA: Insufficient documentation

## 2020-10-01 LAB — COMPREHENSIVE METABOLIC PANEL
ALT: 39 U/L (ref 0–44)
AST: 29 U/L (ref 15–41)
Albumin: 4.1 g/dL (ref 3.5–5.0)
Alkaline Phosphatase: 81 U/L (ref 38–126)
Anion gap: 6 (ref 5–15)
BUN: 13 mg/dL (ref 6–20)
CO2: 26 mmol/L (ref 22–32)
Calcium: 8.9 mg/dL (ref 8.9–10.3)
Chloride: 105 mmol/L (ref 98–111)
Creatinine, Ser: 0.93 mg/dL (ref 0.61–1.24)
GFR, Estimated: >60 mL/min (ref >60)
Glucose, Bld: 107 mg/dL — ABNORMAL HIGH (ref 70–99)
Potassium: 3.4 mmol/L — ABNORMAL LOW (ref 3.5–5.1)
Sodium: 137 mmol/L (ref 135–145)
Total Bilirubin: 0.7 mg/dL (ref 0.3–1.2)
Total Protein: 6.7 g/dL (ref 6.5–8.1)

## 2020-10-01 LAB — URINALYSIS, ROUTINE W REFLEX MICROSCOPIC
Bilirubin Urine: NEGATIVE
Glucose, UA: NEGATIVE mg/dL
Hgb urine dipstick: NEGATIVE
Ketones, ur: NEGATIVE mg/dL
Leukocytes,Ua: NEGATIVE
Nitrite: NEGATIVE
Protein, ur: NEGATIVE mg/dL
Specific Gravity, Urine: 1.023 (ref 1.005–1.030)
pH: 6 (ref 5.0–8.0)

## 2020-10-01 LAB — CBC
HCT: 39 % (ref 39.0–52.0)
Hemoglobin: 13.8 g/dL (ref 13.0–17.0)
MCH: 30.3 pg (ref 26.0–34.0)
MCHC: 35.4 g/dL (ref 30.0–36.0)
MCV: 85.5 fL (ref 80.0–100.0)
Platelets: 160 10*3/uL (ref 150–400)
RBC: 4.56 MIL/uL (ref 4.22–5.81)
RDW: 12.6 % (ref 11.5–15.5)
WBC: 4.7 10*3/uL (ref 4.0–10.5)
nRBC: 0 % (ref 0.0–0.2)

## 2020-10-01 LAB — MAGNESIUM: Magnesium: 2.2 mg/dL (ref 1.7–2.4)

## 2020-10-01 LAB — CK: Total CK: 164 U/L (ref 49–397)

## 2020-10-01 NOTE — ED Provider Notes (Signed)
Emergency Medicine Provider Triage Evaluation Note  Brad Hughes , a 45 y.o. male  was evaluated in triage.  Pt complains of dizziness and lightheadedness, which she describes as feeling as if he might pass out.  Symptoms are worse with standing and with ambulation.  Symptoms have been coming and going for the last few weeks.  When he is feeling more lightheaded, he states that he feels that his vision "may go out".  He has noted some intermittent tingling in his left arm --sometimes in the upper arm, forearm, or fingers.  Symptoms seem to improve when he lifts his arm.  He has also noticed some tingling in his left leg, myalgias.  He reports an intermittent throbbing headache.  He has only been voided 1-2 times today.  He works outside in [employer].  No vomiting, fever, or chills.  Review of Systems  Positive: Lightheadedness, headache, paresthesias, visual changes, decreased urinary output Negative: Vomiting, fever, chills  Physical Exam  BP 119/76 (BP Location: Left Arm)   Pulse 67   Temp 97.7 F (36.5 C) (Oral)   Resp 16   SpO2 100%  Gen:   Awake, no distress   Resp:  Normal effort  MSK:   Moves extremities without difficulty  Other:  Right side of the smile is asymmetric, but patient states that this is baseline.  Cranial nerves II through XII are otherwise grossly intact.  Sensation is intact and equal throughout.  Good strength against resistance of the bilateral upper and lower extremities.  Equal grip strength and strength with dorsiflexion and plantarflexion.  Ambulates without ataxia.  Medical Decision Making  Medically screening exam initiated at 10:17 PM.  Appropriate orders placed.  Brad Hughes was informed that the remainder of the evaluation will be completed by another provider, this initial triage assessment does not replace that evaluation, and the importance of remaining in the ED until their evaluation is complete.  45 year old male sent from urgent care  for concern for strokelike symptoms.  He has been having lightheadedness intermittently for 3 weeks.  He has no focal neurologic deficits aside from an asymmetric smile on the right, but patient states that this is his baseline.  He is outside of the stroke window.  He will require further work-up and evaluation in the emergency department.   Frederik Pear A, PA-C 10/01/20 2221    Rozelle Logan, DO 10/01/20 2322

## 2020-10-01 NOTE — ED Triage Notes (Signed)
Pt seen at [facility] for dizziness x3wks, sent to ED to rule out CVA. Pt denies HA, no drift, droop, gaze or other stroke abnormality noted in triage. Pt states over 3wks, L arm would become numb/tingly & then get better.  Pt states dizziness worse when he gets up/stands suddenly.

## 2020-10-01 NOTE — ED Provider Notes (Addendum)
[name]    CSN: [id number] Arrival date & time: 10/01/20  1939      History   Chief Complaint Chief Complaint  Patient presents with   Dizziness    HPI Brad Hughes is a 45 y.o. male presenting with dizziness for 4 days with some left arm numbness and general malaise. Throbbing headache but denies worst headache of life, vision changes, thunderclap headache. He does also notes some tinnitus x3 weeks unchanged. Denies ear pain, recent URI, allergic rhinitis, fevers/chills. Denies focal weakness. Denies CP, shortness of breath.   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     Patient Active Problem List   Diagnosis Date Noted   Acute hypoxemic respiratory failure (HCC) 09/24/2019   Pulmonary infiltrates 09/24/2019   Sepsis following intra-abdominal surgery (HCC) 09/24/2019   Dehydration 09/23/2019   Post-operative state 09/22/2019    Past Surgical History:  Procedure Laterality Date   ANAL FISTULOTOMY N/A 12/26/2015   Procedure: FISTULOTOMY;  Surgeon: Romie Levee, MD;  Location: [facility];  Service: General;  Laterality: N/A;   IR RADIOLOGIST EVAL & MGMT  10/24/2019   LAPAROSCOPY N/A 09/23/2019   Procedure: LAPAROSCOPY DIAGNOSTIC, EGD, Drainage of intra-abdominal abscess;  Surgeon: Harriette Bouillon, MD;  Location: [facility];  Service: General;  Laterality: N/A;   RECTAL EXAM UNDER ANESTHESIA  2015   RECTAL EXAM UNDER ANESTHESIA N/A 12/26/2015   Procedure: RECTAL EXAM UNDER ANESTHESIA;  Surgeon: Romie Levee, MD;  Location: [facility];  Service: General;  Laterality: N/A;   ROUX-EN-Y GASTRIC BYPASS  09/20/2019   in [place]    TREATMENT FISTULA ANAL         Home Medications    Prior to Admission medications    Medication Sig Start Date End Date Taking? Authorizing Provider  acetaminophen (TYLENOL) 325 MG tablet Take 2 tablets (650 mg total) by mouth every 6 (six) hours as needed for mild pain. 10/12/19   Meuth, Brooke A, PA-C  Cetirizine HCl 10 MG CAPS Take 1 capsule (10 mg total) by mouth daily for 10 days. 06/13/20 06/23/20  Wieters, Hallie C, PA-C  hyoscyamine (LEVSIN SL) 0.125 MG SL tablet Place 0.125 mg under the tongue every 6 (six) hours. 09/19/19   [provider]  predniSONE (DELTASONE) 10 MG tablet Begin with 6 tabs on day 1, 5 tab on day 2, 4 tab on day 3, 3 tab on day 4, 2 tab on day 5, 1 tab on day 6-take with food Patient not taking: Reported on 10/01/2020 06/13/20   Lew Dawes, PA-C    Family History History reviewed. No pertinent family history.  Social History Social History   Tobacco Use   Smoking status: Former    Types: Cigarettes    Quit date: 12/22/2008    Years since quitting: 11.7   Smokeless tobacco: Never  Substance Use Topics   Alcohol use: No     Allergies   Patient has no known allergies.   Review of Systems Review of Systems  Constitutional:  Negative for appetite change, chills, fatigue and fever.  HENT:  Negative for congestion, sinus pressure, sore throat, trouble swallowing and voice change.   Eyes:  Negative for photophobia, pain, discharge, redness, itching and visual disturbance.  Respiratory:  Negative for cough, chest tightness and shortness of breath.   Cardiovascular:  Negative for chest pain, palpitations and leg swelling.  Gastrointestinal:  Negative for abdominal pain, constipation, diarrhea, nausea and vomiting.  Genitourinary:  Negative for dysuria, flank pain, frequency and urgency.  Musculoskeletal:  Negative for back pain, gait problem, myalgias, neck pain and neck stiffness.  Neurological:  Positive for dizziness and headaches. Negative for tremors, seizures, syncope, facial asymmetry, speech difficulty, weakness,  light-headedness and numbness.  Psychiatric/Behavioral:  Negative for agitation, decreased concentration, dysphoric mood, hallucinations and suicidal ideas. The patient is not nervous/anxious.   All other systems reviewed and are negative.   Physical Exam Triage Vital Signs ED Triage Vitals  Enc Vitals Group     BP 10/01/20 1953 112/69     Pulse Rate 10/01/20 1953 73     Resp 10/01/20 1953 18     Temp 10/01/20 1953 97.9 F (36.6 C)     Temp Source 10/01/20 1953 Oral     SpO2 10/01/20 1953 97 %     Weight --      Height --      Head Circumference --      Peak Flow --      Pain Score 10/01/20 1954 0     Pain Loc --      Pain Edu? --      Excl. in GC? --    No data found.  Updated Vital Signs BP 112/69 (BP Location: Left Arm)   Pulse 73   Temp 97.9 F (36.6 C) (Oral)   Resp 18   SpO2 97%   Visual Acuity Right Eye Distance:   Left Eye Distance:   Bilateral Distance:    Right Eye Near:   Left Eye Near:    Bilateral Near:     Physical Exam Vitals reviewed.  Constitutional:      General: He is not in acute distress.    Appearance: Normal appearance. He is not ill-appearing.  HENT:     Head: Normocephalic and atraumatic.     Right Ear: Hearing, tympanic membrane, ear canal and external ear normal. No swelling or tenderness. No middle ear effusion. There is no impacted cerumen. No mastoid tenderness. Tympanic membrane is not injected, scarred, perforated, erythematous, retracted or bulging.     Left Ear: Hearing, tympanic membrane, ear canal and external ear normal. No swelling or tenderness.  No middle ear effusion. There is no impacted cerumen. No mastoid tenderness. Tympanic membrane is not injected, scarred, perforated, erythematous, retracted or bulging.     Mouth/Throat:     Pharynx: Oropharynx is clear. No oropharyngeal exudate or posterior oropharyngeal erythema.  Eyes:     Extraocular Movements: Extraocular movements intact.     Pupils: Pupils are equal,  round, and reactive to light.  Cardiovascular:     Rate and Rhythm: Normal rate and regular rhythm.     Heart sounds: Normal heart sounds.  Pulmonary:     Effort: Pulmonary effort is normal.     Breath sounds: Normal breath sounds. No wheezing, rhonchi or rales.  Musculoskeletal:     Cervical back: Normal range of motion and neck supple. No rigidity.     Comments: No cervical spinous or paraspinous tenderness. Negative spurling.  Lymphadenopathy:     Cervical: No cervical adenopathy.  Skin:    Capillary Refill: Capillary refill takes less than 2 seconds.  Neurological:     General: No focal deficit present.     Mental  Status: He is alert and oriented to person, place, and time. Mental status is at baseline.     Cranial Nerves: Cranial nerves are intact. No cranial nerve deficit or facial asymmetry.     Sensory: Sensation is intact. No sensory deficit.     Motor: Motor function is intact. No weakness.     Coordination: Coordination is intact. Coordination normal.     Gait: Gait is intact. Gait normal.     Comments: CN 2-12 intact. No weakness or numbness in UEs or LEs. PERRLA, EOMI. Strength and sensation intact upper and lower extremities. Negative rhomberg, pronator drift. Negative fingers to thumb. Gait intact.   Psychiatric:        Mood and Affect: Mood normal.        Behavior: Behavior normal.        Thought Content: Thought content normal.        Judgment: Judgment normal.     UC Treatments / Results  Labs (all labs ordered are listed, but only abnormal results are displayed) Labs Reviewed - No data to display  EKG   Radiology No results found.  Procedures Procedures (including critical care time)  Medications Ordered in UC Medications - No data to display  Initial Impression / Assessment and Plan / UC Course  I have reviewed the triage vital signs and the nursing notes.  Pertinent labs & imaging results that were available during my care of the patient were  reviewed by me and considered in my medical decision making (see chart for details).     This patient is a very pleasant 45 y.o. year old male presenting with L arm tingling and dizziness x4 days. Afebrile, nontachy. Reassuring neuro exam, but dizziness is not consistent with BPPV, and L arm tingling is not reproducible. Discussed that I cannot rule out a CVA. Head straight to ED for this workup. Declines transport via EMS multiple times in favor of transport in personal vehicle. He understands that he could be having a stroke.   EKG NSR.  Final Clinical Impressions(s) / UC Diagnoses   Final diagnoses:  Dizziness     Discharge Instructions      -I cannot rule out  an acute stroke in the urgent care setting. Please head straight to the ED for this workup. If symptoms get worse on the day- dizziness, headaches, weakness- stop and call 911 immediately.     ED Prescriptions   None    PDMP not reviewed this encounter.   Rhys Martini, PA-C 10/01/20 2016    Rhys Martini, PA-C 10/01/20 2016    Rhys Martini, PA-C 10/01/20 2018

## 2020-10-01 NOTE — Discharge Instructions (Addendum)
-  I cannot rule out  an acute stroke in the urgent care setting. Please head straight to the ED for this workup. If symptoms get worse on the day- dizziness, headaches, weakness- stop and call 911 immediately.

## 2020-10-01 NOTE — ED Triage Notes (Signed)
Pt here for dizziness x 4 days with some left arm numbness and not feeling well; no obvious distress; pt sts some ringing in ears

## 2020-10-02 NOTE — ED Provider Notes (Signed)
[facility] EMERGENCY DEPARTMENT Provider Note  CSN: [id number] Arrival date & time: 10/01/20 2130  Chief Complaint(s) Dizziness  HPI Brad Hughes is a 45 y.o. male    Dizziness Quality:  Lightheadedness Severity: mild to moderate. Onset quality:  Gradual Duration:  3 weeks Progression:  Waxing and waning Chronicity:  New Context: standing up   Context: not with loss of consciousness   Relieved by:  Lying down (completely resolved) Worsened by:  Sitting upright and standing up Associated symptoms: headaches (mild and dull) and tinnitus (intermittent)   Associated symptoms: no blood in stool, no chest pain, no diarrhea, no hearing loss, no nausea, no palpitations, no shortness of breath, no syncope, no vision changes, no vomiting and no weakness   Risk factors: no heart disease, no hx of stroke, no multiple medications and no new medications     Past Medical History 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 Authorizing Provider  acetaminophen (TYLENOL) 325 MG tablet Take 2 tablets (650 mg total) by mouth every 6 (six) hours as needed for mild pain. 10/12/19   Meuth, Brooke A, PA-C  Cetirizine HCl 10 MG CAPS Take 1 capsule (10 mg total) by mouth daily for 10 days. 06/13/20 06/23/20  Wieters, Hallie C, PA-C  hyoscyamine (LEVSIN SL) 0.125 MG SL tablet Place 0.125 mg under the tongue every 6 (six)  hours. 09/19/19   [provider]  predniSONE (DELTASONE) 10 MG tablet Begin with 6 tabs on day 1, 5 tab on day 2, 4 tab on day 3, 3 tab on day 4, 2 tab on day 5, 1 tab on day 6-take with food Patient not taking: Reported on 10/01/2020 06/13/20   Lew Dawes, PA-C                                                                                                                                    Past Surgical History Past Surgical History:  Procedure Laterality Date   ANAL FISTULOTOMY N/A 12/26/2015   Procedure: FISTULOTOMY;  Surgeon: Romie Levee, MD;  Location: [facility];  Service: General;  Laterality: N/A;   IR RADIOLOGIST EVAL & MGMT  10/24/2019   LAPAROSCOPY N/A 09/23/2019   Procedure: LAPAROSCOPY DIAGNOSTIC, EGD, Drainage of intra-abdominal abscess;  Surgeon: Harriette Bouillon, MD;  Location: [facility];  Service: General;  Laterality: N/A;  RECTAL EXAM UNDER ANESTHESIA  2015   RECTAL EXAM UNDER ANESTHESIA N/A 12/26/2015   Procedure: RECTAL EXAM UNDER ANESTHESIA;  Surgeon: Romie Levee, MD;  Location: [facility];  Service: General;  Laterality: N/A;   ROUX-EN-Y GASTRIC BYPASS  09/20/2019   in [place]    TREATMENT FISTULA ANAL     Family History No family history on file.  Social History Social History   Tobacco Use   Smoking status: Former    Types: Cigarettes    Quit date: 12/22/2008    Years since quitting: 11.7   Smokeless tobacco: Never  Substance Use Topics   Alcohol use: No   Allergies Patient has no known allergies.  Review of Systems Review of Systems  HENT:  Positive for tinnitus (intermittent). Negative for hearing loss.   Respiratory:  Negative for shortness of breath.   Cardiovascular:  Negative for chest pain, palpitations and syncope.  Gastrointestinal:  Negative for blood in stool, diarrhea, nausea and vomiting.  Neurological:  Positive for dizziness and headaches (mild and dull). Negative for weakness.  All other  systems are reviewed and are negative for acute change except as noted in the HPI   Physical Exam Vital Signs  I have reviewed the triage vital signs BP 107/68 (BP Location: Right Arm)   Pulse 67   Temp 98.1 F (36.7 C) (Oral)   Resp 15   SpO2 100%   Physical Exam Vitals reviewed.  Constitutional:      General: He is not in acute distress.    Appearance: He is well-developed. He is not diaphoretic.  HENT:     Head: Normocephalic and atraumatic.     Nose: Nose normal.  Eyes:     General: No scleral icterus.       Right eye: No discharge.        Left eye: No discharge.     Conjunctiva/sclera: Conjunctivae normal.     Pupils: Pupils are equal, round, and reactive to light.  Cardiovascular:     Rate and Rhythm: Normal rate and regular rhythm.     Heart sounds: No murmur heard.   No friction rub. No gallop.  Pulmonary:     Effort: Pulmonary effort is normal. No respiratory distress.     Breath sounds: Normal breath sounds. No stridor. No rales.  Abdominal:     General: There is no distension.     Palpations: Abdomen is soft.     Tenderness: There is no abdominal tenderness.  Musculoskeletal:        General: No tenderness.     Cervical back: Normal range of motion and neck supple.  Skin:    General: Skin is warm and dry.     Findings: No erythema or rash.  Neurological:     Mental Status: He is alert and oriented to person, place, and time.     Comments: Mental Status:  Alert and oriented to person, place, and time.  Attention and concentration normal.  Speech clear.  Recent memory is intact  Cranial Nerves:  II Visual Fields: Intact to confrontation. Visual fields intact. III, IV, VI: Pupils equal and reactive to light and near. Full eye movement without nystagmus  V Facial Sensation: Normal. No weakness of masticatory muscles  VII: No facial weakness or asymmetry  VIII Auditory Acuity: Grossly normal  IX/X: The uvula is midline; the palate elevates symmetrically   XI: Normal sternocleidomastoid and trapezius strength  XII: The tongue is midline. No atrophy or fasciculations.  Motor System: Muscle Strength: 5/5 and symmetric in the upper and lower extremities. No pronation or drift.  Muscle Tone: Tone and muscle bulk are normal in the upper and lower extremities.  Reflexes: DTRs: 1+ and symmetrical in all four extremities. No Clonus Coordination: Intact finger-to-nose, heel-to-shin. No tremor.  Sensation: Intact to light touch.  Gait: Routine and tandem gait normal.     ED Results and Treatments Labs (all labs ordered are listed, but only abnormal results are displayed) Labs Reviewed  COMPREHENSIVE METABOLIC PANEL - Abnormal; Notable for the following components:      Result Value   Potassium 3.4 (*)    Glucose, Bld 107 (*)    All other components within normal limits  CBC  CK  URINALYSIS, ROUTINE W REFLEX MICROSCOPIC  MAGNESIUM                                                                                                                         EKG  EKG Interpretation  Date/Time: October 01, 2020 22:00    Ventricular Rate: 66   PR Interval: 164   QRS Duration: 90 QT Interval: 384   QTC Calculation: 402 R Axis: 58    Text Interpretation: Normal sinus rhythm.  No acute changes.       Radiology No results found.  Pertinent labs & imaging results that were available during my care of the patient were reviewed by me and considered in my medical decision making (see chart for details).  Medications Ordered in ED Medications - No data to display                                                                                                                                  Procedures Procedures  (including critical care time)  Medical Decision Making / ED Course I have reviewed the nursing notes for this encounter and the patient's prior records (if available in EHR or on provided paperwork).   Brad Hughes was evaluated  in Emergency Department on 10/02/2020 for the symptoms described in the history of present illness. He was evaluated in the context of the global COVID-19 pandemic, which necessitated consideration that the patient might be at risk for infection with the SARS-CoV-2 virus that causes COVID-19. Institutional protocols and algorithms that pertain to the evaluation of patients at risk for COVID-19 are in a state of rapid change based on information released  by regulatory bodies including the CDC and federal and state organizations. These policies and algorithms were followed during the patient's care in the ED.  3 weeks of positional lightheadedness.  Reports that at times it feels like he is going to pass out upon standing. Reports that he works in [employer] and does not tend to hydrate well. Seen at urgent care and sent here to be evaluated for possible stroke. Symptoms are not consistent with CVA.  CBC without leukocytosis or anemia.  No significant electrolyte derangements or renal sufficiency EKG without ischemic changes, dysrhythmias or blocks.  Given the positional/orthostatic nature of patient's symptoms, feel that this is likely due to hydration issues.       Final Clinical Impression(s) / ED Diagnoses Final diagnoses:  Lightheadedness   The patient appears reasonably screened and/or stabilized for discharge and I doubt any other medical condition or other [facility] requiring further screening, evaluation, or treatment in the ED at this time prior to discharge. Safe for discharge with strict return precautions.  Disposition: Discharge  Condition: Good  I have discussed the results, Dx and Tx plan with the patient/family who expressed understanding and agree(s) with the plan. Discharge instructions discussed at length. The patient/family was given strict return precautions who verbalized understanding of the instructions. No further questions at time of discharge.    ED Discharge Orders      None        Follow Up: Primary care provider  Schedule an appointment as soon as possible for a visit  if you do not have a primary care physician, contact [facility] at [phone] for referral     This chart was dictated using voice recognition software.  Despite best efforts to proofread,  errors can occur which can change the documentation meaning.    Nira Connardama, Ferlin Fairhurst Eduardo, MD 10/02/20 [phone]

## 2020-10-19 IMAGING — RF DG UGI W SINGLE CM
7 of 17 series · 8 of 24 positions shown · non-contrast
Comparison: CT abdomen pelvis 10/05/2019, 09/29/2019, 09/22/2019

CLINICAL DATA: Gastric bypass and Roux-en-Y 09/19/2019. Bowel leak
following surgery with intra-abdominal abscesses. Status post
abscess drainage.

EXAM:
WATER SOLUBLE UPPER GI SERIES
TECHNIQUE: Single-column upper GI series was performed using water soluble
contrast.

[Series 1: t abdomen supine · 0.15mm/px · 1 of 1 slices shown (1 of 2)]
[im 1/1]
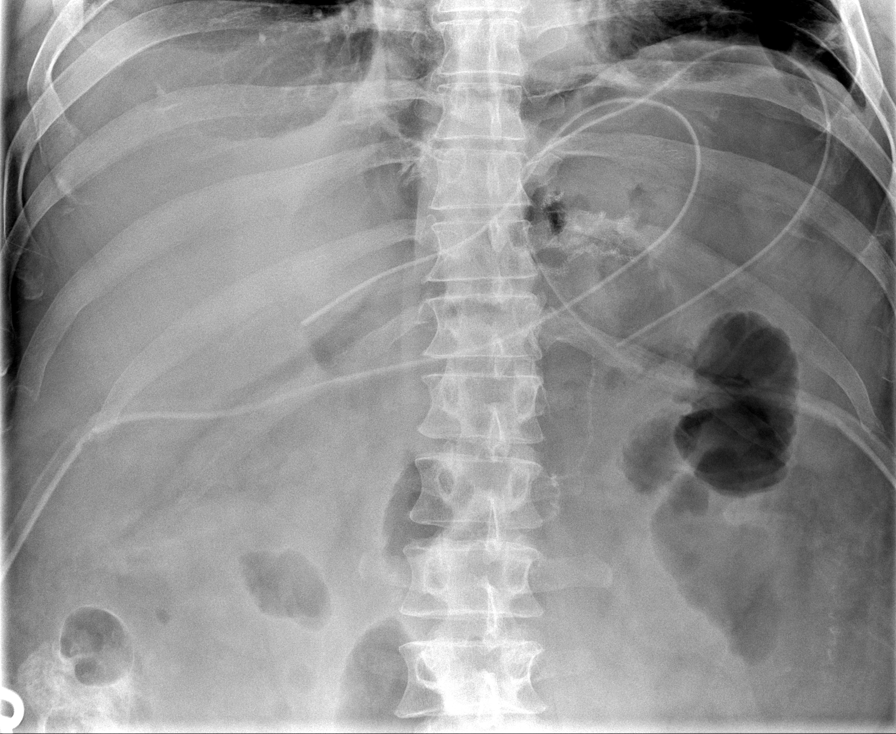

[Series 2: t abdomen supine · 0.15mm/px · 1 of 1 slices shown (2 of 2)]
[im 1/1]
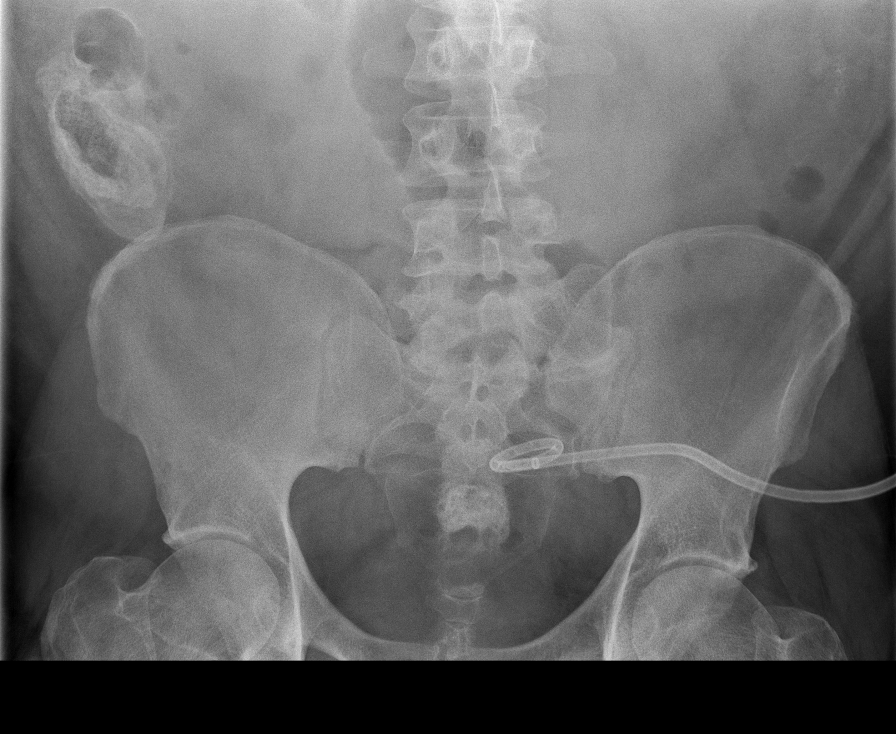

[Series 3: cp_standard · 0.53mm/px · 1 of 115 frames shown]
[frame 58/115]
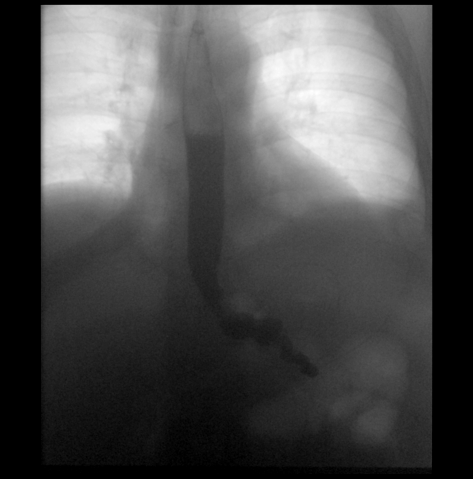

[Series 5: fluoro_barium 2fps_bw · 0.18mm/px · 2 of 2 frames shown (1 of 4)]
[frame 1/2]
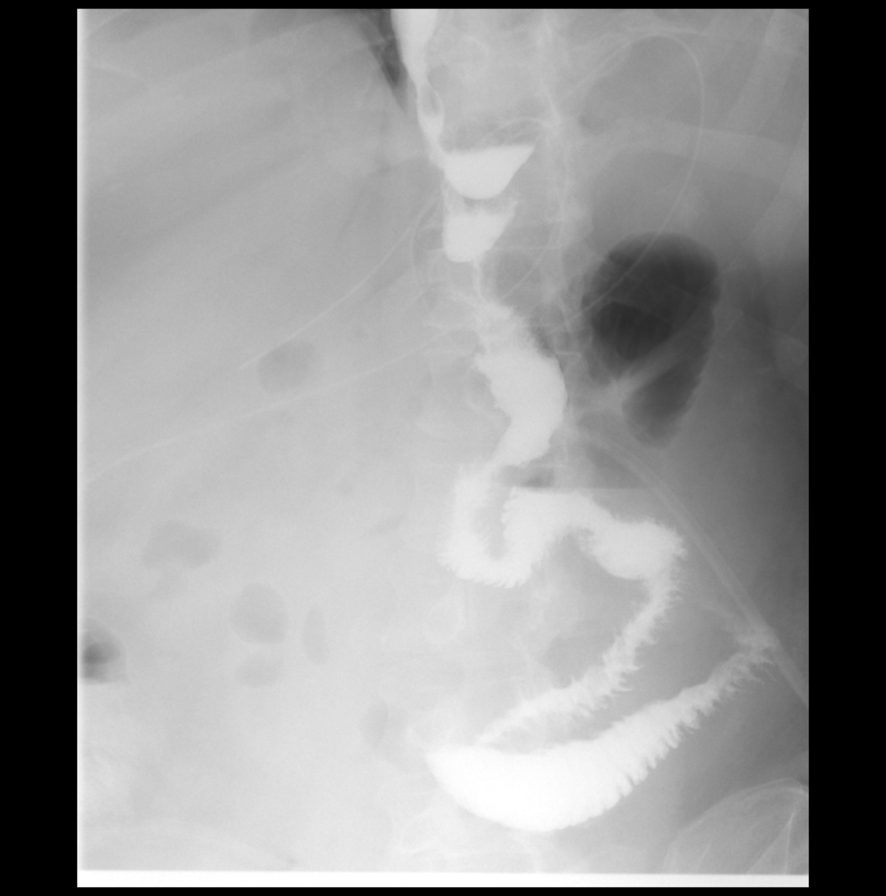
[frame 2/2]
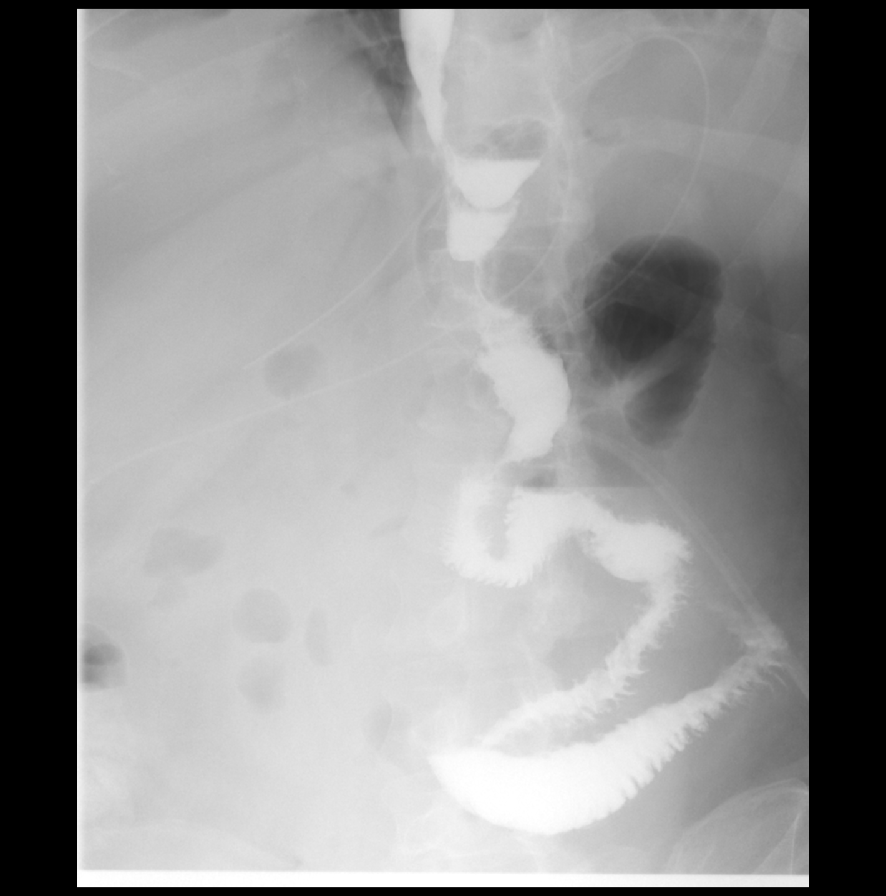

[Series 6: fluoro_barium 2fps_bw · 0.18mm/px · 1 of 1 slices shown (2 of 4)]
[im 1/1]
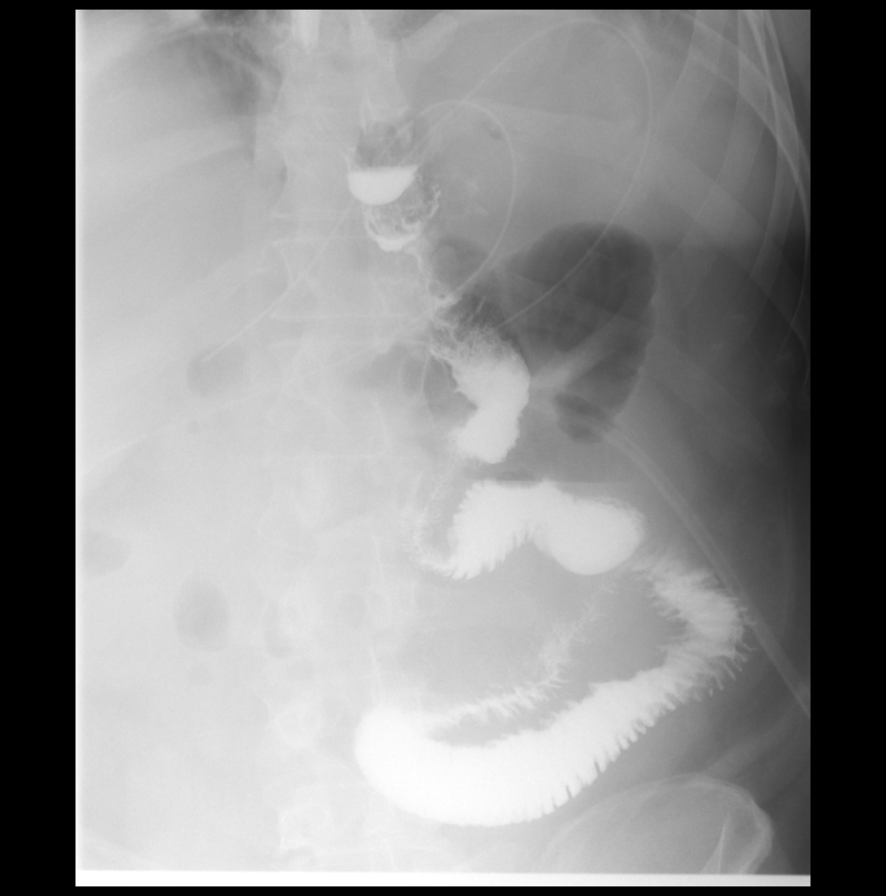

[Series 7: fluoro_barium 2fps_bw · 0.18mm/px · 1 of 1 slices shown (3 of 4)]
[im 1/1]
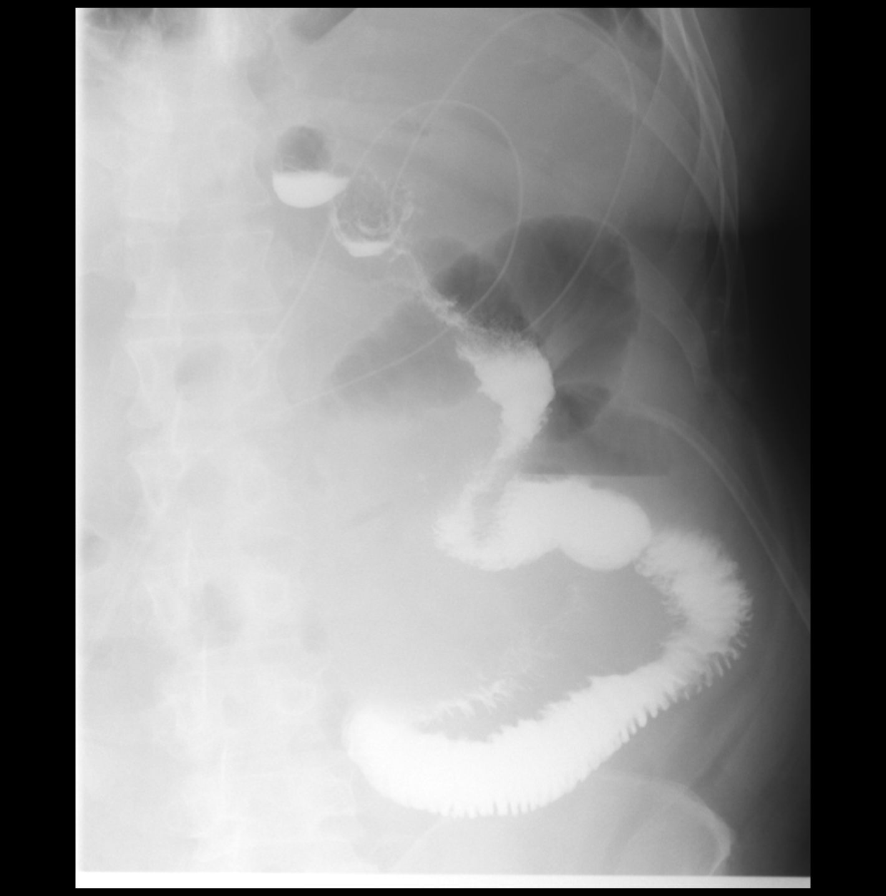

[Series 9: fluoro_barium 2fps_bw · 0.18mm/px · 1 of 1 slices shown (4 of 4)]
[im 1/1]
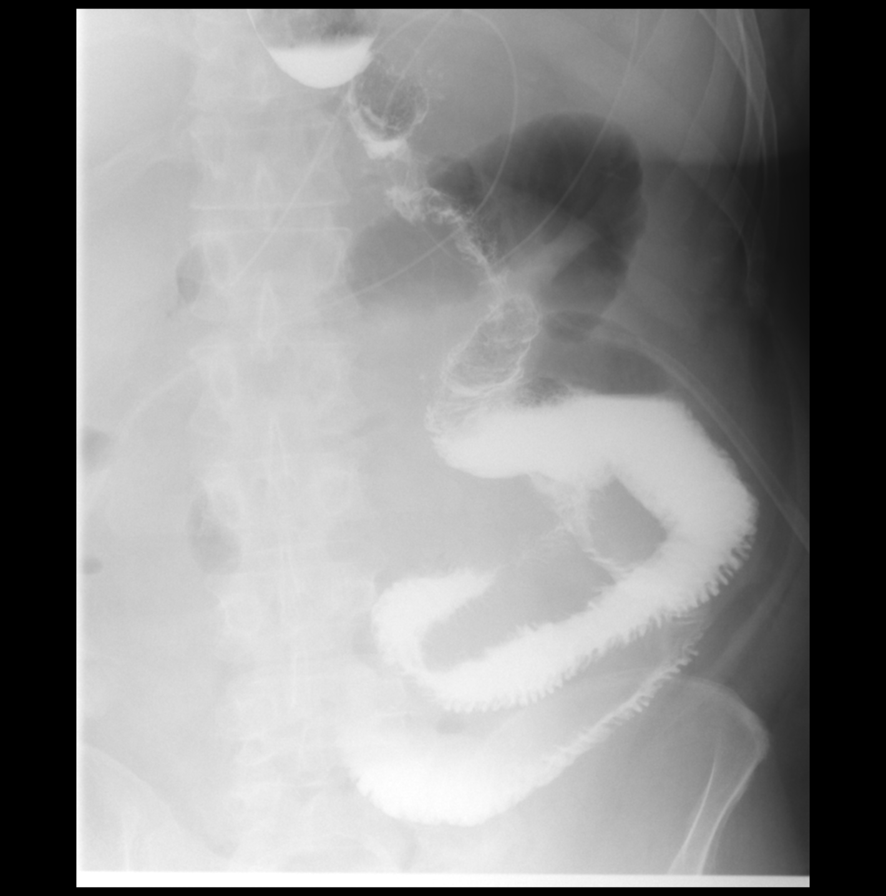

[8 of 24 positions shown; findings below may reference images not displayed]

FLUOROSCOPY TIME:  Fluoroscopy Time:  2 minutes 30 second

Radiation Exposure Index (if provided by the fluoroscopic device):

Number of Acquired Spot Images: 0
FINDINGS: Esophageal mucosa and motility normal. Postop gastric bypass with
Roux-en-Y appears appropriate post surgery. There is normal emptying
into the Roux-en-Y. No leak identified. Contrast was followed
through proximal small bowel without leak.

There is dilatation of the jejunum similar to that seen on prior CT.

Pigtail drain in the pelvis.  Surgical drains in the upper abdomen.
IMPRESSION: Negative for leak.  Postop gastrectomy and Roux-en-Y.

Dilatation of the jejunum compatible with postop ileus versus
obstruction.

## 2020-12-02 ENCOUNTER — Encounter: Payer: Self-pay | Admitting: Emergency Medicine

## 2020-12-02 ENCOUNTER — Other Ambulatory Visit: Payer: Self-pay

## 2020-12-02 ENCOUNTER — Ambulatory Visit
Admission: EM | Admit: 2020-12-02 | Discharge: 2020-12-02 | Disposition: A | Discharge status: Home / Self Care | Payer: Self-pay | Attending: Urgent Care | Admitting: Urgent Care

## 2020-12-02 DIAGNOSIS — B029 Zoster without complications: Secondary | ICD-10-CM

## 2020-12-02 MED ORDER — VALACYCLOVIR HCL 1 G PO TABS: 1000.0000 mg | ORAL_TABLET | Freq: Three times a day (TID) | ORAL | 0 refills | Status: DC

## 2020-12-02 MED ORDER — GABAPENTIN 100 MG PO CAPS: 100.0000 mg | ORAL_CAPSULE | Freq: Three times a day (TID) | ORAL | 0 refills | Status: DC

## 2020-12-02 NOTE — Discharge Instructions (Addendum)
Please use Tylenol at a dose of 500mg -650mg  once every 6 hours as needed for your aches, pains, fevers.  Use valacyclovir for Shingles. You can also use gabapentin for nerve type pain.

## 2020-12-02 NOTE — ED Triage Notes (Signed)
Vesicular rash on right side of face extending from upper lip over cheek. C/o of sharp pain from rash with mild itching

## 2020-12-02 NOTE — ED Provider Notes (Signed)
[facility]   MRN: [id number] DOB: 28-Apr-1975  Subjective:   Brad Hughes is a 45 y.o. male presenting for several day history of a rash over the right side of his face.  Symptoms started over the right upper lip but now.  Symptoms over his nose and the right side of his cheek.  Reports that he has some intermittent sharp pains, tingling.  No fever, facial droop, ear pain, eye pain, rash of the eye of her ear.  No current facility-administered medications for this encounter.  Current Outpatient Medications:    acetaminophen (TYLENOL) 325 MG tablet, Take 2 tablets (650 mg total) by mouth every 6 (six) hours as needed for mild pain., Disp: , Rfl:    Cetirizine HCl 10 MG CAPS, Take 1 capsule (10 mg total) by mouth daily for 10 days., Disp: 10 capsule, Rfl: 0   hyoscyamine (LEVSIN SL) 0.125 MG SL tablet, Place 0.125 mg under the tongue every 6 (six) hours., Disp: , Rfl:    predniSONE (DELTASONE) 10 MG tablet, Begin with 6 tabs on day 1, 5 tab on day 2, 4 tab on day 3, 3 tab on day 4, 2 tab on day 5, 1 tab on day 6-take with food (Patient not taking: Reported on 10/01/2020), Disp: 21 tablet, Rfl: 0   No Known Allergies  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      Past Surgical History:  Procedure Laterality Date   ANAL FISTULOTOMY N/A 12/26/2015   Procedure: FISTULOTOMY;  Surgeon: Romie Levee, MD;  Location: [facility];  Service: General;  Laterality: N/A;   IR RADIOLOGIST EVAL & MGMT  10/24/2019   LAPAROSCOPY N/A 09/23/2019   Procedure: LAPAROSCOPY DIAGNOSTIC, EGD, Drainage of intra-abdominal abscess;  Surgeon: Harriette Bouillon, MD;  Location: [facility];  Service: General;  Laterality: N/A;   RECTAL EXAM UNDER ANESTHESIA  2015   RECTAL EXAM UNDER  ANESTHESIA N/A 12/26/2015   Procedure: RECTAL EXAM UNDER ANESTHESIA;  Surgeon: Romie Levee, MD;  Location: [facility] [place];  Service: General;  Laterality: N/A;   ROUX-EN-Y GASTRIC BYPASS  09/20/2019   in [place]    TREATMENT FISTULA ANAL      History reviewed. No pertinent family history.  Social History   Tobacco Use   Smoking status: Former    Types: Cigarettes    Quit date: 12/22/2008    Years since quitting: 11.9   Smokeless tobacco: Never  Substance Use Topics   Alcohol use: No    ROS   Objective:   Vitals: BP 109/72 (BP Location: Left Arm)   Pulse 70   Temp 98.3 F (36.8 C) (Oral)   Resp 16   SpO2 96%   Physical Exam Constitutional:      General: He is not in acute distress.    Appearance: Normal appearance. He is well-developed and normal weight. He is not ill-appearing, toxic-appearing or diaphoretic.  HENT:     Head: Normocephalic and atraumatic.      Right Ear: Tympanic membrane, ear canal and external ear normal. There is no impacted cerumen.     Left Ear: Tympanic membrane, ear canal and external ear normal. There is no impacted cerumen.     Nose: Nose normal. No congestion or rhinorrhea.  Mouth/Throat:     Mouth: Mucous membranes are moist.     Pharynx: Oropharynx is clear. No oropharyngeal exudate or posterior oropharyngeal erythema.  Eyes:     General: No scleral icterus.       Right eye: No discharge.        Left eye: No discharge.     Extraocular Movements: Extraocular movements intact.     Conjunctiva/sclera: Conjunctivae normal.     Pupils: Pupils are equal, round, and reactive to light.  Cardiovascular:     Rate and Rhythm: Normal rate.  Pulmonary:     Effort: Pulmonary effort is normal.  Musculoskeletal:     Cervical back: Normal range of motion and neck supple. No rigidity. No muscular tenderness.  Skin:    Findings: Rash present.  Neurological:     General: No focal deficit present.     Mental Status: He is alert  and oriented to person, place, and time.     Cranial Nerves: No cranial nerve deficit.     Motor: No weakness.     Coordination: Coordination normal.     Gait: Gait normal.     Deep Tendon Reflexes: Reflexes normal.     Comments: No facial asymmetry.  Psychiatric:        Mood and Affect: Mood normal.        Behavior: Behavior normal.        Thought Content: Thought content normal.        Judgment: Judgment normal.     Assessment and Plan :   PDMP not reviewed this encounter.  1. Herpes zoster without complication     Will manage for shingles with valacyclovir, gabapentin.  Schedule Tylenol and ibuprofen as needed for pain.  Counseled on possible complications including herpes zoster ophthalmicus, Ramsay Hunt syndrome, herpes zoster [place].  Recommended he follow-up with [place] immediately if this progresses. Counseled patient on potential for adverse effects with medications prescribed/recommended today, ER and return-to-clinic precautions discussed, patient verbalized understanding.    Wallis Bamberg, [place] 12/02/20 1829

## 2021-03-10 ENCOUNTER — Ambulatory Visit
Admission: EM | Admit: 2021-03-10 | Discharge: 2021-03-10 | Disposition: A | Discharge status: Home / Self Care | Payer: Self-pay | Attending: Physician Assistant | Admitting: Physician Assistant

## 2021-03-10 ENCOUNTER — Other Ambulatory Visit: Payer: Self-pay

## 2021-03-10 ENCOUNTER — Encounter: Payer: Self-pay | Admitting: Emergency Medicine

## 2021-03-10 DIAGNOSIS — B029 Zoster without complications: Secondary | ICD-10-CM

## 2021-03-10 MED ORDER — VALACYCLOVIR HCL 1 G PO TABS: 1000.0000 mg | ORAL_TABLET | Freq: Three times a day (TID) | ORAL | 0 refills | Status: AC

## 2021-03-10 NOTE — ED Provider Notes (Signed)
[name]    CSN: [id number] Arrival date & time: 03/10/21  1333      History   Chief Complaint Chief Complaint  Patient presents with   Rash    HPI TAMIKA MOHN is a 46 y.o. male.   Patient here today for evaluation of possible recurrence of shingles. He reports that in September of last year he had shingles to the right side of his face and this morning he woke up with same sharp pains a few red "spots". He does not report fever.  The history is provided by the patient.  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     Patient Active Problem List   Diagnosis Date Noted   Acute hypoxemic respiratory failure ([place]) 09/24/2019   Pulmonary infiltrates 09/24/2019   Sepsis following intra-abdominal surgery ([place]) 09/24/2019   Dehydration 09/23/2019   Post-operative state 09/22/2019    Past Surgical History:  Procedure Laterality Date   ANAL FISTULOTOMY N/A 12/26/2015   Procedure: FISTULOTOMY;  Surgeon: Leighton Ruff, MD;  Location: [facility];  Service: General;  Laterality: N/A;   IR RADIOLOGIST EVAL & MGMT  10/24/2019   LAPAROSCOPY N/A 09/23/2019   Procedure: LAPAROSCOPY DIAGNOSTIC, EGD, Drainage of intra-abdominal abscess;  Surgeon: Erroll Luna, MD;  Location: [facility];  Service: General;  Laterality: N/A;   RECTAL EXAM UNDER ANESTHESIA  2015   RECTAL EXAM UNDER ANESTHESIA N/A 12/26/2015   Procedure: RECTAL EXAM UNDER ANESTHESIA;  Surgeon: Leighton Ruff, MD;  Location: [place];  Service: General;  Laterality: N/A;   ROUX-EN-Y GASTRIC BYPASS  09/20/2019   in [place]    TREATMENT FISTULA ANAL         Home Medications    Prior to Admission  medications   Medication Sig Start Date End Date Taking? Authorizing Provider  valACYclovir (VALTREX) 1000 MG tablet Take 1 tablet (1,000 mg total) by mouth 3 (three) times daily for 7 days. 03/10/21 03/17/21 Yes Francene Finders, PA-C  acetaminophen (TYLENOL) 325 MG tablet Take 2 tablets (650 mg total) by mouth every 6 (six) hours as needed for mild pain. 10/12/19   Meuth, Brooke A, PA-C  Cetirizine HCl 10 MG CAPS Take 1 capsule (10 mg total) by mouth daily for 10 days. 06/13/20 06/23/20  Wieters, Hallie C, PA-C  gabapentin (NEURONTIN) 100 MG capsule Take 1 capsule (100 mg total) by mouth 3 (three) times daily. 12/02/20   Jaynee Eagles, PA-C  hyoscyamine (LEVSIN SL) 0.125 MG SL tablet Place 0.125 mg under the tongue every 6 (six) hours. 09/19/19   [provider]  predniSONE (DELTASONE) 10 MG tablet Begin with 6 tabs on day 1, 5 tab on day 2, 4 tab on day 3, 3 tab on day 4, 2 tab on day 5, 1 tab on day 6-take with food Patient not taking: Reported on 10/01/2020 06/13/20   Janith Lima, PA-C    Family History History reviewed. No pertinent family history.  Social History Social History   Tobacco Use   Smoking status: Former    Types: Cigarettes    Quit date: 12/22/2008    Years since quitting: 12.2   Smokeless tobacco: Never  Substance Use Topics   Alcohol use: No     Allergies   Patient has no known allergies.   Review of Systems Review of Systems  Constitutional:  Negative for chills and fever.  Eyes:  Negative for discharge and redness.  Respiratory:  Negative for shortness of breath.   Skin:  Positive for color change, rash and wound.  Neurological:  Negative for numbness.    Physical Exam Triage Vital Signs ED Triage Vitals [03/10/21 1529]  Enc Vitals Group     BP      Pulse      Resp      Temp      Temp src      SpO2      Weight      Height      Head Circumference      Peak Flow      Pain Score 6     Pain Loc      Pain Edu?      Excl. in [place]?    No data  found.  Updated Vital Signs There were no vitals taken for this visit.      Physical Exam Vitals and nursing note reviewed.  Constitutional:      General: He is not in acute distress.    Appearance: Normal appearance. He is not ill-appearing.  HENT:     Head: Normocephalic and atraumatic.  Eyes:     Conjunctiva/sclera: Conjunctivae normal.  Cardiovascular:     Rate and Rhythm: Normal rate.  Pulmonary:     Effort: Pulmonary effort is normal.  Skin:    Comments: Few erythematous papular lesions to above right upper lateral lip- does not cross midline  Neurological:     Mental Status: He is alert.  Psychiatric:        Mood and Affect: Mood normal.        Behavior: Behavior normal.        Thought Content: Thought content normal.     UC Treatments / Results  Labs (all labs ordered are listed, but only abnormal results are displayed) Labs Reviewed - No data to display  EKG   Radiology No results found.  Procedures Procedures (including critical care time)  Medications Ordered in UC Medications - No data to display  Initial Impression / Assessment and Plan / UC Course  I have reviewed the triage vital signs and the nursing notes.  Pertinent labs & imaging results that were available during my care of the patient were reviewed by me and considered in my medical decision making (see chart for details).   Given similar presentation to prior episode of shingles will treat to cover same with valtrex. Recommended follow up if no improvement or if symptoms worsen in any way.   Final Clinical Impressions(s) / UC Diagnoses   Final diagnoses:  Herpes zoster without complication   Discharge Instructions   None    ED Prescriptions     Medication Sig Dispense Auth. Provider   valACYclovir (VALTREX) 1000 MG tablet Take 1 tablet (1,000 mg total) by mouth 3 (three) times daily for 7 days. 21 tablet Francene Finders, PA-C      PDMP not reviewed this encounter.   Francene Finders, PA-C 03/10/21 1628

## 2021-03-10 NOTE — ED Triage Notes (Signed)
Had shingles in September. Now feeling same pain on right side of face intermittently. Says the red spots on his upper lips started this morning

## 2021-08-12 ENCOUNTER — Ambulatory Visit: Payer: Self-pay | Admitting: Emergency Medicine

## 2021-11-12 ENCOUNTER — Ambulatory Visit (INDEPENDENT_AMBULATORY_CARE_PROVIDER_SITE_OTHER): Payer: Self-pay | Admitting: Emergency Medicine

## 2021-11-12 ENCOUNTER — Encounter: Payer: Self-pay | Admitting: Emergency Medicine

## 2021-11-12 VITALS — BP 110/72 | HR 64 | Temp 97.9°F | Ht 70.0 in | Wt 233.1 lb

## 2021-11-12 DIAGNOSIS — Z130 Encounter for screening for diseases of the blood and blood-forming organs and certain disorders involving the immune mechanism: Secondary | ICD-10-CM

## 2021-11-12 DIAGNOSIS — Z7689 Persons encountering health services in other specified circumstances: Secondary | ICD-10-CM

## 2021-11-12 DIAGNOSIS — Z13220 Encounter for screening for lipoid disorders: Secondary | ICD-10-CM

## 2021-11-12 DIAGNOSIS — Z13228 Encounter for screening for other metabolic disorders: Secondary | ICD-10-CM

## 2021-11-12 DIAGNOSIS — Z1329 Encounter for screening for other suspected endocrine disorder: Secondary | ICD-10-CM

## 2021-11-12 DIAGNOSIS — Z1159 Encounter for screening for other viral diseases: Secondary | ICD-10-CM

## 2021-11-12 DIAGNOSIS — Z0000 Encounter for general adult medical examination without abnormal findings: Secondary | ICD-10-CM

## 2021-11-12 LAB — CBC WITH DIFFERENTIAL/PLATELET
Basophils Absolute: 0 10*3/uL (ref 0.0–0.1)
Basophils Relative: 0.1 % (ref 0.0–3.0)
Eosinophils Absolute: 0.1 10*3/uL (ref 0.0–0.7)
Eosinophils Relative: 1.9 % (ref 0.0–5.0)
HCT: 41.5 % (ref 39.0–52.0)
Hemoglobin: 14.7 g/dL (ref 13.0–17.0)
Lymphocytes Relative: 37.5 % (ref 12.0–46.0)
Lymphs Abs: 1.4 10*3/uL (ref 0.7–4.0)
MCHC: 35.3 g/dL (ref 30.0–36.0)
MCV: 86.7 fl (ref 78.0–100.0)
Monocytes Absolute: 0.3 10*3/uL (ref 0.1–1.0)
Monocytes Relative: 9.1 % (ref 3.0–12.0)
Neutro Abs: 1.9 10*3/uL (ref 1.4–7.7)
Neutrophils Relative %: 51.4 % (ref 43.0–77.0)
Platelets: 155 10*3/uL (ref 150.0–400.0)
RBC: 4.79 Mil/uL (ref 4.22–5.81)
RDW: 13.1 % (ref 11.5–15.5)
WBC: 3.6 10*3/uL — ABNORMAL LOW (ref 4.0–10.5)

## 2021-11-12 LAB — LIPID PANEL
Cholesterol: 173 mg/dL (ref 0–200)
HDL: 43.3 mg/dL (ref >39.00)
LDL Cholesterol: 98 mg/dL (ref 0–99)
NonHDL: 129.7
Total CHOL/HDL Ratio: 4
Triglycerides: 160 mg/dL — ABNORMAL HIGH (ref 0.0–149.0)
VLDL: 32 mg/dL (ref 0.0–40.0)

## 2021-11-12 LAB — COMPREHENSIVE METABOLIC PANEL
ALT: 21 U/L (ref 0–53)
AST: 18 U/L (ref 0–37)
Albumin: 4.2 g/dL (ref 3.5–5.2)
Alkaline Phosphatase: 78 U/L (ref 39–117)
BUN: 12 mg/dL (ref 6–23)
CO2: 30 mEq/L (ref 19–32)
Calcium: 9.2 mg/dL (ref 8.4–10.5)
Chloride: 104 mEq/L (ref 96–112)
Creatinine, Ser: 0.99 mg/dL (ref 0.40–1.50)
GFR: 91.94 mL/min (ref >60.00)
Glucose, Bld: 86 mg/dL (ref 70–99)
Potassium: 3.8 mEq/L (ref 3.5–5.1)
Sodium: 140 mEq/L (ref 135–145)
Total Bilirubin: 0.7 mg/dL (ref 0.2–1.2)
Total Protein: 7.2 g/dL (ref 6.0–8.3)

## 2021-11-12 LAB — HEMOGLOBIN A1C: Hgb A1c MFr Bld: 5.2 % (ref 4.6–6.5)

## 2021-11-12 NOTE — Progress Notes (Signed)
Brad Hughes Smoker 46 y.o.   Chief Complaint  Patient presents with   New Patient (Initial Visit)    No concerns     HISTORY OF PRESENT ILLNESS: This is a 46 y.o. male first visit to this office, here to establish care with me. Healthy male with a healthy lifestyle. Physical work.  Non-smoker. No chronic medical problems.  No chronic medications. Family history of diabetes. No complaints or medical concerns today. History of shingles earlier this year.  HPI   Prior to Admission medications   Medication Sig Start Date End Date Taking? Authorizing Provider  acetaminophen (TYLENOL) 325 MG tablet Take 2 tablets (650 mg total) by mouth every 6 (six) hours as needed for mild pain. 10/12/19  Yes Meuth, Brooke A, PA-C  Cetirizine HCl 10 MG CAPS Take 1 capsule (10 mg total) by mouth daily for 10 days. 06/13/20 06/23/20  Wieters, Hallie C, PA-C    No Known Allergies  There are no problems to display for this patient.   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     Past Surgical History:  Procedure Laterality Date   ANAL FISTULOTOMY N/A 12/26/2015   Procedure: FISTULOTOMY;  Surgeon: Romie Levee, MD;  Location: [facility];  Service: General;  Laterality: N/A;   IR RADIOLOGIST EVAL & MGMT  10/24/2019   LAPAROSCOPY N/A 09/23/2019   Procedure: LAPAROSCOPY DIAGNOSTIC, EGD, Drainage of intra-abdominal abscess;  Surgeon: Harriette Bouillon, MD;  Location: [facility];  Service: General;  Laterality: N/A;   RECTAL EXAM UNDER ANESTHESIA  2015   RECTAL EXAM UNDER ANESTHESIA N/A 12/26/2015   Procedure: RECTAL EXAM UNDER ANESTHESIA;  Surgeon: Romie Levee, MD;  Location: [facility] [place];  Service: General;  Laterality: N/A;   ROUX-EN-Y GASTRIC BYPASS  09/20/2019   in  [place]    TREATMENT FISTULA ANAL      Social History   Socioeconomic History   Marital status: Married    Spouse name: Not on file   Number of children: Not on file   Years of education: Not on file   Highest education level: Not on file  Occupational History   Not on file  Tobacco Use   Smoking status: Former    Types: Cigarettes    Quit date: 12/22/2008    Years since quitting: 12.8   Smokeless tobacco: Never  Substance and Sexual Activity   Alcohol use: No   Drug use: Not on file   Sexual activity: Not on file  Other Topics Concern   Not on file  Social History Narrative   ** Merged History Encounter **       Social Determinants of Health   Financial Resource Strain: Not on file  Food Insecurity: No Food Insecurity (11/16/2019)   Hunger Vital Sign    Worried About Running Out of Food in the Last Year: Never true    Ran Out of Food in the Last Year: Never true  Transportation Needs: Not on file  Physical Activity: Not on file  Stress: Not on file  Social Connections: Not on file  Intimate Partner Violence: Not on file    History reviewed. No pertinent family history.   Review of Systems  Constitutional: Negative.  Negative for chills and fever.  HENT: Negative.  Negative for congestion and sore throat.   Respiratory: Negative.  Negative for cough and shortness of breath.   Cardiovascular: Negative.  Negative for chest pain and palpitations.  Gastrointestinal:  Negative for abdominal pain, diarrhea, nausea and vomiting.  Genitourinary: Negative.   Musculoskeletal: Negative.   Skin: Negative.  Negative for rash.  Neurological: Negative.  Negative for dizziness and headaches.  All other systems reviewed and are negative.  Vitals:   11/12/21 0832  BP: 110/72  Pulse: 64  Temp: 97.9 F (36.6 C)  SpO2: 97%     Physical Exam Vitals reviewed.  Constitutional:      Appearance: Normal appearance.  HENT:     Head: Normocephalic.     Right Ear: Tympanic  membrane, ear canal and external ear normal.     Left Ear: Tympanic membrane, ear canal and external ear normal.     Mouth/Throat:     Mouth: Mucous membranes are moist.     Pharynx: Oropharynx is clear.  Eyes:     Extraocular Movements: Extraocular movements intact.     Conjunctiva/sclera: Conjunctivae normal.     Pupils: Pupils are equal, round, and reactive to light.  Cardiovascular:     Rate and Rhythm: Normal rate and regular rhythm.     Pulses: Normal pulses.     Heart sounds: Normal heart sounds.  Pulmonary:     Effort: Pulmonary effort is normal.     Breath sounds: Normal breath sounds.  Abdominal:     General: Bowel sounds are normal.     Palpations: Abdomen is soft.     Tenderness: There is no abdominal tenderness.  Musculoskeletal:     Cervical back: No tenderness.     Right lower leg: No edema.     Left lower leg: No edema.  Lymphadenopathy:     Cervical: No cervical adenopathy.  Skin:    General: Skin is warm and dry.     Capillary Refill: Capillary refill takes less than 2 seconds.  Neurological:     General: No focal deficit present.     Mental Status: He is alert and oriented to person, place, and time.  Psychiatric:        Mood and Affect: Mood normal.        Behavior: Behavior normal.      ASSESSMENT & PLAN: Problem List Items Addressed This Visit   None Visit Diagnoses     Routine general medical examination at a health care facility    -  Primary   Need for hepatitis C screening test       Relevant Orders   Hepatitis C antibody screen   Screening for deficiency anemia       Relevant Orders   CBC with Differential   Screening for lipoid disorders       Relevant Orders   Lipid panel   Screening for endocrine, metabolic and immunity disorder       Relevant Orders   Comprehensive metabolic panel   Hemoglobin A1c   Encounter to establish care          Modifiable risk factors discussed with patient. Anticipatory guidance according to age  provided. The following topics were also discussed: Social Determinants of Health Smoking.  Non-smoker Diet and nutrition Benefits of exercise Cancer family history review Vaccinations recommendations Cardiovascular risk assessment and need for blood work Mental health including depression and anxiety Fall and accident prevention  Patient Instructions  Mantenimiento de Radiographer, therapeutic en los [employer],  Male Adoptar un estilo de vida saludable y recibir atencin preventiva son importantes para promover la salud y Counsellor. Consulte al mdico sobre: El esquema adecuado para hacerse pruebas y exmenes peridicos. Cosas que puede hacer por su cuenta para prevenir enfermedades y [place] sano. Qu debo saber sobre la dieta, el peso y el ejercicio? Consuma una dieta saludable  Consuma una dieta que incluya muchas verduras, frutas, productos lcteos con bajo contenido de [place] y Associate Professor. No consuma muchos alimentos ricos en grasas slidas, azcares agregados o sodio. Mantenga un peso saludable El ndice de masa muscular [facility]) es una medida que puede utilizarse para identificar posibles problemas de [place]. Proporciona una estimacin de la grasa corporal basndose en el peso y la altura. Su mdico puede ayudarle a Engineer, site IMC y a Personnel officer o Pharmacologist un peso saludable. Haga ejercicio con regularidad Haga ejercicio con regularidad. Esta es una de las prcticas ms importantes que puede hacer por su salud. La [employer] de los adultos deben seguir estas pautas: Education officer, environmental, al menos, 150 minutos de actividad fsica por semana. El ejercicio debe aumentar la frecuencia cardaca y Media planner transpirar (ejercicio de intensidad moderada). Hacer ejercicios de fortalecimiento por lo [employer] por semana. Agregue esto a su plan de ejercicio de intensidad moderada. Pase menos tiempo sentado. Incluso la actividad fsica ligera puede ser beneficiosa. Controle sus niveles de  colesterol y lpidos en la sangre Comience a realizarse anlisis de lpidos y Oncologist en la sangre a los 20 aos y luego reptalos cada 5 aos. Es posible que Insurance underwriter los niveles de colesterol con mayor frecuencia si: Sus niveles de lpidos y colesterol son altos. Es mayor de 40 aos. Presenta un alto riesgo de padecer enfermedades cardacas. Qu debo saber sobre las pruebas de deteccin del cncer? Muchos tipos de cncer pueden detectarse de manera temprana y, a menudo, pueden prevenirse. Segn su historia clnica y sus antecedentes familiares, es posible que deba realizarse pruebas de deteccin del cncer en diferentes edades. Esto puede incluir pruebas de deteccin de lo siguiente: Building services engineer. Cncer de prstata. Cncer de piel. Cncer de pulmn. Qu debo saber sobre la enfermedad cardaca, la diabetes y la hipertensin arterial? Presin arterial y enfermedad cardaca La hipertensin arterial causa enfermedades cardacas y [place] el riesgo de accidente cerebrovascular. Es ms probable que esto se manifieste en las personas que tienen lecturas de presin arterial alta o tienen sobrepeso. Hable con el mdico sobre sus valores de presin arterial deseados. Hgase controlar la presin arterial: Cada 3 a 5 aos si tiene entre 18 y 47 aos. Todos los aos si es mayor de 40 aos. Si tiene entre 65 y 62 aos y es fumador o Insurance underwriter, pregntele al mdico si debe realizarse una prueba de deteccin de aneurisma artico abdominal (AAA) por nica vez. Diabetes Realcese exmenes de deteccin de la diabetes con regularidad. Este anlisis revisa el nivel de azcar en la sangre en [place]. Hgase las pruebas de deteccin: Cada tres aos despus de los 45 aos de edad si tiene un peso normal y un bajo riesgo de padecer diabetes. Con ms frecuencia y a partir de [place] edad inferior si tiene sobrepeso o un alto riesgo de padecer diabetes. Qu debo saber sobre la prevencin de  infecciones? Hepatitis B Si tiene un riesgo ms alto de contraer hepatitis B, debe someterse a un examen de deteccin de este virus. Hable con el mdico para averiguar si tiene riesgo de contraer la infeccin por hepatitis B. Hepatitis C  Se recomienda un anlisis de [place] para: Todos los que nacieron entre 1945 y [phone]. Todas las personas que tengan un riesgo de haber contrado hepatitis C. Enfermedades de transmisin sexual (ETS) Debe realizarse pruebas de deteccin de ITS todos los aos, incluidas la gonorrea y la clamidia, si: Es sexualmente activo y es menor de [address]. Es mayor de [address], y Public affairs consultant informa que corre riesgo de tener este tipo de infecciones. La actividad sexual ha cambiado desde que le hicieron la ltima prueba de deteccin y tiene un riesgo mayor de Warehouse manager clamidia o Copy. Pregntele al mdico si usted tiene riesgo. Pregntele al mdico si usted tiene un alto riesgo de Primary school teacher VIH. El mdico tambin puede recomendarle un medicamento recetado para ayudar a evitar la infeccin por el VIH. Si elige tomar medicamentos para prevenir el VIH, primero debe [employer] de deteccin del VIH. Luego debe hacerse anlisis cada 3 meses mientras est tomando los medicamentos. Siga estas indicaciones en su casa: Consumo de alcohol No beba alcohol si el mdico se lo prohbe. Si bebe alcohol: Limite la cantidad que consume de 0 a 2 bebidas por da. Sepa cunta cantidad de alcohol hay en las bebidas que toma. En los [address], una medida equivale a una botella de cerveza de 12 oz (355 ml), un vaso de vino de 5 oz (148 ml) o un vaso de una bebida alcohlica de alta graduacin de 1 oz (44 ml). Estilo de vida No consuma ningn producto que contenga nicotina o tabaco. Estos productos incluyen cigarrillos, tabaco para Theatre manager y aparatos de vapeo, como los Administrator, Civil Service. Si necesita ayuda para dejar de consumir estos productos, consulte al mdico. No consuma  drogas. No comparta agujas. Solicite ayuda a su mdico si necesita apoyo o informacin para abandonar las drogas. Indicaciones generales Realcese los estudios de rutina de [address], dentales y de Wellsite geologist. Mantngase al da con las vacunas. Infrmele a su mdico si: Se siente deprimido con frecuencia. Alguna vez ha sido vctima de [place] o no se siente seguro en su casa. Resumen Adoptar un estilo de vida saludable y recibir atencin preventiva son importantes para promover la salud y Counsellor. Siga las instrucciones del mdico acerca de una dieta saludable, el ejercicio y la realizacin de pruebas o exmenes para Hotel manager. Siga las instrucciones del mdico con respecto al control del colesterol y la presin arterial. Esta informacin no tiene Theme park manager el consejo del mdico. Asegrese de hacerle al mdico cualquier pregunta que tenga. Document Revised: 07/31/2020 Document Reviewed: 07/31/2020 Elsevier Patient Education  2023 Elsevier Inc.     Edwina Barth, MD [facility]

## 2021-11-12 NOTE — Patient Instructions (Signed)
Mantenimiento de la salud en los hombres Health Maintenance, Male Adoptar un estilo de vida saludable y recibir atencin preventiva son importantes para promover la salud y el bienestar. Consulte al mdico sobre: El esquema adecuado para hacerse pruebas y exmenes peridicos. Cosas que puede hacer por su cuenta para prevenir enfermedades y mantenerse sano. Qu debo saber sobre la dieta, el peso y el ejercicio? Consuma una dieta saludable  Consuma una dieta que incluya muchas verduras, frutas, productos lcteos con bajo contenido de grasa y protenas magras. No consuma muchos alimentos ricos en grasas slidas, azcares agregados o sodio. Mantenga un peso saludable El ndice de masa muscular (IMC) es una medida que puede utilizarse para identificar posibles problemas de peso. Proporciona una estimacin de la grasa corporal basndose en el peso y la altura. Su mdico puede ayudarle a determinar su IMC y a lograr o mantener un peso saludable. Haga ejercicio con regularidad Haga ejercicio con regularidad. Esta es una de las prcticas ms importantes que puede hacer por su salud. La mayora de los adultos deben seguir estas pautas: Realizar, al menos, 150 minutos de actividad fsica por semana. El ejercicio debe aumentar la frecuencia cardaca y hacerlo transpirar (ejercicio de intensidad moderada). Hacer ejercicios de fortalecimiento por lo menos dos veces por semana. Agregue esto a su plan de ejercicio de intensidad moderada. Pase menos tiempo sentado. Incluso la actividad fsica ligera puede ser beneficiosa. Controle sus niveles de colesterol y lpidos en la sangre Comience a realizarse anlisis de lpidos y colesterol en la sangre a los 20 aos y luego reptalos cada 5 aos. Es posible que necesite controlar los niveles de colesterol con mayor frecuencia si: Sus niveles de lpidos y colesterol son altos. Es mayor de 40 aos. Presenta un alto riesgo de padecer enfermedades cardacas. Qu debo  saber sobre las pruebas de deteccin del cncer? Muchos tipos de cncer pueden detectarse de manera temprana y, a menudo, pueden prevenirse. Segn su historia clnica y sus antecedentes familiares, es posible que deba realizarse pruebas de deteccin del cncer en diferentes edades. Esto puede incluir pruebas de deteccin de lo siguiente: Cncer colorrectal. Cncer de prstata. Cncer de piel. Cncer de pulmn. Qu debo saber sobre la enfermedad cardaca, la diabetes y la hipertensin arterial? Presin arterial y enfermedad cardaca La hipertensin arterial causa enfermedades cardacas y aumenta el riesgo de accidente cerebrovascular. Es ms probable que esto se manifieste en las personas que tienen lecturas de presin arterial alta o tienen sobrepeso. Hable con el mdico sobre sus valores de presin arterial deseados. Hgase controlar la presin arterial: Cada 3 a 5 aos si tiene entre 18 y 39 aos. Todos los aos si es mayor de 40 aos. Si tiene entre 65 y 75 aos y es fumador o sola fumar, pregntele al mdico si debe realizarse una prueba de deteccin de aneurisma artico abdominal (AAA) por nica vez. Diabetes Realcese exmenes de deteccin de la diabetes con regularidad. Este anlisis revisa el nivel de azcar en la sangre en ayunas. Hgase las pruebas de deteccin: Cada tres aos despus de los 45 aos de edad si tiene un peso normal y un bajo riesgo de padecer diabetes. Con ms frecuencia y a partir de una edad inferior si tiene sobrepeso o un alto riesgo de padecer diabetes. Qu debo saber sobre la prevencin de infecciones? Hepatitis B Si tiene un riesgo ms alto de contraer hepatitis B, debe someterse a un examen de deteccin de este virus. Hable con el mdico para averiguar si tiene riesgo de   contraer la infeccin por hepatitis B. Hepatitis C Se recomienda un anlisis de sangre para: Todos los que nacieron entre 1945 y 1965. Todas las personas que tengan un riesgo de haber  contrado hepatitis C. Enfermedades de transmisin sexual (ETS) Debe realizarse pruebas de deteccin de ITS todos los aos, incluidas la gonorrea y la clamidia, si: Es sexualmente activo y es menor de 24 aos. Es mayor de 24 aos, y el mdico le informa que corre riesgo de tener este tipo de infecciones. La actividad sexual ha cambiado desde que le hicieron la ltima prueba de deteccin y tiene un riesgo mayor de tener clamidia o gonorrea. Pregntele al mdico si usted tiene riesgo. Pregntele al mdico si usted tiene un alto riesgo de contraer VIH. El mdico tambin puede recomendarle un medicamento recetado para ayudar a evitar la infeccin por el VIH. Si elige tomar medicamentos para prevenir el VIH, primero debe hacerse los anlisis de deteccin del VIH. Luego debe hacerse anlisis cada 3 meses mientras est tomando los medicamentos. Siga estas indicaciones en su casa: Consumo de alcohol No beba alcohol si el mdico se lo prohbe. Si bebe alcohol: Limite la cantidad que consume de 0 a 2 bebidas por da. Sepa cunta cantidad de alcohol hay en las bebidas que toma. En los [place], una medida equivale a una botella de cerveza de 12 oz (355 ml), un vaso de vino de 5 oz (148 ml) o un vaso de una bebida alcohlica de alta graduacin de 1 oz (44 ml). Estilo de vida No consuma ningn producto que contenga nicotina o tabaco. Estos productos incluyen cigarrillos, tabaco para mascar y aparatos de vapeo, como los cigarrillos electrnicos. Si necesita ayuda para dejar de consumir estos productos, consulte al mdico. No consuma drogas. No comparta agujas. Solicite ayuda a su mdico si necesita apoyo o informacin para abandonar las drogas. Indicaciones generales Realcese los estudios de rutina de la salud, dentales y de la vista. Mantngase al da con las vacunas. Infrmele a su mdico si: Se siente deprimido con frecuencia. Alguna vez ha sido vctima de maltrato o no se siente seguro en su  casa. Resumen Adoptar un estilo de vida saludable y recibir atencin preventiva son importantes para promover la salud y el bienestar. Siga las instrucciones del mdico acerca de una dieta saludable, el ejercicio y la realizacin de pruebas o exmenes para detectar enfermedades. Siga las instrucciones del mdico con respecto al control del colesterol y la presin arterial. Esta informacin no tiene como fin reemplazar el consejo del mdico. Asegrese de hacerle al mdico cualquier pregunta que tenga. Document Revised: 07/31/2020 Document Reviewed: 07/31/2020 Elsevier Patient Education  2023 Elsevier Inc.  

## 2021-11-13 LAB — HEPATITIS C ANTIBODY: Hepatitis C Ab: NONREACTIVE

## 2022-11-16 ENCOUNTER — Encounter: Payer: Self-pay | Admitting: Emergency Medicine
